# Patient Record
Sex: Female | Born: 1935 | Race: White | Hispanic: No | Marital: Married | Smoking: Never smoker
Health system: Southern US, Community
[De-identification: ages and names within clinical notes are randomized; demographics above are authoritative.]

## PROBLEM LIST (undated history)

## (undated) DIAGNOSIS — R569 Unspecified convulsions: Secondary | ICD-10-CM

## (undated) DIAGNOSIS — F329 Major depressive disorder, single episode, unspecified: Secondary | ICD-10-CM

## (undated) DIAGNOSIS — F419 Anxiety disorder, unspecified: Secondary | ICD-10-CM

## (undated) DIAGNOSIS — Z9289 Personal history of other medical treatment: Secondary | ICD-10-CM

## (undated) DIAGNOSIS — H3530 Unspecified macular degeneration: Secondary | ICD-10-CM

## (undated) DIAGNOSIS — R059 Cough, unspecified: Secondary | ICD-10-CM

## (undated) DIAGNOSIS — M1990 Unspecified osteoarthritis, unspecified site: Secondary | ICD-10-CM

## (undated) DIAGNOSIS — I10 Essential (primary) hypertension: Secondary | ICD-10-CM

## (undated) DIAGNOSIS — K219 Gastro-esophageal reflux disease without esophagitis: Secondary | ICD-10-CM

## (undated) DIAGNOSIS — K5790 Diverticulosis of intestine, part unspecified, without perforation or abscess without bleeding: Secondary | ICD-10-CM

## (undated) DIAGNOSIS — R05 Cough: Secondary | ICD-10-CM

## (undated) DIAGNOSIS — I739 Peripheral vascular disease, unspecified: Secondary | ICD-10-CM

## (undated) DIAGNOSIS — F32A Depression, unspecified: Secondary | ICD-10-CM

## (undated) DIAGNOSIS — G629 Polyneuropathy, unspecified: Secondary | ICD-10-CM

## (undated) DIAGNOSIS — M2550 Pain in unspecified joint: Secondary | ICD-10-CM

## (undated) DIAGNOSIS — G5790 Unspecified mononeuropathy of unspecified lower limb: Secondary | ICD-10-CM

## (undated) DIAGNOSIS — I2510 Atherosclerotic heart disease of native coronary artery without angina pectoris: Secondary | ICD-10-CM

## (undated) DIAGNOSIS — Z8709 Personal history of other diseases of the respiratory system: Secondary | ICD-10-CM

## (undated) DIAGNOSIS — I779 Disorder of arteries and arterioles, unspecified: Secondary | ICD-10-CM

## (undated) DIAGNOSIS — E785 Hyperlipidemia, unspecified: Secondary | ICD-10-CM

## (undated) HISTORY — DX: Atherosclerotic heart disease of native coronary artery without angina pectoris: I25.10

## (undated) HISTORY — DX: Unspecified mononeuropathy of unspecified lower limb: G57.90

## (undated) HISTORY — DX: Essential (primary) hypertension: I10

## (undated) HISTORY — PX: CARDIAC CATHETERIZATION: SHX172

## (undated) HISTORY — DX: Disorder of arteries and arterioles, unspecified: I77.9

## (undated) HISTORY — PX: COLONOSCOPY: SHX174

## (undated) HISTORY — PX: PARTIAL HYSTERECTOMY: SHX80

## (undated) HISTORY — DX: Hyperlipidemia, unspecified: E78.5

## (undated) HISTORY — DX: Peripheral vascular disease, unspecified: I73.9

## (undated) HISTORY — PX: CATARACT EXTRACTION: SUR2

## (undated) HISTORY — PX: CORONARY ANGIOPLASTY: SHX604

---

## 1999-03-13 ENCOUNTER — Encounter: Payer: Self-pay | Admitting: *Deleted

## 1999-03-13 ENCOUNTER — Inpatient Hospital Stay (HOSPITAL_COMMUNITY): Admission: AD | Admit: 1999-03-13 | Discharge: 1999-03-17 | Payer: Self-pay | Admitting: *Deleted

## 2000-02-01 ENCOUNTER — Inpatient Hospital Stay (HOSPITAL_COMMUNITY): Admission: EM | Admit: 2000-02-01 | Discharge: 2000-02-03 | Payer: Self-pay | Admitting: *Deleted

## 2000-02-03 ENCOUNTER — Encounter: Payer: Self-pay | Admitting: *Deleted

## 2002-09-27 HISTORY — PX: KNEE ARTHROPLASTY: SHX992

## 2003-09-06 ENCOUNTER — Inpatient Hospital Stay (HOSPITAL_BASED_OUTPATIENT_CLINIC_OR_DEPARTMENT_OTHER): Admission: RE | Admit: 2003-09-06 | Discharge: 2003-09-06 | Payer: Self-pay | Admitting: *Deleted

## 2004-04-13 ENCOUNTER — Inpatient Hospital Stay (HOSPITAL_COMMUNITY): Admission: RE | Admit: 2004-04-13 | Discharge: 2004-04-17 | Payer: Self-pay | Admitting: Neurological Surgery

## 2004-09-30 ENCOUNTER — Ambulatory Visit: Payer: Self-pay | Admitting: Cardiology

## 2004-10-02 ENCOUNTER — Encounter: Admission: RE | Admit: 2004-10-02 | Discharge: 2004-10-02 | Payer: Self-pay | Admitting: Neurological Surgery

## 2004-10-23 ENCOUNTER — Ambulatory Visit: Payer: Self-pay | Admitting: Cardiology

## 2004-11-06 ENCOUNTER — Ambulatory Visit: Payer: Self-pay | Admitting: Cardiology

## 2005-04-15 ENCOUNTER — Ambulatory Visit: Payer: Self-pay | Admitting: Cardiology

## 2005-11-19 ENCOUNTER — Ambulatory Visit: Payer: Self-pay | Admitting: Cardiology

## 2006-12-15 ENCOUNTER — Ambulatory Visit: Payer: Self-pay | Admitting: Cardiology

## 2006-12-15 ENCOUNTER — Encounter: Payer: Self-pay | Admitting: Physician Assistant

## 2006-12-27 ENCOUNTER — Encounter: Payer: Self-pay | Admitting: Cardiology

## 2007-03-28 ENCOUNTER — Ambulatory Visit: Payer: Self-pay | Admitting: Cardiology

## 2007-05-09 ENCOUNTER — Ambulatory Visit: Payer: Self-pay | Admitting: Cardiology

## 2008-02-29 ENCOUNTER — Ambulatory Visit: Payer: Self-pay | Admitting: Cardiology

## 2008-03-05 ENCOUNTER — Ambulatory Visit: Payer: Self-pay | Admitting: Cardiology

## 2008-07-24 ENCOUNTER — Ambulatory Visit: Payer: Self-pay | Admitting: Cardiology

## 2008-09-27 HISTORY — PX: KIDNEY SURGERY: SHX687

## 2008-11-06 ENCOUNTER — Encounter: Admission: RE | Admit: 2008-11-06 | Discharge: 2008-11-06 | Payer: Self-pay | Admitting: Neurological Surgery

## 2008-11-20 ENCOUNTER — Ambulatory Visit: Payer: Self-pay | Admitting: Cardiology

## 2009-02-26 ENCOUNTER — Encounter: Payer: Self-pay | Admitting: Cardiology

## 2009-03-07 ENCOUNTER — Encounter: Payer: Self-pay | Admitting: Cardiology

## 2009-05-23 ENCOUNTER — Encounter (INDEPENDENT_AMBULATORY_CARE_PROVIDER_SITE_OTHER): Payer: Self-pay | Admitting: *Deleted

## 2009-05-23 DIAGNOSIS — I10 Essential (primary) hypertension: Secondary | ICD-10-CM | POA: Insufficient documentation

## 2009-05-23 DIAGNOSIS — I2510 Atherosclerotic heart disease of native coronary artery without angina pectoris: Secondary | ICD-10-CM | POA: Insufficient documentation

## 2009-05-23 DIAGNOSIS — E785 Hyperlipidemia, unspecified: Secondary | ICD-10-CM | POA: Insufficient documentation

## 2009-05-23 DIAGNOSIS — I739 Peripheral vascular disease, unspecified: Secondary | ICD-10-CM | POA: Insufficient documentation

## 2009-08-18 ENCOUNTER — Telehealth (INDEPENDENT_AMBULATORY_CARE_PROVIDER_SITE_OTHER): Payer: Self-pay | Admitting: *Deleted

## 2009-11-19 ENCOUNTER — Encounter: Payer: Self-pay | Admitting: Cardiology

## 2009-11-24 ENCOUNTER — Ambulatory Visit: Payer: Self-pay | Admitting: Cardiology

## 2010-07-16 ENCOUNTER — Ambulatory Visit: Payer: Self-pay | Admitting: Cardiology

## 2010-07-20 ENCOUNTER — Telehealth (INDEPENDENT_AMBULATORY_CARE_PROVIDER_SITE_OTHER): Payer: Self-pay | Admitting: *Deleted

## 2010-08-15 ENCOUNTER — Encounter: Payer: Self-pay | Admitting: Physician Assistant

## 2010-08-18 ENCOUNTER — Telehealth (INDEPENDENT_AMBULATORY_CARE_PROVIDER_SITE_OTHER): Payer: Self-pay | Admitting: *Deleted

## 2010-09-04 ENCOUNTER — Encounter (INDEPENDENT_AMBULATORY_CARE_PROVIDER_SITE_OTHER): Payer: Self-pay | Admitting: *Deleted

## 2010-09-04 ENCOUNTER — Ambulatory Visit: Payer: Self-pay | Admitting: Cardiology

## 2010-09-08 ENCOUNTER — Encounter: Payer: Self-pay | Admitting: Physician Assistant

## 2010-09-10 ENCOUNTER — Ambulatory Visit: Payer: Self-pay | Admitting: Cardiology

## 2010-09-30 ENCOUNTER — Encounter (INDEPENDENT_AMBULATORY_CARE_PROVIDER_SITE_OTHER): Payer: Self-pay | Admitting: *Deleted

## 2010-10-27 NOTE — Assessment & Plan Note (Signed)
Summary: 6 mo fu pr sept reminder-srs   Visit Type:  Follow-up Primary Provider:  Dr. Lizbeth Bark   History of Present Illness: the patient is an 77 old female with a history of coronary artery disease, status post stent of right coronary artery in 2000.she also had normal LV function and a negative cardiolite  study in 2008. From a cardiovascular standpoint she has been doing quite well. She was placed on Imdur during her last office visit and has noticed no recurrent chest pain. She has no shortness of breath on exertion she is very active in her household andhas  to take care of her husband with severe dementia. She has no palpitations or syncope. She reports no other cardiovascular related problems.  Preventive Screening-Counseling & Management  Alcohol-Tobacco     Smoking Status: never  Current Medications (verified): 1)  Lisinopril 10 Mg Tabs (Lisinopril) .... Take 1 Tablet By Mouth Once A Day 2)  Lovastatin 20 Mg Tabs (Lovastatin) .... Take One Tablet By Mouth Daily At Bedtime 3)  Isosorbide Mononitrate Cr 60 Mg Xr24h-Tab (Isosorbide Mononitrate) .... Take 1 Tablet By Mouth Once A Day  (Place On File) 4)  Metoprolol Succinate 25 Mg Xr24h-Tab (Metoprolol Succinate) .... Take 1/2 Tablet By Mouth Two Times A Day 5)  Aspirin Ec 325 Mg Tbec (Aspirin) .... Take One Tablet By Mouth Daily 6)  Prilosec 20 Mg Cpdr (Omeprazole) .... Take 1 Tablet By Mouth Once A Day 7)  Tegretol Xr 200 Mg Xr12h-Tab (Carbamazepine) .... Take 1 Tablet By Mouth Once A Day 8)  Ibuprofen 200 Mg Tabs (Ibuprofen) .... As Needed 9)  Nitroglycerin 0.4 Mg Subl (Nitroglycerin) .... One Tablet Under Tongue Every 5 Minutes As Needed For Chest Pain---May Repeat Times Three 10)  Vitamin B-12 250 Mcg Tabs (Cyanocobalamin) .... Take 1 Tablet By Mouth Once A Day 11)  Multivitamins  Tabs (Multiple Vitamin) .... Take 1 Tablet By Mouth Once A Day 12)  Chlorthalidone 25 Mg Tabs (Chlorthalidone) .... Take 1 Tablet By Mouth Once A  Day  Allergies (verified): No Known Drug Allergies  Comments:  Nurse/Medical Assistant: The patient's medication list and allergies were reviewed with the patient and were updated in the Medication and Allergy Lists.  Past History:  Family History: Last updated: 05/23/2009 Family History of Diabetes:  Family History of Hypertension:   Social History: Last updated: 05/23/2009 Tobacco Use - No.  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    Vital Signs:  Patient profile:   77 year old female Height:      63 inches Weight:      181 pounds BMI:     32.18 Pulse rate:   69 / minute BP sitting:  176 / 81  (left arm) Cuff size:   regular  Vitals Entered By: Georgina Peer (July 16, 2010 8:23 AM)  Serial Vital Signs/Assessments:  Time      Position  BP       Pulse  Resp  Temp     By 8:26 AM             160/72   69                    Georgina Peer  Comments: 8:26 AM right arm/reg  cuff By: Georgina Peer    Physical Exam  Additional Exam:  General: Well-developed, well-nourished in no distress head: Normocephalic and atraumatic eyes PERRLA/EOMI intact, conjunctiva and lids normal nose: No deformity or lesions mouth normal dentition, normal posterior pharynx neck: Supple, no JVD.  No masses, thyromegaly or abnormal cervical nodes lungs: Normal breath sounds bilaterally without wheezing.  Normal percussion heart: regular rate and rhythm with normal S1 and S2, no S3 or S4.  PMI is normal.  No pathological murmurs abdomen: Normal bowel sounds, abdomen is soft and nontender without masses, organomegaly or hernias noted.  No hepatosplenomegaly musculoskeletal: Back normal, normal gait muscle strength and tone normal pulsus: Pulse is normal in all 4 extremities Extremities: No peripheral pitting edema neurologic: Alert and oriented x 3 skin: Intact without lesions or rashes cervical nodes: No significant adenopathy psychologic: Normal affect    Impression & Recommendations:  Problem # 1:  CAD, NATIVE VESSEL (ICD-414.01) the patient has no recurrent chest pain. She has done well and Imdur. There is no indication for further stress testing. Her updated medication list for this problem includes:    Lisinopril 10 Mg Tabs (Lisinopril) .Marland Kitchen... Take 1 tablet by mouth once a day    Isosorbide Mononitrate Cr 60 Mg Xr24h-tab (Isosorbide mononitrate) .Marland Kitchen... Take 1 tablet by mouth once a day  (place on file)    Metoprolol Succinate 25 Mg Xr24h-tab (Metoprolol succinate) .Marland Kitchen... Take 1/2 tablet by mouth two times a day    Aspirin Ec 325 Mg Tbec (Aspirin) .Marland Kitchen... Take one tablet by mouth daily    Nitroglycerin 0.4 Mg Subl (Nitroglycerin) ..... One tablet under tongue every 5 minutes as needed for chest pain---may repeat times three  Problem # 2:  HYPERLIPIDEMIA-MIXED (ICD-272.4) patient has blood work done with her primary care physician. Including LFTs and lipid management is  handled by her primary care physician Her updated medication list for this problem includes:    Lovastatin 20 Mg Tabs (Lovastatin) .Marland Kitchen... Take one tablet by mouth daily at bedtime  Problem # 3:  HYPERTENSION, UNSPECIFIED (ICD-401.9) blood pressure remains elevated. I discussed with the patient that it's important to add antihypertensive therapy. I will start her on chlorthalidone 25 mg p.o. q. daily Her updated medication list for this problem includes:    Lisinopril 10 Mg Tabs (Lisinopril) .Marland Kitchen... Take 1 tablet by mouth once a day    Metoprolol Succinate 25 Mg Xr24h-tab (Metoprolol succinate) .Marland Kitchen... Take 1/2 tablet by mouth two times a day    Aspirin Ec 325 Mg Tbec (Aspirin) .Marland Kitchen... Take one tablet by mouth daily    Chlorthalidone 25 Mg Tabs (Chlorthalidone) .Marland Kitchen... Take 1 tablet by mouth once a day  Patient Instructions: 1)  Chlorthalidone 25mg  daily 2)  Follow up in  6 months Prescriptions: CHLORTHALIDONE 25 MG TABS (CHLORTHALIDONE) Take 1 tablet by mouth once a day  #30 x 6   Entered by:   Lovina Reach, LPN  Authorized by:   Terald Sleeper, MD, [facility]   Signed by:   Lovina Reach, LPN on [id number]   Method used:   Electronically to        [facility] (retail)       [address] [place]  [address]       Ph: [phone]       Fax: [phone]   [id number]   Handout requested.

## 2010-10-27 NOTE — Letter (Signed)
Summary: Adult nurse at [facility]. [address]   [place], [place] [address]   Phone: [phone]  Fax: [phone]      [place]  Your doctor has ordered a Cardiolite Stress Test to help determine the condition of your heart during stress. If you take blood pressure medicine, ask your doctor if you should take it the day of your test. You should not have anything to eat or drink at least 4 hours before your test is scheduled, and no caffeine (coffee, tea, decaf. or chocolate) for 24 hours before your test.   You will need to register at the Outpatient/Main Entrance at the hospital 30 minutes before your appointment time. It is a good idea to bring a copy of your order with you. They will direct you to the Diagnostic Imaging (Radiology) Department.  You will be asked to undress from the waist up and be given a hospital gown to wear, so dress comfortably from the waist down, for example:    Sweat pants, shorts or skirt   Rubber-soled lace up shoes (i.e. tennis shoes)  Plan on about three hours from registration to release from the hospital.    Date of Test:              Time of Test              Hold metoprolol (Toprol) for 24 hours before test. You may take your remaining medications with water the morning of your test.

## 2010-10-27 NOTE — Progress Notes (Signed)
Summary: ER visit - poss. need echo  Phone Note Call from Patient   Summary of Call: Had ER visit for left arm pain.  Wanted her to come back for stress test on Monday, but when she called [facility] no one knew anything about this.  Stated  that she would just wait to see [place] before deciding if she needed. Will scan in ER notes.  Initial call taken by: Lovina Reach, LPN,  November 22, [id number] 2:51 PM

## 2010-10-27 NOTE — Progress Notes (Signed)
Summary: vomiting  Phone Note Call from Patient Call back at Home Phone [phone]   Action Taken: Patient advised to go to ER Summary of Call: Pt states MD gave new prescription at office visit on 10/20. She states she thinks it's making her sick on her stomach. She states she takes aroudn 9am. She states in the afternoon she gets sick to her stomach and starts vomiting. She states didn't vomit Sat but she didn't do anything. She states she layed around and didn't do anything. She states yesterday she started vomiting in the afternoon. Also taking a medicine, Pramipexole, for restless leg syndrome now. She states she started this medication the same day as the Chlortalidone. She states she is taking both of these medications in the mornings. She will also d/w primary MD.  Initial call taken by: Gurney Maxin, RN, BSN,  July 20, 2010 4:01 PM  Follow-up for Phone Call        Unlikely chlortalidone  cause. Suspect Mirapex is cause, commenely associated with N/V Follow-up by: Terald Sleeper, MD, [facility],  July 20, 2010 5:12 PM  Additional Follow-up for Phone Call Additional follow up Details #1::        Pt notified and verbalized understanding.  Additional Follow-up by: Gurney Maxin, RN, BSN,  July 21, 2010 9:28 AM

## 2010-10-27 NOTE — Assessment & Plan Note (Signed)
Summary: ED f/u -  wanted echo done  --agh   Visit Type:  Follow-up Primary Provider:  Dr. Lizbeth Bark   History of Present Illness: patient seen in clinic, following recent ED visit.  She presented with LUE discomfort, a new symptom for her, which had been ongoing for one week. It was not strictly correlated with exertion. There was no associated anterior chest discomfort. 2 sets of enzymes normal; EKG indicated first-degree AV block with no acute changes. Chest x-ray was benign.  Medication adjustments were made, but patient reports no recurrent symptoms since her visit. She denies any significant dyspnea. She was, however, concerned about this arm pain, which did not extend beyond the elbow. She denied any recent trauma. 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  Preventive Screening-Counseling & Management  Alcohol-Tobacco     Smoking Status: never  Current Medications (verified): 1)  Lisinopril 10 Mg Tabs (Lisinopril) .... Take 1 Tablet By Mouth Once A Day 2)  Lovastatin 20 Mg Tabs (Lovastatin) .... Take One Tablet By Mouth Daily At Bedtime 3)  Isosorbide Mononitrate Cr 60 Mg Xr24h-Tab (Isosorbide Mononitrate) .... Take 1 Tablet By Mouth Once A Day  (Place On File) 4)  Metoprolol Succinate 25 Mg Xr24h-Tab (Metoprolol Succinate) .... Take 1/2 Tablet By Mouth Two Times A Day 5)  Aspirin Ec 325 Mg Tbec (Aspirin) .... Take One Tablet By Mouth Daily 6)  Prilosec 20 Mg Cpdr (Omeprazole) .... Take 1 Tablet By Mouth Once A Day 7)  Tegretol Xr 200 Mg Xr12h-Tab (Carbamazepine) .... Take 1 Tablet By Mouth Once A Day 8)  Ibuprofen 200 Mg Tabs (Ibuprofen) .... As Needed 9)  Nitroglycerin 0.4 Mg Subl (Nitroglycerin) .... One Tablet Under Tongue Every 5 Minutes As Needed For Chest Pain---May Repeat Times Three 10)  Vitamin B-12 250 Mcg Tabs (Cyanocobalamin) .... Take 1 Tablet By Mouth Once A Day 11)  Multivitamins  Tabs (Multiple Vitamin) .... Take 1 Tablet By Mouth  Once A Day 12)  Chlorthalidone 25 Mg Tabs (Chlorthalidone) .... Take 1 Tablet By Mouth Once A Day  Allergies (verified): No Known Drug Allergies  Comments:  Nurse/Medical Assistant: The patient is currently on medications but does not know the name or dosage at this time. Instructed to contact our office with details. Will update medication list at that time.  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 All other systems negative.   Vital Signs:  Patient profile:   77 year old female Height:      63 inches Weight:      181 pounds Pulse rate:   76 / minute BP sitting:   146 / 72  (left arm) Cuff size:   regular  Vitals Entered By: Georgina Peer (September 04, 2010 1:08 PM)  Physical Exam  Additional Exam:  GEN: 77 year old female, no distress HEENT: NCAT,PERRLA,EOMI NECK: palpable pulses, no bruits; no JVD; no TM LUNGS: CTA bilaterally HEART: RRR (S1S2); no significant murmurs; no rubs; no gallops ABD: soft, NT; intact BS EXT:  intact distal pulses; no edema SKIN: warm, dry MUSC: no obvious deformity NEURO: A/O (x3)   Job   Impression & Recommendations:  Problem # 1:  CAD, NATIVE VESSEL (ICD-414.01)  Will schedule an exercise stress Cardiolite next week, to rule out occult ischemia. She recently presented with new LUE discomfort, possible anginal equivalent. This was dissimilar from her  presenting symptoms in 2000, when she underwent stenting of the RCA. She has history of normal LV EF, and had a negative Cardiolite in 2008. We'll arrange for early clinic follow up with myself and Dr. Dannielle Burn. Of note, we will hold beta blocker in a.m. of test, to insure adequate heart rate.  Problem # 2:  HYPERLIPIDEMIA-MIXED (ICD-272.4)  aggressive lipid management recommended with target LDL 70 or less, if feasible  Her updated medication list for this problem includes:    Lovastatin 20 Mg Tabs (Lovastatin) .Marland Kitchen... Take one tablet by mouth daily at bedtime  Problem # 3:  HYPERTENSION, UNSPECIFIED (ICD-401.9) Assessment: Comment Only  Problem # 4:  PVD (ICD-443.9)  history of occluded right vertebral artery  Other Orders: Nuclear Med ([place])  Patient Instructions: 1)  Follow up with Dr. Lutricia Feil on Thursday, Sep 10, 2010 at 10:45 am. 2)  Your physician has requested that you have an exercise stress cardiolite.  For further information please visit [email or web address].  Please follow instruction sheet, as given. 3)  Your physician recommends that you continue on your current medications as directed. Please refer to the Current Medication list given to you today.  Appended Document: [facility]     Allergies: No Known Drug Allergies   Other Orders: T-Lipid Profile [id number]) T-Hepatic Function [phone])  Patient Instructions: 1)  Follow up with Dr. Lutricia Feil on Thursday, September 10, 2010 at 2:30pm. 2)  Your physician has requested that you have an exercise stress cardiolite.  For further information please visit [email or web address].  Please follow instruction sheet, as given. 3)  Your physician recommends that you go to the [facility] for a FASTING lipid profile and liver function labs:  Do not eat or drink after midnight.

## 2010-10-27 NOTE — Assessment & Plan Note (Signed)
Summary: 1 yr fu per feb reminder-srs   Visit Type:  Follow-up Primary Provider:  Dr. Lizbeth Bark  CC:  follow-up visit.  History of Present Illness: the patient is unremarkable 77 year old female with coronary artery disease, status post stent to the right coronary artery in 2000. She has normal LV function and had a negative Cardiolite study in 2008.  The patient has been bothered by cold symptoms for the last 3 weeks and has been on multiple antibiotics. She continues to complain of hoarseness and frequent coughing spells. Associated with this she has experienced some soreness in the chest, but without any frank exertional chest pain. The patient did say that on occasion she took nitroglycerin which seemed to improve her symptoms but she's not sure if there would have resolved spontaneously. She also reports of some pain that shoots up in her neck intermittently.  The patient also is status post partial nephrectomy for what appears to be a complicated renal cyst although it's not clear if she had a malignancy. She still is complaining of some pain in the right flank.  Preventive Screening-Counseling & Management  Alcohol-Tobacco     Smoking Status: never  Clinical Review Panels:  CXR CXR results Cardiac leads project over the chest.  Heart size is         within normal limits for portable technique and stable.         Atherosclerotic calcification of the thoracic aortic arch.         Thoracic aortic contour is within normal limits and stable.         Pulmonary vascularity is normal.  Mild prominence of interstitial         lung markings is stable.  No focal airspace disease, effusion,         edema, or pneumothorax is identified.  Mild pleural thickening at         the right lung apex is stable.                   IMPRESSION:         Stable appearance of the chest.  No evidence of acute         cardiopulmonary disease. (07/13/2008)  Echocardiogram Echocardiogram 1. The estimated  ejection fraction is 55-60%.          2. Mild concentric left ventricular hypertrophy is observed.         3. There is mitral annular calcification.         4. There is mild mitral regurgitation.          5. The pericardium appears normal.         6. The inferior vena cava appears normal. (03/05/2008)  Carotid Studies Carotid Doppler Results 1. Very minimal atherosclerotic plaque at the left carotid         bulb and proximal internal carotid artery.  No hemodynamically         significant stenosis. (03/29/2007)  Vascular Studies Venous Doppler There is complete compressibility of the left common         femoral, superficial femoral, and popliteal veins.  There is         respiratory phasicity and augmentation of flow upon calf         compression on the Doppler exam.                   IMPRESSION:         No evidence of  DVT in the left lower extremity. (03/28/2008)  Cardiac Imaging Cardiac Cath Findings  IMPRESSION:  1. Normal left ventricular systolic function.  2. Patent stent in the right coronary artery.  There is disease in the     ostium of the right coronary artery which is borderline severity.  There     is mild, but nonobstructive disease in the left coronary arteries.   PLAN:  We will proceed with an outpatient adenosine Cardiolite to rule out  ischemia in the right coronary artery distribution.  If there is ischemia on  the scan, the right coronary artery could be treated percutaneously.  If  there is no ischemia on the scan, would recommend medical therapy.                                             Junious Silk, M.D. [facility] (09/06/2003)    Current Medications (verified): 1)  Lisinopril 10 Mg Tabs (Lisinopril) .... Take 1 Tablet By Mouth Once A Day 2)  Lovastatin 20 Mg Tabs (Lovastatin) .... Take One Tablet By Mouth Daily At Bedtime 3)  Isosorbide Mononitrate Cr 60 Mg Xr24h-Tab (Isosorbide Mononitrate) .... Take 1 Tablet By Mouth Once A Day  (Place On File) 4)   Metoprolol Succinate 50 Mg Xr24h-Tab (Metoprolol Succinate) .... Take 1/2 Tablet By Mouth Two Times A Day 5)  Aspirin Ec 325 Mg Tbec (Aspirin) .... Take One Tablet By Mouth Daily 6)  Prilosec 20 Mg Cpdr (Omeprazole) .... Take 1 Tablet By Mouth Once A Day 7)  Tegretol Xr 200 Mg Xr12h-Tab (Carbamazepine) .... Take 1 Tablet By Mouth Once A Day 8)  Ibuprofen 200 Mg Tabs (Ibuprofen) .... As Needed 9)  Nitroglycerin 0.4 Mg Subl (Nitroglycerin) .... One Tablet Under Tongue Every 5 Minutes As Needed For Chest Pain---May Repeat Times Three  Allergies (verified): No Known Drug Allergies  Comments:  Nurse/Medical Assistant: The patient's medications were reviewed with the patient and were updated in the Medication List. Pt brought a list of medications to office visit. Pt is not sure about dose of Lisinopril but will call if this is incorrect. Gurney Maxin, RN, BSN (November 24, 2009 2:17 PM)  Past History:  Family History: Last updated: 05/23/2009 Family History of Diabetes:  Family History of Hypertension:   Social History: Last updated: 05/23/2009 Tobacco Use - No.  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  The patient denies fatigue, malaise, fever, weight gain/loss, vision loss, decreased hearing, hoarseness, palpitations, shortness of breath, prolonged cough, wheezing, sleep apnea, coughing up blood, abdominal pain, blood in stool, nausea, vomiting,  diarrhea, heartburn, incontinence, blood in urine, muscle weakness, joint pain, leg swelling, rash, skin lesions, headache, fainting, dizziness, depression, anxiety, enlarged lymph nodes, easy bruising or bleeding, and environmental allergies.    Vital Signs:  Patient profile:   77 year old female Height:      63 inches Weight:  173.75 pounds Pulse rate:   76 / minute BP sitting:   168 / 67  (left arm) Cuff size:   regular  Vitals Entered By: Gurney Maxin, RN, BSN (November 24, 2009 2:08 PM) CC: follow-up visit   Physical Exam  Additional Exam:  General: Well-developed, well-nourished in no distress head: Normocephalic and atraumatic eyes PERRLA/EOMI intact, conjunctiva and lids normal nose: No deformity or lesions mouth normal dentition, normal posterior pharynx neck: Supple, no JVD.  No masses, thyromegaly or abnormal cervical nodes lungs: Normal breath sounds bilaterally without wheezing.  Normal percussion heart: regular rate and rhythm with normal S1 and S2, no S3 or S4.  PMI is normal.  No pathological murmurs abdomen: Normal bowel sounds, abdomen is soft and nontender without masses, organomegaly or hernias noted.  No hepatosplenomegaly musculoskeletal: Back normal, normal gait muscle strength and tone normal pulsus: Pulse is normal in all 4 extremities Extremities: No peripheral pitting edema neurologic: Alert and oriented x 3 skin: Intact without lesions or rashes cervical nodes: No significant adenopathy psychologic: Normal affect    Impression & Recommendations:  Problem # 1:  CAD, NATIVE VESSEL (ICD-414.01) the patient status post prior coronary intervention. The patient does report some chest pain. She also has neck pain. There are both typical and atypical features to her pain. Her EKG shows no acute changes. I recommended for the patient to increase her Imdur to 60 mg a day and use p.r.n. nitroglycerin. However if she has unstable symptoms she will need to  notify us or come to the emergency room and she has been made aware of this. Her updated medication list for this problem includes:    Lisinopril 10 Mg Tabs (Lisinopril) .Marland Kitchen... Take 1 tablet by mouth once a day    Isosorbide Mononitrate Cr 60 Mg Xr24h-tab (Isosorbide mononitrate) .Marland Kitchen... Take 1 tablet by mouth once a day  (place on file)    Metoprolol Succinate 50 Mg Xr24h-tab (Metoprolol succinate) .Marland Kitchen... Take 1/2 tablet by mouth two times a day    Aspirin Ec 325 Mg Tbec (Aspirin) .Marland Kitchen... Take one tablet by mouth daily    Nitroglycerin 0.4 Mg Subl (Nitroglycerin) ..... One tablet under tongue every 5 minutes as needed for chest pain---may repeat times three  Orders: EKG w/ Interpretation (93000) EKG w/ Interpretation (93000)  Problem # 2:  HYPERTENSION, UNSPECIFIED (ICD-401.9) blood pressure slightly elevated. With increasedImdur but may also need to increase lisinopril blood pressure remains elevated Her updated medication list for this problem includes:    Lisinopril 10 Mg Tabs (Lisinopril) .Marland Kitchen... Take 1 tablet by mouth once a day    Metoprolol Succinate 50 Mg Xr24h-tab (Metoprolol succinate) .Marland Kitchen... Take 1/2 tablet by mouth two times a day    Aspirin Ec 325 Mg Tbec (Aspirin) .Marland Kitchen... Take one tablet by mouth daily  Problem # 3:  HYPERLIPIDEMIA-MIXED (ICD-272.4) the patient is on a statin Her updated medication list for this problem includes:    Lovastatin 20 Mg Tabs (Lovastatin) .Marland Kitchen... Take one tablet by mouth daily at bedtime  Patient Instructions: 1)  Increase Imdur to 60mg  daily 2)  Follow up in  6 months.   Prescriptions: ISOSORBIDE MONONITRATE CR 60 MG XR24H-TAB (ISOSORBIDE MONONITRATE) Take 1 tablet by mouth once a day  (place on file)  #30 x 6   Entered by:   Lovina Reach, LPN   Authorized by:   Terald Sleeper, MD, [facility]   Signed by:   Lovina Reach, LPN on [id number]   Method used:  Electronically to        [facility] (retail)       [address]       [place]  [address]       Ph: [phone]       Fax: [phone]   RxID:   [id number]

## 2010-10-29 NOTE — Assessment & Plan Note (Signed)
Summary: 1 WK F/U PER 12/9 OV W/GENE-JM   Visit Type:  Follow-up Primary Provider:  Dr. Lizbeth Bark   History of Present Illness: the patient is a very pleasant 77 year old female with a history of coronary stent placement in 2000 and a follow up cardiac catheterization 2005 nonobstructive coronary artery disease. She was recently seen in the office with some atypical chest pain and left upper arm pain. She was referred for Cardiolite study which was within normal limits. The patient has been doing well she denies any chest pain orthopnea PND. She is on a lot of stress because her husband who has significant dementia. She denies any palpitations or syncope and is otherwise stable from a cardiovascular perspective  Preventive Screening-Counseling & Management  Alcohol-Tobacco     Smoking Status: never  Current Medications (verified): 1)  Lisinopril 10 Mg Tabs (Lisinopril) .... Take 1 Tablet By Mouth Once A Day 2)  Lovastatin 20 Mg Tabs (Lovastatin) .... Take One Tablet By Mouth Daily At Bedtime 3)  Isosorbide Mononitrate Cr 60 Mg Xr24h-Tab (Isosorbide Mononitrate) .... Take 1 Tablet By Mouth Once A Day  (Place On File) 4)  Metoprolol Succinate 25 Mg Xr24h-Tab (Metoprolol Succinate) .... Take 1/2 Tablet By Mouth Two Times A Day 5)  Aspirin Ec 325 Mg Tbec (Aspirin) .... Take One Tablet By Mouth Daily 6)  Prilosec 20 Mg Cpdr (Omeprazole) .... Take 1 Tablet By Mouth Once A Day 7)  Tegretol Xr 200 Mg Xr12h-Tab (Carbamazepine) .... Take 1 Tablet By Mouth Once A Day 8)  Ibuprofen 200 Mg Tabs (Ibuprofen) .... As Needed 9)  Nitroglycerin 0.4 Mg Subl (Nitroglycerin) .... One Tablet Under Tongue Every 5 Minutes As Needed For Chest Pain---May Repeat Times Three 10)  Vitamin B-12 250 Mcg Tabs (Cyanocobalamin) .... Take 1 Tablet By Mouth Once A Day 11)  Multivitamins  Tabs (Multiple Vitamin) .... Take 1 Tablet By Mouth Once A Day 12)  Chlorthalidone 25 Mg Tabs (Chlorthalidone) .... Take 1 Tablet By Mouth  Once A Day  Allergies (verified): No Known Drug Allergies  Comments:  Nurse/Medical Assistant: The patient is currently on medications but does not know the name or dosage at this time. Instructed to contact our office with details. Will update medication list at that time.  Past History:  Family History: Last updated: 05/23/2009 Family History of Diabetes:  Family History of Hypertension:   Social History: Last updated: 05/23/2009 Tobacco Use - No.  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 Pulmonary nodules Exercise Cardiolite study 4.6 mets, normal LV perfusion with an ejection fraction of 78% September 08, 2010 Triglycerides 251 cholesterol 191 HDL cholesterol 45 LDL cholesterol 96 September 08, 2010    Review of Systems  The patient denies fatigue, malaise, fever, weight gain/loss, vision loss, decreased hearing, hoarseness, chest pain, palpitations, shortness of breath, prolonged cough, wheezing, sleep apnea, coughing up blood, abdominal pain, blood in stool, nausea, vomiting, diarrhea, heartburn, incontinence, blood in urine, muscle weakness, joint pain, leg swelling, rash, skin lesions, headache, fainting, dizziness, depression, anxiety, enlarged lymph nodes, easy bruising or bleeding, and environmental allergies.    Vital Signs:  Patient profile:  77 year old female Height:      63 inches Weight:      180 pounds Pulse rate:   85 / minute BP sitting:   148 / 75  (left arm) Cuff size:   regular  Vitals Entered By:  Georgina Peer (September 10, 2010 2:19 PM)  Physical Exam  Additional Exam:  GEN: 77 year old female, no distress HEENT: NCAT,PERRLA,EOMI NECK: palpable pulses, no bruits; no JVD; no TM LUNGS: CTA bilaterally HEART: RRR (S1S2); no significant murmurs; no rubs; no gallops ABD: soft, NT; intact BS EXT: intact distal pulses; no edema SKIN: warm, dry MUSC: no obvious deformity NEURO: A/O (x3)   Job   Impression & Recommendations:  Problem # 1:  CAD, NATIVE VESSEL (ICD-414.01) stable no recurrent chest pain. Cardiolite study within normal limits Her updated medication list for this problem includes:    Lisinopril 10 Mg Tabs (Lisinopril) .Marland Kitchen... Take 1 tablet by mouth once a day    Isosorbide Mononitrate Cr 60 Mg Xr24h-tab (Isosorbide mononitrate) .Marland Kitchen... Take 1 tablet by mouth once a day  (place on file)    Metoprolol Succinate 25 Mg Xr24h-tab (Metoprolol succinate) .Marland Kitchen... Take 1/2 tablet by mouth two times a day    Aspirin Ec 325 Mg Tbec (Aspirin) .Marland Kitchen... Take one tablet by mouth daily    Nitroglycerin 0.4 Mg Subl (Nitroglycerin) ..... One tablet under tongue every 5 minutes as needed for chest pain---may repeat times three  Problem # 2:  HYPERLIPIDEMIA-MIXED (ICD-272.4) lipid panel at goal with an LDL of 96 continue lovastatin at current dose. Her updated medication list for this problem includes:    Lovastatin 20 Mg Tabs (Lovastatin) .Marland Kitchen... Take one tablet by mouth daily at bedtime  Problem # 3:  HYPERTENSION, UNSPECIFIED (ICD-401.9) blood pressure well controlled systolic blood pressure slightly high, but at this point I do not think further intervention is needed. This is just a single reading. Her updated medication list for this problem includes:    Lisinopril 10 Mg Tabs (Lisinopril) .Marland Kitchen... Take 1 tablet by mouth once a day    Metoprolol Succinate 25 Mg Xr24h-tab (Metoprolol succinate) .Marland Kitchen... Take 1/2 tablet by mouth two times a day    Aspirin Ec 325 Mg Tbec (Aspirin) .Marland Kitchen... Take one  tablet by mouth daily    Chlorthalidone 25 Mg Tabs (Chlorthalidone) .Marland Kitchen... Take 1 tablet by mouth once a day  Patient Instructions: 1)  Your physician recommends that you continue on your current medications as directed. Please refer to the Current Medication list given to you today. 2)  Follow up in  6 months

## 2010-10-29 NOTE — Letter (Signed)
Summary: Risk analyst at [facility]. [address]   [place], [place] [address]   Phone: [phone]  Fax: [phone]        September 30, 2010 MRN: [id number]   Carrie Gillespie [address] [place]  [address]   Dear Ms. Brant,  Your test ordered by Rande Lawman has been reviewed by your physician (or physician assistant) and was found to be normal or stable. Your physician (or physician assistant) felt no changes were needed at this time.  ____ Echocardiogram  ____ Cardiac Stress Test  __X__ Lab Work  ____ Peripheral vascular study of arms, legs or neck  ____ CT scan or X-ray  ____ Lung or Breathing test  ____ Other:   Thank you.   Lovina Reach, LPN    Bryon Lions, M.D., F.A.C.C. Maceo Pro, M.D., F.A.C.C. Cammy Copa, M.D., F.A.C.C. Vonda Antigua, M.D., F.A.C.C. Vita Barley, M.D., F.A.C.C. Mare Ferrari, M.D., F.A.C.C. Emi Belfast, PA-C

## 2010-12-26 ENCOUNTER — Other Ambulatory Visit: Payer: Self-pay | Admitting: Cardiology

## 2011-02-09 NOTE — Assessment & Plan Note (Signed)
[facility]                          [facility] OFFICE NOTE   Carrie Gillespie, Carrie Gillespie                       MRN:          [id number]  DATE:11/20/2008                            DOB:          October 16, 1919    REFERRING PHYSICIAN:  Wynelle Cleveland   HISTORY OF PRESENT ILLNESS:  The patient is a very pleasant 77 year old  female with coronary artery disease status post stent to the right  coronary artery in 2000.  The patient has been doing extremely well.  Her exercise tolerance has improved.  She has been diagnosed with  seizures and is currently on Tegretol.  She also had last year a fall  and has had problems with pain in the hips for which she is under  treatment.  She feels much better now; however, after a steroid  injection.  From a cardiovascular standpoint, she is stable.  Her blood  pressure is high in the office and that she assures me that her blood  pressures are completely normal at home.  She is declining to have any  addition to her medical regimen.   MEDICATIONS:  1. Aspirin 325 mg p.o. daily.  2. Prilosec 20 mg p.o. daily.  3. Metoprolol 50 mg half a tablet p.o. b.i.d.  4. Isosorbide 30 mg p.o. daily.  5. Lovastatin 20 mg p.o. daily.  6. Lisinopril 5 mg p.o. daily.  7. Tegretol-XR 100 mg p.o. daily.   PHYSICAL EXAMINATION:  VITAL SIGNS:  Blood pressure 161/84, heart rate  55, weight is 179 pounds.  NECK:  Normal carotid upstroke, no carotid bruits.  LUNGS:  Clear breath sounds bilaterally.  HEART:  Regular rate and rhythm.  Normal S1, S2.  No murmur, rubs, or  gallops.  ABDOMEN:  Soft, nontender.  No rebound, no guarding.  Good bowel sounds.  EXTREMITIES:  No cyanosis, clubbing, or edema.   PROBLEMS:  1. Coronary artery disease, stable.      a.     Status post stent and percutaneous coronary intervention to       the right coronary artery in 2000.      b.     Repeat cath studies, 2005 with nonobstructive disease.      c.      Normal left ventricular function.      d.     Cardiolite study in 2008, negative for ischemia.  2. Dyslipidemia.  3. Hypertension.  According to the patient, controlled.  4. Peripheral vascular disease with minimal internal carotid artery      disease with an occluded right vertebral artery.  5. Pulmonary nodules, followed by CT scan on September 26, 2007, and      followed by Dr. Rhae Lerner.   PLAN:  1. From a cardiac standpoint, the patient is stable.  Sats have much      improved in regards to her stamina.  She tries to exercise with a      small foot bicycle.  She states that she has no chest pain or      shortness of breath.  2. The patient had a Cardiolite  done within the last 3 years, so she      does not need any further ischemia testing in the absence of      symptoms currently.     Carrie Mcmurray, MD,FACC     GED/MedQ  DD: 11/20/2008  DT: 11/20/2008  Job #: [id number]   cc:   Wynelle Cleveland

## 2011-02-09 NOTE — Assessment & Plan Note (Signed)
[facility]                          [facility] NOTE   Carrie Gillespie, Carrie Gillespie                         MRN:          [id number]  DATE:02/29/2008                            DOB:          04/28/1920    HISTORY OF PRESENT ILLNESS:  The patient is an 77 year old female with a  history of coronary artery disease.  The patient is status post stent to  the right coronary artery in 2000.  She has been doing well from a  cardiac standpoint.  She has had no chest pain.  She does have dyspnea,  but feels she is severely deconditioned.  She was admitted recently with  pneumonia, hyponatremia, and secondary seizures.  She also had a fall a  month ago at the [employer], when she tripped.  She denies, however,  any recurrent syncope, palpitations, orthopnea, PND, or chest pain.   MEDICATIONS:  Aspirin 325 mg p.o. daily, Prilosec 20 mg p.o. daily,  metoprolol 50 mg half a tablet p.o. b.i.d., isosorbide 30 mg p.o. daily,  lovastatin 20 mg p.o. daily, and lisinopril 5 mg p.o. daily.   PHYSICAL EXAMINATION:  VITAL SIGNS:  Blood pressure 161/88, heart rate  72, and weight is 174.  GENERAL:  A well-nourished white female in no apparent distress.  HEENT:  Pupils isochoric.  Conjunctivae clear.  NECK:  Supple.  Normal carotid upstroke.  No carotid bruits.  LUNGS:  Clear breath sounds bilaterally.  HEART:  Regular rate and rhythm.  Normal S1 and S2.  No murmurs, rubs,  or gallops.  ABDOMEN:  Soft and nontender with no rebound or guarding and good bowel  sounds.  EXTREMITIES:  No cyanosis, clubbing, or edema.   PROBLEM LIST:  1. Coronary artery disease, stable.      a.     Status post stent and percutaneous coronary intervention to       the right coronary artery in 2000.      b.     Repeat catheterization in 2005 was performed with       nonobstructive disease.      c.     Normal left ventricular function 65%.      d.     No recurrence of chest pain.  2.  Dyslipidemia.  3. Hypertension, poorly controlled.  4. Peripheral vascular disease with minimal internal carotid artery      disease, but with an occluded right vertebral artery.  5. Pulmonary nodules followed by CT scan on September 26, 2007.   PLAN:  1. From a cardiac standpoint, the patient is doing well.  She does      have dyspnea.  We will order an echocardiogram study to reassess      her LV function.  2. The patient's blood pressure control is likely contributing to      chronic diastolic heart failure.      We will increase her lisinopril from 5 to 10 mg p.o. daily.  3. The patient can follow up with [place] in 6 months.  I have reviewed      with her  electrocardiogram today, which is essentially within      normal limits.     Ernestine Mcmurray, MD,FACC  Electronically Signed    GED/MedQ  DD: 02/29/2008  DT: 03/01/2008  Job #: [id number]

## 2011-02-12 NOTE — H&P (Signed)
NAMEKAMILYA, Carrie Gillespie                            ACCOUNT NO.:  [id number]   MEDICAL RECORD NO.:  [id number]                   PATIENT TYPE:  OIB   LOCATION:                                       FACILITY:  [facility]   PHYSICIAN:  Minus Breeding, M.D.                DATE OF BIRTH:  [birth year]   DATE OF ADMISSION:  09/06/2003  DATE OF DISCHARGE:                                HISTORY & PHYSICAL   CHIEF COMPLAINT:  Chest pain.   HISTORY OF PRESENT ILLNESS:  A very pleasant 77 year old white female with  coronary artery disease.  The patient had an 80% right coronary artery  lesion that was stented in 2000.  She had a repeat heart catheterization in  2001 which showed a 60% ostial right coronary artery stenosis followed by a  20 to 30% in-stent stenosis.  The LAD had a 40% proximal and 25% mid  stenosis.  The first diagonal had a 30% proximal stenosis.  Ramus  intermediate had a 40% stenosis with an EF of 60%.  Follow-up Cardiolite was  done without any evidence of ischemia.   She has done well until this last Friday night when she had sudden onset at  rest of substernal chest discomfort which was described as a heaviness, a  tightness, and a burning which radiated up into her neck. She describes it  as someone sitting on her chest.  She had associated shortness of breath  with mild diaphoresis.  Symptoms lasted about 3-1/2 hours total.  She did  not use any nitroglycerin.  She presents now and tells me that she is due to  have a total knee replacement in December in about two weeks.   I discussed this with Dr. Percival Spanish and we felt that catheterization versus  repeat nuclear study with her known coronary artery disease and risk factors  was the best option.   CURRENT MEDICATIONS:  1. Altace 5 mg daily.  2. Nexium 40 mg daily.  3. Premarin 0.625 mg daily.  4. Enteric-coated aspirin daily.  5. Lipitor 10 mg q.h.s.  6. Metoprolol 25 mg b.i.d.   PAST MEDICAL HISTORY:  Positive for  coronary artery disease as described  above, hyperlipidemia, especially hypertriglyceridemia, arteriosclerotic  peripheral vascular disease, GERD, hypertension, history of restless leg  syndrome.   ALLERGIES:  No known drug allergies.   FAMILY HISTORY:  His mother had an MI in her 75's and died at age 26.  She  had a history of diabetes.  Father died at 65 in a motor vehicle accident.  She has nine siblings who have been healthy.   SOCIAL HISTORY:  She has three children.  She lives with her husband in  [place].  She has been married for several years.  She does not smoke  tobacco or drink alcohol.   PHYSICAL EXAMINATION:  VITAL SIGNS:  Blood pressure 142/68, heart rate  is 72  and regular, weight is 170.2.  GENERAL:  An elderly, pleasant, white female who looks younger than her  stated age in no acute distress.  HEENT:  Grossly normal.  NECK:  Supple.  Carotids are equal and strong bilaterally.  She does have a  soft right carotid bruit that has been noted before.  HEART:  Rhythm is regular, normal S1 and S2 without any murmurs, rubs, or  clicks.  LUNGS:  Clear to A&P with good bilateral chest expansion.  EXTREMITIES:  Without edema.  Pedal pulses are intact.   EKG and labs are pending.   IMPRESSION:  1. Coronary artery disease as noted with previous intervention with stenting     to an 80% right coronary artery in 2000, now with one episode of chest     heaviness.  She will undergo outpatient catheterization to determine if     she has any restenosis or progression of disease and also to clear her     for upcoming left total knee replacement.  2. Hyperlipidemia, mainly in the form of hypertriglyceridemia.  3. Arteriosclerotic peripheral vascular disease with right carotid bruit.  4. Gastroesophageal reflux disease.  5. Hypertension.  6. History of restless leg syndrome.   PLAN:  She will be set up for elective outpatient catheterization with Dr.  Vicenta Aly who had done her  previous study in 2000.  The patient agrees,  understands, and accepts the risks and benefits of the explained procedure.      Otelia Limes. Kathe Mariner.                    Minus Breeding, M.D.    MRD/MEDQ  D:  09/04/2003  T:  09/04/2003  Job:  [id number]   cc:   Hardie Shackleton, M.D.

## 2011-02-12 NOTE — Discharge Summary (Signed)
[place]  Patient:    Carrie Gillespie, Carrie Gillespie                         MRN: [id number] Adm. Date:  02/01/00 Disc. Date: 02/03/00 Attending:  Nikki Dom, M.D., [facility] Dictator:   Mannie Stabile, P.A. CC:         Lodema Hong, M.D.             [place]                  Referring Physician Discharge [place]  PROCEDURE:  Cardiac catheterization May 8.  Adenosine Cardiolite May 9.  REASON FOR ADMISSION:  Patient is a 77 year old female status post stent RCA June 2000 who initially presented to [facility] for evaluation of chest pain.  Patient was treated with IV Lopressor, morphine, and sublingual nitroglycerin.  No acute changes were noted by electrocardiography.  By electrocardiography and initial cardiac enzymes were negative for MI.  Plan was to transfer to [facility] for further diagnostic evaluation.  Patient was pain-free upon arrival.  LABORATORIES:  Hemoglobin 10.9, hematocrit 31.8 with normal WBC and platelets upon arrival.  Electrolytes and renal function within normal limits.  CPK 45/0.6, troponin I less than 0.03.  Cholesterol 168, triglycerides 338, HDL 46, LDL 54, cholesterol/HDL ratio 3.7.  TSH pending.  HOSPITAL COURSE:  Following transfer from [facility], a second set of cardiac enzymes were also negative for MI.  Cardiac catheterization performed on May 8 by Dr. Elta Guadeloupe Pulsipher (see catheterization report for full details) revealed non-obstructive CAD with 20-30% in-stent restenosis at prior stent RCA site.  Specifically, there was 40% proximal, 25% mid LAD, 20% osteal GX 1, 40% RI, 60% osteal RCA with some spasm improved with NTG, 30% proximal RCA.  LV gram:  Hyperdynamic LVF (EF greater than 60%).  Distal aortogram:  25% right RAS.  Of note, patient was treated with labetalol secondary to systolic hypertension of [id number].  Dr. Vicenta Aly recommended proceeding with postcatheterization adenosine Cardiolite and  to proceed with several PCI if ischemic was noted.  Subsequent perfusion images revealed no evidence of ischemia, EF of 72%.  Medication adjustments this admission:  Altace increased to 5 mg q.12h., Lopressor increased to 50 mg q.d.  DISCHARGE MEDICATIONS: 1. Altace 5 mg q.12h. 2. Toprol XL 50 mg q.d. 3. Coated aspirin 325 mg q.d. 4. Baycol 0.2 mg q.d. 5. Premarin 0.625 mg q.d. 6. Nitrostat as directed. 7. Prevacid 30 mg q.d.  INSTRUCTIONS:  Patient is to refrain from any strenuous activity, ______ x 2 days and to maintain low fat/low cholesterol diet.  She is to call our office if there is any swelling/bleeding of the groin.  Patient will follow up with Dr. Vicenta Aly on Wednesday, May 23 at 10:15 a.m. [facility]).  She will return to Dr. Rory Percy on a p.r.n. basis.  DISCHARGE DIAGNOSES: 1. Non-ischemic chest pain.    a. Non-obstructive coronary artery disease.  Cardiac catheterization May 8.    b. Normal adenosine Cardiolite, ejection fraction of 72% May 9.    c. Status post stent mid right coronary artery June 2000, 20-30% in-stent       restenosis. 2. Hypertension. 3. Dyslipidemia. DD:  02/03/00 TD:  02/03/00 Job: [id number]

## 2011-02-12 NOTE — Assessment & Plan Note (Signed)
[facility]                          [facility] NOTE   Carrie Gillespie, Carrie Gillespie                         MRN:          [id number]  DATE:12/15/2006                            DOB:          December 19, 1919    HISTORY OF PRESENT ILLNESS:  The patient is an 77 year old female with a  history of coronary artery disease. The patient has been doing quite  well. She reports no substernal chest pain, shortness of breath,  orthopnea or PND. She does report left flank pain during deep  inspiration. She also reports symptoms of nocturnal cramping secondary  to chronic venous insufficiency. She has no orthopnea, PND, palpitations  or syncope.   MEDICATIONS:  1. Aspirin 325 daily.  2. Prilosec 20 mg p.o. daily.  3. Metoprolol 25 mg p.o. b.i.d.  4. Isosorbide 30 mg a day.  5. Lovastatin 20 mg p.o. daily.  6. Lisinopril 5 mg p.o. daily.   PHYSICAL EXAMINATION:  Blood pressure 139/72, heart rate 98 beats per  minute, weight is 171 pounds.  GENERAL:  Well-developed white female in no apparent distress.  HEENT:  Pupils are equal, conjunctivae clear.  NECK:  Supple. Normal carotid upstroke. No carotid bruits.  LUNGS:  Clear breath sounds bilaterally.  HEART:  Regular rate and rhythm. Normal sinus. No murmurs, rubs, or  gallops.  ABDOMEN:  Soft.  EXTREMITIES:  No clubbing, cyanosis, or edema.   Twelve-lead EKG:  Normal sinus rhythm, no acute changes.   PROBLEM LIST:  1. Coronary artery disease.      a.     Status post stent to the right coronary artery in 2000.      b.     Repeat catheterization in 2004 with nonobstructive disease.      c.     Left ventricular function 65%.      d.     No recurrence of sternal chest pain.  2. Dyslipidemia.  3. Hypertension, stable.  4. Peripheral vascular disease with 0 to 39% stenosis bilaterally with      an occluded right vertebral artery.  5. Two subtle pulmonary nodules by CT scan September 26, 2007. Followup  requested.   PLAN:  1. The patient will be scheduled for a followup CT scan given findings      of pulmonary nodules. This will be done in the next couple of days.  2. The patient has been instructed to avoid prolonged standing for      venous insufficiency.  3. The patient is stable from a cardiovascular perspective. No further      stress testing is indicated at the present time.     Ernestine Mcmurray, MD,FACC  Electronically Signed    GED/MedQ  DD: 12/15/2006  DT: 12/15/2006  Job #: [id number]

## 2011-02-12 NOTE — Op Note (Signed)
NAMEJESUS, CIONI NO.:  [id number]   MEDICAL RECORD NO.:  [id number]                   PATIENT TYPE:  INP   LOCATION:  2899                                 FACILITY:  [facility]   PHYSICIAN:  Earleen Newport, M.D.               DATE OF BIRTH:  1920-04-10   DATE OF PROCEDURE:  04/13/2004  DATE OF DISCHARGE:                                 OPERATIVE REPORT   PREOPERATIVE DIAGNOSIS:  L4-L5 spondylolisthesis with lumbar radiculopathy  and low back pain.   POSTOPERATIVE DIAGNOSIS:  L4-L5 spondylolisthesis with lumbar radiculopathy  and low back pain.   OPERATION PERFORMED:  Bilateral laminotomies, diskectomy, posterior lumbar  interbody arthrodesis with PEAK bone spacer and pedicle screw fixation with  posterolateral arthrodesis using local autograft and allograft.   SURGEON:  Earleen Newport, M.D.   ASSISTANT:  Hosie Spangle, M.D.   ANESTHESIA:  General endotracheal.   INDICATIONS FOR PROCEDURE:  The patient is an 77 year old individual who is  having significant back and bilateral leg pain.  She has failed extensive  efforts at conservative management and after careful consideration, we  discussed surgical intervention to include decompression arthrodesis at L4  and L5.  She was taken to the operating room for this procedure.   DESCRIPTION OF PROCEDURE:  The patient was brought to the operating room  supine on the stretcher.  After smooth induction of general endotracheal  anesthesia, she was turned prone, the back was shaved and prepped with  DuraPrep and draped in sterile fashion.  Midline incision was created and  carried down to the lumbodorsal fascia which was opened on the inside of  midline to expose the spinous  processes of L4 and L5.  These were  identified positively with a radiograph.  Then the interlaminar space was  cleared.  The facet joints were noted to be severely hypertrophied.  The  yellow ligament was also redundant and  very thickened. This tissue was taken  down and laminotomy was created removing the inferior margin of the lamina  of L4 out to the facet.  The facet was noted to be severely degenerated and  markedly loose allowing for listhesis both anteriorly and posteriorly.  Facet joint was then taken down and this allowed for good exposure of the  common dural tube, the L5 nerve root inferiorly, the L4 nerve root  superiorly.  The soft tissues were cleared from this area and the disk space  was then identified.  It was noted to be bulged posteriorly adding to  compression of the L4 foramen superiorly.  This dissection was carried out  bilaterally.  The disk space was then entered and a complete diskectomy was  carried out using a combination of Kerrison rongeurs to remove the bulk of  the disk material within the disk space.  The end plates were then cleared  using the curved [employer] osteotome  in addition to a raspatory to  removed the deep cartilaginous end plates and decorticate the bone  adequately on either end plate.  The interspace was maintained open with a  self-retaining laminar spreader which allowed for good visualization and  decompression of the disk space itself.  In the end then, the interspace was  sized for a 10 mm PEAK bone spacer.  This was inserted without too much  difficulty and the bone was packed with a combination of allograft and  autograft which was harvested from the laminectomy defects.  The space was  filled completely and in addition, PEAK spacers were placed.  Pedicle entry  sites were then chosen at L5 and L4 and these were checked radiographically  for positioning.  Then 5.5 x 40 mm screws were placed in L5 and 5.5 x 50 mm  screws were placed in L4 after tapping and then sequentially checking each  hole for any evidence of cutout.  30 mm rods were placed between the screw  caps.  Prior to doing this the lateral gutters which were previously  decorticated and  packed off separately were packed with the patient's own  bone and a mixture of cortical and cancellous bone.  Once the decompression  was completed, the interlaminar space was covered with 2 strips of Helios  bone substitute which was soaked in the patient's bone marrow aspirate that  was harvested from each of the pedicle screw entry site holes.  With this  then, the wound was carefully inspected.  The system was tightened down in  its final position and a final radiograph was obtained.  The nerve roots  were then sounded  and L4 and L5 nerve roots were each found to be free and  clear.  No dural incursions had occurred during this procedure.  The  lumbodorsal fascia was reapproximated and closed with #1 Vicryl in  interrupted fashion, 2-0 Vicryl was used in subcutaneous and subcuticular  tissues and Dermabond was placed on the skin.  The patient tolerated the  procedure well.  Blood loss was estimated at about 150 mL.                                               Earleen Newport, M.D.    Drucilla Schmidt  D:  04/13/2004  T:  04/13/2004  Job:  [id number]

## 2011-02-12 NOTE — Cardiovascular Report (Signed)
NAME:  [name], [name] NO.:  [id number]   MEDICAL RECORD NO.:  [id number]                   PATIENT TYPE:  OIB   LOCATION:  6501                                 FACILITY:  [facility]   PHYSICIAN:  Junious Silk, M.D. [facility]         DATE OF BIRTH:  08-06-1920   DATE OF PROCEDURE:  09/06/2003  DATE OF DISCHARGE:                              CARDIAC CATHETERIZATION   PROCEDURE PERFORMED:  Left heart catheterization with coronary angiography  and left ventriculography.   INDICATION:  [name] is an 77 year old woman with history of previous  stent in the right coronary artery and moderate residual coronary artery  disease.  She had a prolonged episode of substernal chest pain six days ago.  She was evaluated in the office and referred for cardiac catheterization.   PROCEDURAL NOTE:  A 5 French sheath was placed in the right femoral artery.  Coronary angiography was performed with 5 [place] JL-4 and JR-4 catheters.  Intracoronary nitroglycerin was administered in the right coronary artery to  relieve potential vasospasm.  Left ventriculography was performed with an  angled pigtail catheter.  Contrast was Omnipaque.  There were no  complications.   RESULTS:   HEMODYNAMICS:  1. Left ventricular pressure 148/24.  2. Initial aortic pressure 212/90.  This improved to 150/64 after     intravenous Labetalol.   LEFT VENTRICULOGRAM:  The left ventricle is hyperdynamic.  Left ventricular  ejection fraction is estimated at greater than 65%.  There is 2+ mitral  regurgitation which may be secondary to catheter position.   CORONARY ARTERIOGRAPHY (RIGHT DOMINANT):  Left main is normal.   Left anterior descending artery has a 40% stenosis in the proximal vessel  and a 25% stenosis in the mid vessel.  The LAD gives rise to a normal size  first diagonal which has a 40% stenosis at its origin.   Left circumflex gives rise to a very large branching ramus intermedius and  a  small obtuse marginal.  The ramus intermedius has a 30% stenosis proximally.   Right coronary artery is a dominant vessel.  There is a 70-75% stenosis in  the ostium of the right coronary artery with mild pressure damping with  catheter ___________.  This did not improve with intracoronary  nitroglycerin.  There is a 30% stenosis in the proximal vessel.  There is a  stent in the mid right coronary artery with less than 20% stenosis within  the stent.  The distal right coronary artery gives rise to a normal size  posterior descending artery and a small posterior lateral branch.   IMPRESSION:  1. Normal left ventricular systolic function.  2. Patent stent in the right coronary artery.  There is disease in the     ostium of the right coronary artery which is borderline severity.  There     is mild, but nonobstructive disease in the left coronary arteries.  PLAN:  We will proceed with an outpatient adenosine Cardiolite to rule out  ischemia in the right coronary artery distribution.  If there is ischemia on  the scan, the right coronary artery could be treated percutaneously.  If  there is no ischemia on the scan, would recommend medical therapy.                                               Junious Silk, M.D. [facility]    MWP/MEDQ  D:  09/06/2003  T:  09/06/2003  Job:  [id number]   cc:   [facility]   [facility]   Dr. Jennings Books

## 2011-02-12 NOTE — Cardiovascular Report (Signed)
[place]  Patient:    Carrie Gillespie, Carrie Gillespie                         MRN: [id number] Proc. Date: 02/02/00 Adm. Date:  [id number] Disc. Date: [id number] Attending:  Allene Dillon CC:         Rory Percy, M.D.             Allene Dillon, M.D. [place], [place]             Cardiac Catheterization Laboratory                        Cardiac Catheterization  PROCEDURES PERFORMED:  Left heart catheterization with coronary angiography and left ventriculography.  INDICATIONS:  [name] is a 77 year old woman who underwent stent placement in her mid right coronary artery approximately one year ago.  She presented after an episode of prolonged chest pain and was referred for cardiac catheterization.  DESCRIPTION OF PROCEDURE:  A 6 French sheath was placed in the right femoral artery.  Standard Judkins 6 French catheters were utilized.  Contrast was Omnipaque.  The patient was significantly hypertensive at the beginning of the procedure.  She was given 20 mg of intravenous labetalol with improvement  in her blood pressure.  She also developed nausea after the first coronary injection, which was relieved with intravenous Zofran.  There were no complications.  RESULTS:  HEMODYNAMICS:  Left ventricular pressure was 144/23.  Aortic pressure initially was 194/86 decreasing to approximately 150/70 after labetalol. There was no aortic valve gradient.  LEFT VENTRICULOGRAM:  The left ventricle was hyperdynamic with normal wall motion.  Ejection fraction is greater than 65%.  There was mitral regurgitation, which appeared to be secondary to ventricular ectopy.  ABDOMINAL AORTOGRAM:  Abdominal aortogram revealed a 25% stenosis in the right renal artery, otherwise the aortogram was unremarkable.  CORONARY ARTERIOGRAPHY:  (Right dominant).  Left main:  Left main has less than 20% stenosis in the proximal to mid vessel.  Left anterior descending:   The LAD has a 40% stenosis in the proximal vessel and a diffuse 25% stenosis in the mid vessel.  There was a small diagonal arising from the proximal LAD which has a 25% stenosis.  Left circumflex:  The left circumflex coronary gives rise to a very large branching ramus intermedius and then terminates as a small obtuse marginal branch.  There is a 40% stenosis in the proximal portion of the ramus intermedius.  Right coronary artery:  The right coronary artery has a 60% stenosis at its ostium.  There appeared to be a component of a spasm associated with this lesion that did improve with nitroglycerin.  However, after nitroglycerin there still appeared to be residual stenosis of approximately 60%.  The proximal right coronary artery had a 30% stenosis and the mid right coronary artery had a 20-30% stenosis within the stent.  There is a large posterior descending artery and three small posterolateral branches.  IMPRESSION: 1. Normal left ventricular systolic function. 2. Coronary artery disease characterized by nonobstructive disease in the    left anterior descending artery and circumflex system.  There is a patent    stent in the mid right coronary artery.  There is stenosis in the ostium    of the right coronary artery which appears to be of borderline significance  with an associated component of spasm.  PLAN:  An adenosine Cardiolite will be obtained to rule out ischemia.  If this does show significant ischemia, then the patient could be treated percutaneously. DD:  02/02/00 TD:  02/04/00 Job: [id number]

## 2011-02-12 NOTE — Discharge Summary (Signed)
Carrie Gillespie, Carrie Gillespie NO.:  [id number]   MEDICAL RECORD NO.:  [id number]                   PATIENT TYPE:  INP   LOCATION:  3019                                 FACILITY:  [place]   PHYSICIAN:  Earleen Newport, M.D.               DATE OF BIRTH:  [birth year]   DATE OF ADMISSION:  04/13/2004  DATE OF DISCHARGE:  04/17/2004                                 DISCHARGE SUMMARY   ADMISSION DIAGNOSES:  Lumbar spondylolisthesis and stenosis L4-5 with lumbar  radiculopathy.   DISCHARGE/FINAL DIAGNOSES:  Lumbar spondylolisthesis and stenosis L4-5 with  lumbar radiculopathy.   CONDITION ON DISCHARGE:  Improving.   HOSPITAL COURSE:  [name] is an 77 year old individual who has had  significant back and bilateral legs pain.  She was found to have a  degenerative spondylolisthesis at the level of L4-5. She had considerable  problems with symptoms of neurogenic claudication and radiculopathy  secondary to a tight stenosis at the L4-5 level. She was advised regarding  surgical decompression. She does have some previous cardiac history with  some concerns about her advanced age for an operation of such magnitude.  However, on July 18, she was taken to the operating room where she underwent  decompression, arthrodesis from L4-L5.   Postoperatively, she was maintained in the intensive care unit for the first  48 hours, and she seemed to tolerate things well. She did have a Vasovagal  episode during her first postoperative day. This did not repeat itself and  she gradually was able to be ambulated and mobilized without too much  difficulty.  Her incision has remained clean and dry. She is now ambulating  150 feet or more independently. She is using the walker and able to don and  doff her corset with minimal assistance.   The patient was discharged to home with Percocet #40 without refills, Valium  5 mg #30 without refills. She will be seen in the office in 2 weeks'  time  for further follow-up.                                                Earleen Newport, M.D.    Drucilla Schmidt  D:  04/17/2004  T:  04/18/2004  Job:  [id number]

## 2011-03-09 ENCOUNTER — Encounter: Payer: Self-pay | Admitting: Cardiology

## 2011-03-14 ENCOUNTER — Other Ambulatory Visit: Payer: Self-pay | Admitting: Cardiology

## 2011-03-22 ENCOUNTER — Ambulatory Visit (INDEPENDENT_AMBULATORY_CARE_PROVIDER_SITE_OTHER): Payer: Medicare Other | Admitting: Cardiology

## 2011-03-22 ENCOUNTER — Encounter: Payer: Self-pay | Admitting: Cardiology

## 2011-03-22 VITALS — BP 162/73 | HR 76 | Ht 63.0 in | Wt 182.0 lb

## 2011-03-22 DIAGNOSIS — E785 Hyperlipidemia, unspecified: Secondary | ICD-10-CM

## 2011-03-22 DIAGNOSIS — I739 Peripheral vascular disease, unspecified: Secondary | ICD-10-CM | POA: Insufficient documentation

## 2011-03-22 DIAGNOSIS — I2510 Atherosclerotic heart disease of native coronary artery without angina pectoris: Secondary | ICD-10-CM

## 2011-03-22 DIAGNOSIS — I779 Disorder of arteries and arterioles, unspecified: Secondary | ICD-10-CM | POA: Insufficient documentation

## 2011-03-22 DIAGNOSIS — I10 Essential (primary) hypertension: Secondary | ICD-10-CM

## 2011-03-22 NOTE — Assessment & Plan Note (Signed)
Blood pressure elevated in the office today but the patient has a blood pressure cuff at home and readings are typically normal. She also told that her primary care physician's office the numbers were within normal range I made no changes in her medical regimen

## 2011-03-22 NOTE — Assessment & Plan Note (Signed)
Lipid panels followed by the patient's primary care physician. Last LDL cholesterol I have on record is 96 on 09/08/2010

## 2011-03-22 NOTE — Progress Notes (Signed)
HPI  The patient is an 77 year old female with history of coronary artery disease the details as outlined below. The patient a year ago had some atypical chest pain. Cardiolite study was ordered which was within normal limits. In the interim the patient has been doing well. She denies any chest pain shortness of breath orthopnea PND. She reports no palpitations or syncope. She is doing very well from a cardiovascular perspective  Not on File  Current Outpatient Prescriptions on File Prior to Visit  Medication Sig Dispense Refill  . aspirin EC 325 MG tablet Take 325 mg by mouth daily.        . carbamazepine (TEGRETOL XR) 200 MG 12 hr tablet Take 200 mg by mouth daily.        . chlorthalidone (HYGROTON) 25 MG tablet TAKE ONE TABLET BY MOUTH ONCE EVERY DAY  30 tablet  6  . isosorbide mononitrate (IMDUR) 60 MG 24 hr tablet TAKE ONE TABLET BY MOUTH EVERY DAY  30 tablet  6  . lisinopril (PRINIVIL,ZESTRIL) 10 MG tablet Take 10 mg by mouth daily.        Marland Kitchen lovastatin (MEVACOR) 20 MG tablet Take 20 mg by mouth at bedtime.        . metoprolol succinate (TOPROL-XL) 25 MG 24 hr tablet Take 25 mg by mouth 2 (two) times daily.       . Multiple Vitamins-Minerals (MULTIVITAL) tablet Take 1 tablet by mouth daily.        . nitroGLYCERIN (NITROSTAT) 0.4 MG SL tablet Place 0.4 mg under the tongue every 5 (five) minutes as needed.        Marland Kitchen omeprazole (PRILOSEC) 20 MG capsule Take 20 mg by mouth daily.        . vitamin B-12 (CYANOCOBALAMIN) 250 MCG tablet Take 250 mcg by mouth daily.        Marland Kitchen DISCONTD: ibuprofen (ADVIL,MOTRIN) 200 MG tablet Take 200 mg by mouth as needed.          Past Medical History  Diagnosis Date  . Peripheral vascular disease, unspecified     Minimal internal carotid artery disease  . Unspecified essential hypertension   . Other and unspecified hyperlipidemia   . Coronary atherosclerosis of native coronary artery     Status post stent to right coronary 2000, catheterization 2005  nonobstructive CAD, normal LV function, Cardiolite study 2008 negative for ischemia.  . Pulmonary nodule   . Carotid artery disease     with an occluded right vertebral artery    No past surgical history on file.  Family History  Problem Relation Age of Onset  . Hypertension Other   . Diabetes Other     History   Social History  . Marital Status: Married    Spouse Name: N/A    Number of Children: N/A  . Years of Education: N/A   Occupational History  . Not on file.   Social History Main Topics  . Smoking status: Never Smoker   . Smokeless tobacco: Never Used  . Alcohol Use: No  . Drug Use: No  . Sexually Active: Not on file   Other Topics Concern  . Not on file   Social History Narrative  . No narrative on file    [id number] positives as outlined above. The remainder of the 18  point review of systems is negative  PHYSICAL EXAM BP 162/73  Pulse 76  Ht 5\' 3"  (1.6 m)  Wt 182 lb (82.555 kg)  BMI 32.24 kg/m2  General: Well-developed, well-nourished in no distress Head: Normocephalic and atraumatic Eyes:PERRLA/EOMI intact, conjunctiva and lids normal Ears: No deformity or lesions Mouth:normal dentition, normal posterior pharynx Neck: Supple, no JVD.  No masses, thyromegaly or abnormal cervical nodes Lungs: Normal breath sounds bilaterally without wheezing.  Normal percussion Cardiac: regular rate and rhythm with normal S1 and S2, no S3 or S4.  PMI is normal.  No pathological murmurs Abdomen: Normal bowel sounds, abdomen is soft and nontender without masses, organomegaly or hernias noted.  No hepatosplenomegaly MSK: Back normal, normal gait muscle strength and tone normal Vascular: Pulse is normal in all 4 extremities Extremities: No peripheral pitting edema Neurologic: Alert and oriented x 3 Skin: Intact without lesions or rashes Lymphatics: No significant adenopathy Psychologic: Normal affect   ECG: Not available  ASSESSMENT AND PLAN

## 2011-03-22 NOTE — Assessment & Plan Note (Signed)
Patient reports no chest pain. Continue current medical therapy.

## 2011-03-22 NOTE — Patient Instructions (Signed)
Your physician wants you to follow-up in: 6 months. You will receive a reminder letter in the mail one-two months in advance. If you don't receive a letter, please call our office to schedule the follow-up appointment. Your physician recommends that you continue on your current medications as directed. Please refer to the Current Medication list given to you today. 

## 2011-05-23 DIAGNOSIS — I200 Unstable angina: Secondary | ICD-10-CM

## 2011-05-23 DIAGNOSIS — I119 Hypertensive heart disease without heart failure: Secondary | ICD-10-CM

## 2011-05-23 DIAGNOSIS — I2510 Atherosclerotic heart disease of native coronary artery without angina pectoris: Secondary | ICD-10-CM

## 2011-05-24 DIAGNOSIS — I200 Unstable angina: Secondary | ICD-10-CM

## 2011-05-25 DIAGNOSIS — I2510 Atherosclerotic heart disease of native coronary artery without angina pectoris: Secondary | ICD-10-CM

## 2011-05-30 DIAGNOSIS — R079 Chest pain, unspecified: Secondary | ICD-10-CM

## 2011-06-18 ENCOUNTER — Encounter: Payer: Self-pay | Admitting: Cardiology

## 2011-06-18 ENCOUNTER — Ambulatory Visit (INDEPENDENT_AMBULATORY_CARE_PROVIDER_SITE_OTHER): Payer: Medicare Other | Admitting: Cardiology

## 2011-06-18 VITALS — BP 137/72 | HR 64 | Ht 63.0 in | Wt 177.0 lb

## 2011-06-18 DIAGNOSIS — G4700 Insomnia, unspecified: Secondary | ICD-10-CM

## 2011-06-18 DIAGNOSIS — I2510 Atherosclerotic heart disease of native coronary artery without angina pectoris: Secondary | ICD-10-CM

## 2011-06-18 DIAGNOSIS — I779 Disorder of arteries and arterioles, unspecified: Secondary | ICD-10-CM

## 2011-06-18 DIAGNOSIS — I10 Essential (primary) hypertension: Secondary | ICD-10-CM

## 2011-06-18 DIAGNOSIS — E871 Hypo-osmolality and hyponatremia: Secondary | ICD-10-CM

## 2011-06-18 MED ORDER — TRAZODONE HCL 50 MG PO TABS: 25.0000 mg | ORAL_TABLET | Freq: Every day | ORAL | Status: AC

## 2011-06-18 MED ORDER — ISOSORBIDE MONONITRATE ER 60 MG PO TB24: ORAL_TABLET | ORAL | Status: DC

## 2011-06-18 NOTE — Patient Instructions (Signed)
Your physician wants you to follow-up in: 6 months. You will receive a reminder letter in the mail one-two months in advance. If you don't receive a letter, please call our office to schedule the follow-up appointment. Increase Imdur (isosorbide) to 90 mg daily. Take 1 1/2 of your 60 mg tablets daily. Take Trazodone 25 mg (1/2 of a 50 mg tablet) 1 hour before bedtime for sleep.

## 2011-06-21 DIAGNOSIS — E871 Hypo-osmolality and hyponatremia: Secondary | ICD-10-CM | POA: Insufficient documentation

## 2011-06-21 DIAGNOSIS — G4700 Insomnia, unspecified: Secondary | ICD-10-CM | POA: Insufficient documentation

## 2011-06-21 NOTE — Assessment & Plan Note (Signed)
Patient was given a prescription of trazodone 25 mg by mouth at bedtime

## 2011-06-21 NOTE — Assessment & Plan Note (Signed)
Coronary artery disease: Stable chest pain. Increase Imdur to 90 mg daily. Given the recent negative stress test I do not think is an indication for cardiac catheterization.

## 2011-06-21 NOTE — Assessment & Plan Note (Signed)
Blood pressure well controlled

## 2011-06-21 NOTE — Progress Notes (Signed)
HPI The patient is an 77 year old female with history of coronary artery disease status post stent placement to the right coronary artery 2004 with residual moderate ostial RCA disease. She has been hospitalized in the last month on 2 separate occasions for substernal chest pain. She has ruled out for myocardial infarction. She underwent stress testing which showed no ischemia and an ejection fraction of 78%. Adjustments were made in her medical regimen. She actually has done quite well. She denies any shortness of breath orthopnea PND. She had a single episode of substernal chest pain which resolved with 2 sublingual nitroglycerin. The patient state that she has difficulty sleeping. Sometimes she gets down and upset because her husband has severe dementia and she has a hard time dealing with him.  No Known Allergies  Current Outpatient Prescriptions on File Prior to Visit  Medication Sig Dispense Refill  . acetaminophen (TYLENOL) 325 MG tablet Take 650 mg by mouth every 6 (six) hours as needed.        . carbamazepine (TEGRETOL XR) 200 MG 12 hr tablet Take 200 mg by mouth daily.        . chlorthalidone (HYGROTON) 25 MG tablet TAKE ONE TABLET BY MOUTH ONCE EVERY DAY  30 tablet  6  . lisinopril (PRINIVIL,ZESTRIL) 10 MG tablet Take 10 mg by mouth daily.        Marland Kitchen lovastatin (MEVACOR) 20 MG tablet Take 20 mg by mouth at bedtime.        . Multiple Vitamins-Minerals (MULTIVITAL) tablet Take 1 tablet by mouth daily.        . nitroGLYCERIN (NITROSTAT) 0.4 MG SL tablet Place 0.4 mg under the tongue every 5 (five) minutes as needed.        Marland Kitchen omeprazole (PRILOSEC) 20 MG capsule Take 20 mg by mouth daily.        . vitamin B-12 (CYANOCOBALAMIN) 250 MCG tablet Take 250 mcg by mouth daily.          Past Medical History  Diagnosis Date  . Peripheral vascular disease, unspecified     Minimal internal carotid artery disease  . Unspecified essential hypertension   . Other and unspecified hyperlipidemia   .  Coronary atherosclerosis of native coronary artery     Status post stent to right coronary 2000, catheterization 2005 nonobstructive CAD, normal LV function, Cardiolite study 2008 negative for ischemia.  . Pulmonary nodule   . Carotid artery disease     with an occluded right vertebral artery    Past Surgical History  Procedure Date  . Kidney surgery 2010    1/4 of right kidney removed [facility]  . Cardiac catheterization     nonobstructive coronary artery disease    Family History  Problem Relation Age of Onset  . Hypertension Other   . Diabetes Other     History   Social History  . Marital Status: Married    Spouse Name: N/A    Number of Children: N/A  . Years of Education: N/A   Occupational History  . Not on file.   Social History Main Topics  . Smoking status: Never Smoker   . Smokeless tobacco: Never Used  . Alcohol Use: No  . Drug Use: No  . Sexually Active: Not on file   Other Topics Concern  . Not on file   Social History Narrative  . No narrative on file   [id number] positives as outlined above. The remainder of the 18  point review of systems is negative  PHYSICAL EXAM BP 137/72  Pulse 64  Ht 5\' 3"  (1.6 m)  Wt 177 lb (80.287 kg)  BMI 31.35 kg/m2  General: Well-developed, well-nourished in no distress Head: Normocephalic and atraumatic Eyes:PERRLA/EOMI intact, conjunctiva and lids normal Ears: No deformity or lesions Mouth:normal dentition, normal posterior pharynx Neck: Supple, no JVD.  No masses, thyromegaly or abnormal cervical nodes Lungs: Normal breath sounds bilaterally without wheezing.  Normal percussion Cardiac: regular rate and rhythm with normal S1 and S2, no S3 or S4.  PMI is normal.  No pathological murmurs Abdomen: Normal bowel sounds, abdomen is soft and nontender without masses, organomegaly or hernias noted.  No hepatosplenomegaly MSK: Back normal, normal gait muscle strength and tone normal Vascular: Pulse is normal in all 4  extremities Extremities: No peripheral pitting edema Neurologic: Alert and oriented x 3 Skin: Intact without lesions or rashes Lymphatics: No significant adenopathy Psychologic: Normal affect   ECG: Not available  ASSESSMENT AND PLAN

## 2011-06-21 NOTE — Assessment & Plan Note (Addendum)
Followed by Dr. Pleas Koch. The patient also status post nephrectomy

## 2011-09-10 ENCOUNTER — Ambulatory Visit (INDEPENDENT_AMBULATORY_CARE_PROVIDER_SITE_OTHER): Payer: Medicare Other | Admitting: Ophthalmology

## 2011-09-10 DIAGNOSIS — D3130 Benign neoplasm of unspecified choroid: Secondary | ICD-10-CM

## 2011-09-10 DIAGNOSIS — H3530 Unspecified macular degeneration: Secondary | ICD-10-CM

## 2011-09-10 DIAGNOSIS — H43819 Vitreous degeneration, unspecified eye: Secondary | ICD-10-CM

## 2011-10-01 DIAGNOSIS — E871 Hypo-osmolality and hyponatremia: Secondary | ICD-10-CM | POA: Diagnosis not present

## 2011-10-01 DIAGNOSIS — I10 Essential (primary) hypertension: Secondary | ICD-10-CM | POA: Diagnosis not present

## 2011-10-01 DIAGNOSIS — E782 Mixed hyperlipidemia: Secondary | ICD-10-CM | POA: Diagnosis not present

## 2011-10-05 DIAGNOSIS — E782 Mixed hyperlipidemia: Secondary | ICD-10-CM | POA: Diagnosis not present

## 2011-10-05 DIAGNOSIS — E871 Hypo-osmolality and hyponatremia: Secondary | ICD-10-CM | POA: Diagnosis not present

## 2011-10-05 DIAGNOSIS — I10 Essential (primary) hypertension: Secondary | ICD-10-CM | POA: Diagnosis not present

## 2011-10-05 DIAGNOSIS — M1990 Unspecified osteoarthritis, unspecified site: Secondary | ICD-10-CM | POA: Diagnosis not present

## 2011-10-05 DIAGNOSIS — K219 Gastro-esophageal reflux disease without esophagitis: Secondary | ICD-10-CM | POA: Diagnosis not present

## 2011-10-05 DIAGNOSIS — I200 Unstable angina: Secondary | ICD-10-CM | POA: Diagnosis not present

## 2011-10-05 DIAGNOSIS — G2581 Restless legs syndrome: Secondary | ICD-10-CM | POA: Diagnosis not present

## 2011-10-05 DIAGNOSIS — G40909 Epilepsy, unspecified, not intractable, without status epilepticus: Secondary | ICD-10-CM | POA: Diagnosis not present

## 2011-11-06 ENCOUNTER — Other Ambulatory Visit: Payer: Self-pay | Admitting: Cardiology

## 2011-11-17 DIAGNOSIS — M25579 Pain in unspecified ankle and joints of unspecified foot: Secondary | ICD-10-CM | POA: Diagnosis not present

## 2011-11-18 DIAGNOSIS — M79609 Pain in unspecified limb: Secondary | ICD-10-CM | POA: Diagnosis not present

## 2011-11-18 DIAGNOSIS — R0989 Other specified symptoms and signs involving the circulatory and respiratory systems: Secondary | ICD-10-CM | POA: Diagnosis not present

## 2011-11-18 DIAGNOSIS — I10 Essential (primary) hypertension: Secondary | ICD-10-CM | POA: Diagnosis not present

## 2011-11-24 DIAGNOSIS — G608 Other hereditary and idiopathic neuropathies: Secondary | ICD-10-CM | POA: Diagnosis not present

## 2011-11-24 DIAGNOSIS — M545 Low back pain, unspecified: Secondary | ICD-10-CM | POA: Diagnosis not present

## 2011-11-24 DIAGNOSIS — M25569 Pain in unspecified knee: Secondary | ICD-10-CM | POA: Diagnosis not present

## 2011-11-24 DIAGNOSIS — M25559 Pain in unspecified hip: Secondary | ICD-10-CM | POA: Diagnosis not present

## 2011-11-24 DIAGNOSIS — M25579 Pain in unspecified ankle and joints of unspecified foot: Secondary | ICD-10-CM | POA: Diagnosis not present

## 2011-11-24 DIAGNOSIS — I10 Essential (primary) hypertension: Secondary | ICD-10-CM | POA: Diagnosis not present

## 2011-11-29 DIAGNOSIS — Z981 Arthrodesis status: Secondary | ICD-10-CM | POA: Diagnosis not present

## 2011-11-29 DIAGNOSIS — M48061 Spinal stenosis, lumbar region without neurogenic claudication: Secondary | ICD-10-CM | POA: Diagnosis not present

## 2011-11-29 DIAGNOSIS — M5126 Other intervertebral disc displacement, lumbar region: Secondary | ICD-10-CM | POA: Diagnosis not present

## 2011-12-01 DIAGNOSIS — M25559 Pain in unspecified hip: Secondary | ICD-10-CM | POA: Diagnosis not present

## 2011-12-07 DIAGNOSIS — C649 Malignant neoplasm of unspecified kidney, except renal pelvis: Secondary | ICD-10-CM | POA: Diagnosis not present

## 2011-12-07 DIAGNOSIS — Z85528 Personal history of other malignant neoplasm of kidney: Secondary | ICD-10-CM | POA: Diagnosis not present

## 2011-12-07 DIAGNOSIS — N281 Cyst of kidney, acquired: Secondary | ICD-10-CM | POA: Diagnosis not present

## 2011-12-07 DIAGNOSIS — Q619 Cystic kidney disease, unspecified: Secondary | ICD-10-CM | POA: Diagnosis not present

## 2011-12-07 DIAGNOSIS — R350 Frequency of micturition: Secondary | ICD-10-CM | POA: Diagnosis not present

## 2011-12-07 DIAGNOSIS — N289 Disorder of kidney and ureter, unspecified: Secondary | ICD-10-CM | POA: Diagnosis not present

## 2011-12-07 DIAGNOSIS — N3289 Other specified disorders of bladder: Secondary | ICD-10-CM | POA: Diagnosis not present

## 2011-12-16 ENCOUNTER — Encounter: Payer: Self-pay | Admitting: Cardiology

## 2011-12-16 ENCOUNTER — Ambulatory Visit (INDEPENDENT_AMBULATORY_CARE_PROVIDER_SITE_OTHER): Payer: Medicare Other | Admitting: Cardiology

## 2011-12-16 VITALS — BP 141/69 | HR 66 | Ht 63.0 in | Wt 186.0 lb

## 2011-12-16 DIAGNOSIS — I2510 Atherosclerotic heart disease of native coronary artery without angina pectoris: Secondary | ICD-10-CM

## 2011-12-16 DIAGNOSIS — G5790 Unspecified mononeuropathy of unspecified lower limb: Secondary | ICD-10-CM | POA: Insufficient documentation

## 2011-12-16 DIAGNOSIS — M792 Neuralgia and neuritis, unspecified: Secondary | ICD-10-CM | POA: Insufficient documentation

## 2011-12-16 DIAGNOSIS — H6090 Unspecified otitis externa, unspecified ear: Secondary | ICD-10-CM | POA: Insufficient documentation

## 2011-12-16 MED ORDER — NEOMYCIN-POLYMYXIN-HC 1 % OT SOLN: 4.0000 [drp] | Freq: Three times a day (TID) | OTIC | Status: DC

## 2011-12-16 MED ORDER — ISOSORBIDE MONONITRATE ER 120 MG PO TB24: 120.0000 mg | ORAL_TABLET | Freq: Every day | ORAL | Status: DC

## 2011-12-16 MED ORDER — GABAPENTIN 300 MG PO CAPS: 300.0000 mg | ORAL_CAPSULE | Freq: Every day | ORAL | Status: DC

## 2011-12-16 NOTE — Progress Notes (Signed)
Carrie Real, MD, [facility] Board Certified in Adult Cardiovascular Medicine,Internal Medicine and Critical Care Medicine    CC: Followup patient history of coronary artery disease and chest pain  HPI:  The patient is a 78 year old female with history of coronary artery disease and occasional episodes of angina. Since her last visit she had 2 episodes of chest pain and required on 2 separate occasions nitroglycerin which quickly resolved her symptoms. Her main problems centered around pain and cramps in the left lower extremity which prevents her from sleeping. She states that last night he only slept one hour. Last office visit I gave her some trazodone which helped to some extent but still did not provide her adequate sleep. She has been given pain medications by her primary care physician which allows her to get a couple of hours of sleep. She appears to have severe neuropathic pain in the left lower extremity radiating from the back she did have an MRI for evaluation. Clinically she appears to have I nerve entrapment syndrome with loss of ankle and knee reflexes in the left side. She also has markedly decreased strength in the left leg. From a cardiac standpoint otherwise the patient reports no palpitations orthopnea PND She did report today at the last few days she had severe pain in the right ear without loss of hearing. She also reports no drainage or bleeding in the right ear. I told the patient that I would examine her external ear canal with an otoscope.      PMH: reviewed and listed in Problem List in Electronic Records (and see below) Past Medical History  Diagnosis Date  . Peripheral vascular disease, unspecified     Minimal internal carotid artery disease  . Unspecified essential hypertension   . Other and unspecified hyperlipidemia   . Coronary atherosclerosis of native coronary artery     Status post stent to right coronary 2004, catheterization 2005 nonobstructive CAD,  normal LV function, Cardiolite study 2008 negative for ischemia. Residual moderate ostial RCA disease. Stress testing 2012 negative for ischemia ejection fraction 78%  . Pulmonary nodule   . Carotid artery disease     with an occluded right vertebral artery, otherwise nonobstructive carotid artery disease  . Nerve entrapment syndrome of lower extremity      status post prior back surgery and weakness in left leg   Past Surgical History  Procedure Date  . Kidney surgery 2010    1/4 of right kidney removed [facility]  . Cardiac catheterization     nonobstructive coronary artery disease    Allergies/SH/FHX : available in Electronic Records for review  No Known Allergies History   Social History  . Marital Status: Married    Spouse Name: N/A    Number of Children: N/A  . Years of Education: N/A   Occupational History  . Not on file.   Social History Main Topics  . Smoking status: Never Smoker   . Smokeless tobacco: Never Used  . Alcohol Use: No  . Drug Use: No  . Sexually Active: Not on file   Other Topics Concern  . Not on file   Social History Narrative  . No narrative on file   Family History  Problem Relation Age of Onset  . Hypertension Other   . Diabetes Other     Medications: Current Outpatient Prescriptions  Medication Sig Dispense Refill  . acetaminophen (TYLENOL) 500 MG tablet Take 500 mg by mouth every 6 (six) hours as needed.      Marland Kitchen  amLODipine (NORVASC) 10 MG tablet Take 10 mg by mouth daily.        Marland Kitchen aspirin EC 81 MG tablet Take 81 mg by mouth daily.        . carbamazepine (TEGRETOL XR) 200 MG 12 hr tablet Take 200 mg by mouth daily.        . chlorthalidone (HYGROTON) 25 MG tablet TAKE ONE TABLET BY MOUTH ONCE EVERY DAY  30 tablet  6  . doxylamine, Sleep, (EQ SLEEP AID) 25 MG tablet Take 25 mg by mouth at bedtime as needed.      Marland Kitchen HYDROcodone-acetaminophen (LORTAB) 7.5-500 MG per tablet Take 1 tablet by mouth every 6 (six) hours as needed.      Marland Kitchen  lisinopril (PRINIVIL,ZESTRIL) 10 MG tablet TAKE ONE TABLET BY MOUTH EVERY DAY  90 tablet  3  . loratadine (EQ ALLERGY RELIEF) 10 MG tablet Take 10 mg by mouth daily.      Marland Kitchen lovastatin (MEVACOR) 20 MG tablet TAKE ONE TABLET BY MOUTH AT BEDTIME  90 tablet  0  . metoprolol (LOPRESSOR) 50 MG tablet Take 50 mg by mouth daily.      . Multiple Vitamins-Minerals (MULTIVITAL) tablet Take 1 tablet by mouth daily.        . nitroGLYCERIN (NITROSTAT) 0.4 MG SL tablet Place 0.4 mg under the tongue every 5 (five) minutes as needed.        Marland Kitchen omeprazole (PRILOSEC) 20 MG capsule Take 20 mg by mouth daily.        . vitamin B-12 (CYANOCOBALAMIN) 250 MCG tablet Take 250 mcg by mouth daily.          ROS: No nausea or vomiting. No fever or chills.No melena or hematochezia.No bleeding.No claudication. Left leg pain with markedly decreased strength in the left leg.  Physical Exam: BP 141/69  Pulse 66  Ht 5\' 3"  (1.6 m)  Wt 186 lb (84.369 kg)  BMI 32.95 kg/m2 General: Well-nourished white female in no distress Neck: Normal carotid upstroke no carotid bruits. No thyromegaly nonnodular thyroid. Lungs: Clear breath sounds bilaterally no wheezing Cardiac: Regular rate and rhythm with normal S1-S2 no murmur rubs or gallops Vascular: No edema. Normal distal pulses Neurologic: Absent left ankle and left knee reflex. Markedly is decreased strength in quadriceps of the left leg as well as bending and extension of the left lower extremity. Skin: Warm and dry Physcologic: Normal affect  12lead ECG: Normal sinus rhythm no acute changes  Limited bedside ECHO:N/A No images are attached to the encounter.    Patient Active Problem List  Diagnoses  . HYPERLIPIDEMIA-MIXED  . HYPERTENSION, UNSPECIFIED  . Peripheral vascular disease, unspecified  . Coronary atherosclerosis of native coronary artery  . Carotid artery disease  . Hyponatremia  . Insomnia  . Nerve entrapment syndrome of lower extremity  . Otitis externa  .  Neuropathic pain of lower extremity    PLAN   From a cardiac standpoint the patient is stable. She has chronic stable angina. I increased her Imdur to 120 mg by mouth daily.  The patient has a otitis externa. I asked carefully cleaned and irrigated her right ear canal and then apply Cortisporin OTC 4 drops in the right ear 3-4 times a day. She can do this for 10 days. If she has any further problems in this time. She should consult and ENT physician.  The patient has a followup appointment scheduled with the neurosurgeon given her pain in the left lower extremity which prevents her  from sleeping. She clearly has neuropathic pain likely sciatica with loss of strength and loss of reflexes in the lower extremity. I gave her prescription of gabapentin in the hope to provide some relief in her neuropathic pain and hopefully this will allow her to sleep also at 300 mg by mouth each bedtime. She will need however further followup with her neurosurgeon.

## 2011-12-16 NOTE — Patient Instructions (Signed)
   Gabapentin 300mg  every evening  Cortisporin otic solution to right ear three x daily for 10 days  Increase Imdur to 120mg  daily ( may take 2 tabs of your 60mg  till finish current bottle) Your physician wants you to follow up in: 6 months.  You will receive a reminder letter in the mail one-two months in advance.  If you don't receive a letter, please call our office to schedule the follow up appointment

## 2011-12-20 ENCOUNTER — Other Ambulatory Visit: Payer: Self-pay | Admitting: *Deleted

## 2011-12-20 MED ORDER — CHLORTHALIDONE 25 MG PO TABS: 25.0000 mg | ORAL_TABLET | Freq: Every day | ORAL | Status: DC

## 2012-01-12 DIAGNOSIS — M25559 Pain in unspecified hip: Secondary | ICD-10-CM | POA: Diagnosis not present

## 2012-01-17 DIAGNOSIS — M25559 Pain in unspecified hip: Secondary | ICD-10-CM | POA: Diagnosis not present

## 2012-01-18 DIAGNOSIS — H1044 Vernal conjunctivitis: Secondary | ICD-10-CM | POA: Diagnosis not present

## 2012-02-07 ENCOUNTER — Other Ambulatory Visit: Payer: Self-pay | Admitting: Cardiology

## 2012-02-09 DIAGNOSIS — M25559 Pain in unspecified hip: Secondary | ICD-10-CM | POA: Diagnosis not present

## 2012-02-23 DIAGNOSIS — M25579 Pain in unspecified ankle and joints of unspecified foot: Secondary | ICD-10-CM | POA: Diagnosis not present

## 2012-02-23 DIAGNOSIS — M79609 Pain in unspecified limb: Secondary | ICD-10-CM | POA: Diagnosis not present

## 2012-02-28 DIAGNOSIS — E871 Hypo-osmolality and hyponatremia: Secondary | ICD-10-CM | POA: Diagnosis not present

## 2012-02-28 DIAGNOSIS — E782 Mixed hyperlipidemia: Secondary | ICD-10-CM | POA: Diagnosis not present

## 2012-02-28 DIAGNOSIS — K2100 Gastro-esophageal reflux disease with esophagitis, without bleeding: Secondary | ICD-10-CM | POA: Diagnosis not present

## 2012-02-28 DIAGNOSIS — I10 Essential (primary) hypertension: Secondary | ICD-10-CM | POA: Diagnosis not present

## 2012-02-28 DIAGNOSIS — E559 Vitamin D deficiency, unspecified: Secondary | ICD-10-CM | POA: Diagnosis not present

## 2012-03-07 DIAGNOSIS — G40909 Epilepsy, unspecified, not intractable, without status epilepticus: Secondary | ICD-10-CM | POA: Diagnosis not present

## 2012-03-07 DIAGNOSIS — I200 Unstable angina: Secondary | ICD-10-CM | POA: Diagnosis not present

## 2012-03-07 DIAGNOSIS — G2581 Restless legs syndrome: Secondary | ICD-10-CM | POA: Diagnosis not present

## 2012-03-07 DIAGNOSIS — I10 Essential (primary) hypertension: Secondary | ICD-10-CM | POA: Diagnosis not present

## 2012-03-07 DIAGNOSIS — E782 Mixed hyperlipidemia: Secondary | ICD-10-CM | POA: Diagnosis not present

## 2012-03-07 DIAGNOSIS — E871 Hypo-osmolality and hyponatremia: Secondary | ICD-10-CM | POA: Diagnosis not present

## 2012-03-07 DIAGNOSIS — M1990 Unspecified osteoarthritis, unspecified site: Secondary | ICD-10-CM | POA: Diagnosis not present

## 2012-03-07 DIAGNOSIS — K219 Gastro-esophageal reflux disease without esophagitis: Secondary | ICD-10-CM | POA: Diagnosis not present

## 2012-03-08 DIAGNOSIS — M79609 Pain in unspecified limb: Secondary | ICD-10-CM | POA: Diagnosis not present

## 2012-03-08 DIAGNOSIS — G608 Other hereditary and idiopathic neuropathies: Secondary | ICD-10-CM | POA: Diagnosis not present

## 2012-03-08 DIAGNOSIS — G5790 Unspecified mononeuropathy of unspecified lower limb: Secondary | ICD-10-CM | POA: Diagnosis not present

## 2012-03-13 ENCOUNTER — Ambulatory Visit (INDEPENDENT_AMBULATORY_CARE_PROVIDER_SITE_OTHER): Payer: Medicare Other | Admitting: Ophthalmology

## 2012-03-13 DIAGNOSIS — H2700 Aphakia, unspecified eye: Secondary | ICD-10-CM

## 2012-03-13 DIAGNOSIS — I10 Essential (primary) hypertension: Secondary | ICD-10-CM

## 2012-03-13 DIAGNOSIS — H43819 Vitreous degeneration, unspecified eye: Secondary | ICD-10-CM

## 2012-03-13 DIAGNOSIS — H3530 Unspecified macular degeneration: Secondary | ICD-10-CM

## 2012-03-13 DIAGNOSIS — D3130 Benign neoplasm of unspecified choroid: Secondary | ICD-10-CM

## 2012-03-13 DIAGNOSIS — H35039 Hypertensive retinopathy, unspecified eye: Secondary | ICD-10-CM

## 2012-03-15 DIAGNOSIS — M4716 Other spondylosis with myelopathy, lumbar region: Secondary | ICD-10-CM | POA: Diagnosis not present

## 2012-03-15 DIAGNOSIS — M48061 Spinal stenosis, lumbar region without neurogenic claudication: Secondary | ICD-10-CM | POA: Diagnosis not present

## 2012-03-22 DIAGNOSIS — M79609 Pain in unspecified limb: Secondary | ICD-10-CM | POA: Diagnosis not present

## 2012-03-22 DIAGNOSIS — G5790 Unspecified mononeuropathy of unspecified lower limb: Secondary | ICD-10-CM | POA: Diagnosis not present

## 2012-03-22 DIAGNOSIS — G608 Other hereditary and idiopathic neuropathies: Secondary | ICD-10-CM | POA: Diagnosis not present

## 2012-03-23 DIAGNOSIS — Z1231 Encounter for screening mammogram for malignant neoplasm of breast: Secondary | ICD-10-CM | POA: Diagnosis not present

## 2012-04-05 DIAGNOSIS — G608 Other hereditary and idiopathic neuropathies: Secondary | ICD-10-CM | POA: Diagnosis not present

## 2012-04-05 DIAGNOSIS — M79609 Pain in unspecified limb: Secondary | ICD-10-CM | POA: Diagnosis not present

## 2012-04-05 DIAGNOSIS — G5790 Unspecified mononeuropathy of unspecified lower limb: Secondary | ICD-10-CM | POA: Diagnosis not present

## 2012-04-10 DIAGNOSIS — M47817 Spondylosis without myelopathy or radiculopathy, lumbosacral region: Secondary | ICD-10-CM | POA: Diagnosis not present

## 2012-04-10 DIAGNOSIS — M48061 Spinal stenosis, lumbar region without neurogenic claudication: Secondary | ICD-10-CM | POA: Diagnosis not present

## 2012-04-27 DIAGNOSIS — H353190 Nonexudative age-related macular degeneration, unspecified eye, stage unspecified: Secondary | ICD-10-CM | POA: Diagnosis not present

## 2012-05-06 ENCOUNTER — Other Ambulatory Visit: Payer: Self-pay | Admitting: Cardiology

## 2012-05-10 DIAGNOSIS — G608 Other hereditary and idiopathic neuropathies: Secondary | ICD-10-CM | POA: Diagnosis not present

## 2012-05-10 DIAGNOSIS — M79609 Pain in unspecified limb: Secondary | ICD-10-CM | POA: Diagnosis not present

## 2012-05-10 DIAGNOSIS — G5790 Unspecified mononeuropathy of unspecified lower limb: Secondary | ICD-10-CM | POA: Diagnosis not present

## 2012-06-01 DIAGNOSIS — K219 Gastro-esophageal reflux disease without esophagitis: Secondary | ICD-10-CM | POA: Diagnosis not present

## 2012-06-01 DIAGNOSIS — I200 Unstable angina: Secondary | ICD-10-CM | POA: Diagnosis not present

## 2012-06-01 DIAGNOSIS — E782 Mixed hyperlipidemia: Secondary | ICD-10-CM | POA: Diagnosis not present

## 2012-06-01 DIAGNOSIS — G40909 Epilepsy, unspecified, not intractable, without status epilepticus: Secondary | ICD-10-CM | POA: Diagnosis not present

## 2012-06-01 DIAGNOSIS — M1990 Unspecified osteoarthritis, unspecified site: Secondary | ICD-10-CM | POA: Diagnosis not present

## 2012-06-01 DIAGNOSIS — E871 Hypo-osmolality and hyponatremia: Secondary | ICD-10-CM | POA: Diagnosis not present

## 2012-06-01 DIAGNOSIS — I10 Essential (primary) hypertension: Secondary | ICD-10-CM | POA: Diagnosis not present

## 2012-06-07 DIAGNOSIS — M79609 Pain in unspecified limb: Secondary | ICD-10-CM | POA: Diagnosis not present

## 2012-06-07 DIAGNOSIS — G608 Other hereditary and idiopathic neuropathies: Secondary | ICD-10-CM | POA: Diagnosis not present

## 2012-06-07 DIAGNOSIS — G5790 Unspecified mononeuropathy of unspecified lower limb: Secondary | ICD-10-CM | POA: Diagnosis not present

## 2012-06-20 ENCOUNTER — Other Ambulatory Visit: Payer: Self-pay | Admitting: Cardiology

## 2012-06-20 ENCOUNTER — Ambulatory Visit: Payer: Medicare Other | Admitting: Cardiology

## 2012-07-04 ENCOUNTER — Encounter: Payer: Self-pay | Admitting: Cardiology

## 2012-07-04 ENCOUNTER — Ambulatory Visit (INDEPENDENT_AMBULATORY_CARE_PROVIDER_SITE_OTHER): Payer: Medicare Other | Admitting: Cardiology

## 2012-07-04 VITALS — BP 134/64 | HR 75 | Ht 63.0 in | Wt 184.0 lb

## 2012-07-04 DIAGNOSIS — E785 Hyperlipidemia, unspecified: Secondary | ICD-10-CM | POA: Diagnosis not present

## 2012-07-04 DIAGNOSIS — I2510 Atherosclerotic heart disease of native coronary artery without angina pectoris: Secondary | ICD-10-CM

## 2012-07-04 DIAGNOSIS — I10 Essential (primary) hypertension: Secondary | ICD-10-CM | POA: Diagnosis not present

## 2012-07-04 NOTE — Progress Notes (Signed)
HPI  The patient is seen for followup her cardiac status. She has been followed by our group for many many years. Most recently she has been followed by Dr. Lutricia Feil. I explained to her that he was no longer with our group but that she could see him in [place] if she would like and we would help with this. She said that she has seen many different doctors in our group over time and she would like to stay with our group.  The patient is not having any significant cardiac symptoms. She has history of a coronary intervention in the past. She had a stent to the right coronary artery in 2004. Catheterization in 2005 revealed that the stent was patent. Cardiolite C. In 2008 and 2012 revealed no ischemia. She has a normal ejection fraction. She is fully active having no significant symptoms.  No Known Allergies  Current Outpatient Prescriptions  Medication Sig Dispense Refill  . acetaminophen (TYLENOL) 500 MG tablet Take 500 mg by mouth every 6 (six) hours as needed.      Marland Kitchen amLODipine (NORVASC) 10 MG tablet Take 10 mg by mouth daily.        Marland Kitchen aspirin EC 81 MG tablet Take 81 mg by mouth daily.        . carbamazepine (TEGRETOL XR) 200 MG 12 hr tablet Take 200 mg by mouth daily.        . chlorthalidone (HYGROTON) 25 MG tablet TAKE ONE TABLET BY MOUTH EVERY DAY  30 tablet  0  . doxylamine, Sleep, (EQ SLEEP AID) 25 MG tablet Take 25 mg by mouth at bedtime as needed.      Marland Kitchen HYDROcodone-acetaminophen (LORTAB) 7.5-500 MG per tablet Take 1 tablet by mouth every 6 (six) hours as needed.      . isosorbide mononitrate (IMDUR) 60 MG 24 hr tablet Take 60 mg by mouth daily.      Marland Kitchen lisinopril (PRINIVIL,ZESTRIL) 10 MG tablet TAKE ONE TABLET BY MOUTH EVERY DAY  90 tablet  3  . lovastatin (MEVACOR) 20 MG tablet TAKE ONE TABLET BY MOUTH AT BEDTIME  90 tablet  0  . metoprolol (LOPRESSOR) 50 MG tablet Take 50 mg by mouth daily.      . Multiple Vitamins-Minerals (MULTIVITAL) tablet Take 1 tablet by mouth daily.        .  nitroGLYCERIN (NITROSTAT) 0.4 MG SL tablet Place 0.4 mg under the tongue every 5 (five) minutes as needed.        Marland Kitchen omeprazole (PRILOSEC) 20 MG capsule Take 20 mg by mouth daily.        . vitamin B-12 (CYANOCOBALAMIN) 250 MCG tablet Take 250 mcg by mouth daily.        Marland Kitchen DISCONTD: metoprolol (TOPROL-XL) 50 MG 24 hr tablet Take 50 mg by mouth daily.          History   Social History  . Marital Status: Married    Spouse Name: N/A    Number of Children: N/A  . Years of Education: N/A   Occupational History  . Not on file.   Social History Main Topics  . Smoking status: Never Smoker   . Smokeless tobacco: Never Used  . Alcohol Use: No  . Drug Use: No  . Sexually Active: Not on file   Other Topics Concern  . Not on file   Social History Narrative  . No narrative on file    Family History  Problem Relation Age of Onset  .  Hypertension Other   . Diabetes Other     Past Medical History  Diagnosis Date  . Peripheral vascular disease, unspecified     Minimal internal carotid artery disease  . Unspecified essential hypertension   . Other and unspecified hyperlipidemia   . Coronary atherosclerosis of native coronary artery     Status post stent to right coronary 2004, catheterization 2005 nonobstructive CAD, normal LV function, Cardiolite study 2008 negative for ischemia. Residual moderate ostial RCA disease. Stress testing 2012 negative for ischemia ejection fraction 78%  . Pulmonary nodule   . Carotid artery disease     with an occluded right vertebral artery, otherwise nonobstructive carotid artery disease  . Nerve entrapment syndrome of lower extremity      status post prior back surgery and weakness in left leg    Past Surgical History  Procedure Date  . Kidney surgery 2010    1/4 of right kidney removed [facility]  . Cardiac catheterization     nonobstructive coronary artery disease    Patient Active Problem List  Diagnosis  . HYPERLIPIDEMIA-MIXED  . HYPERTENSION,  UNSPECIFIED  . Peripheral vascular disease, unspecified  . Coronary atherosclerosis of native coronary artery  . Carotid artery disease  . Hyponatremia  . Insomnia  . Nerve entrapment syndrome of lower extremity  . Otitis externa  . Neuropathic pain of lower extremity    ROS   Patient denies fever, chills, headache, sweats, rash, change in vision, change in hearing, chest pain, cough, nausea vomiting, urinary symptoms. All other systems are reviewed and are negative.  PHYSICAL EXAM  Patient is a delightful active intelligent 78 year old female. She is oriented to person time and place. Affect is normal. There is no jugulovenous distention. Lungs are clear. Respiratory effort is nonlabored. Cardiac exam reveals S1 and S2. There no clicks or significant murmurs. The abdomen is soft. There is no peripheral edema.  Filed Vitals:   07/04/12 1436  BP: 134/64  Pulse: 75  Height: 5\' 3"  (1.6 m)  Weight: 184 lb (83.462 kg)     ASSESSMENT & PLAN

## 2012-07-04 NOTE — Assessment & Plan Note (Signed)
Blood pressure is controlled. No change in therapy. 

## 2012-07-04 NOTE — Patient Instructions (Signed)
Continue all current medications. Your physician wants you to follow up in: 6 months.  You will receive a reminder letter in the mail one-two months in advance.  If you don't receive a letter, please call our office to schedule the follow up appointment   

## 2012-07-04 NOTE — Assessment & Plan Note (Signed)
Lipids are treated. No change in therapy.

## 2012-07-04 NOTE — Assessment & Plan Note (Signed)
Coronary disease is stable. Her last nuclear scan showed no ischemia in 2012. No further workup at this time.

## 2012-07-05 DIAGNOSIS — G608 Other hereditary and idiopathic neuropathies: Secondary | ICD-10-CM | POA: Diagnosis not present

## 2012-07-05 DIAGNOSIS — G5790 Unspecified mononeuropathy of unspecified lower limb: Secondary | ICD-10-CM | POA: Diagnosis not present

## 2012-07-05 DIAGNOSIS — M79609 Pain in unspecified limb: Secondary | ICD-10-CM | POA: Diagnosis not present

## 2012-07-31 ENCOUNTER — Other Ambulatory Visit: Payer: Self-pay | Admitting: Cardiology

## 2012-08-02 DIAGNOSIS — M79609 Pain in unspecified limb: Secondary | ICD-10-CM | POA: Diagnosis not present

## 2012-08-02 DIAGNOSIS — G5790 Unspecified mononeuropathy of unspecified lower limb: Secondary | ICD-10-CM | POA: Diagnosis not present

## 2012-08-02 DIAGNOSIS — G608 Other hereditary and idiopathic neuropathies: Secondary | ICD-10-CM | POA: Diagnosis not present

## 2012-08-21 ENCOUNTER — Other Ambulatory Visit: Payer: Self-pay | Admitting: Physician Assistant

## 2012-08-30 DIAGNOSIS — M79609 Pain in unspecified limb: Secondary | ICD-10-CM | POA: Diagnosis not present

## 2012-08-30 DIAGNOSIS — G608 Other hereditary and idiopathic neuropathies: Secondary | ICD-10-CM | POA: Diagnosis not present

## 2012-08-30 DIAGNOSIS — G5790 Unspecified mononeuropathy of unspecified lower limb: Secondary | ICD-10-CM | POA: Diagnosis not present

## 2012-08-31 DIAGNOSIS — E559 Vitamin D deficiency, unspecified: Secondary | ICD-10-CM | POA: Diagnosis not present

## 2012-08-31 DIAGNOSIS — E782 Mixed hyperlipidemia: Secondary | ICD-10-CM | POA: Diagnosis not present

## 2012-08-31 DIAGNOSIS — M1990 Unspecified osteoarthritis, unspecified site: Secondary | ICD-10-CM | POA: Diagnosis not present

## 2012-08-31 DIAGNOSIS — K219 Gastro-esophageal reflux disease without esophagitis: Secondary | ICD-10-CM | POA: Diagnosis not present

## 2012-08-31 DIAGNOSIS — I10 Essential (primary) hypertension: Secondary | ICD-10-CM | POA: Diagnosis not present

## 2012-09-04 DIAGNOSIS — I200 Unstable angina: Secondary | ICD-10-CM | POA: Diagnosis not present

## 2012-09-04 DIAGNOSIS — K219 Gastro-esophageal reflux disease without esophagitis: Secondary | ICD-10-CM | POA: Diagnosis not present

## 2012-09-04 DIAGNOSIS — R197 Diarrhea, unspecified: Secondary | ICD-10-CM | POA: Diagnosis not present

## 2012-09-04 DIAGNOSIS — M1990 Unspecified osteoarthritis, unspecified site: Secondary | ICD-10-CM | POA: Diagnosis not present

## 2012-09-04 DIAGNOSIS — I10 Essential (primary) hypertension: Secondary | ICD-10-CM | POA: Diagnosis not present

## 2012-09-04 DIAGNOSIS — E871 Hypo-osmolality and hyponatremia: Secondary | ICD-10-CM | POA: Diagnosis not present

## 2012-09-04 DIAGNOSIS — G40909 Epilepsy, unspecified, not intractable, without status epilepticus: Secondary | ICD-10-CM | POA: Diagnosis not present

## 2012-09-04 DIAGNOSIS — E782 Mixed hyperlipidemia: Secondary | ICD-10-CM | POA: Diagnosis not present

## 2012-09-11 ENCOUNTER — Other Ambulatory Visit: Payer: Self-pay | Admitting: Physician Assistant

## 2012-09-12 ENCOUNTER — Ambulatory Visit (INDEPENDENT_AMBULATORY_CARE_PROVIDER_SITE_OTHER): Payer: Medicare Other | Admitting: Ophthalmology

## 2012-09-12 DIAGNOSIS — H3530 Unspecified macular degeneration: Secondary | ICD-10-CM

## 2012-09-12 DIAGNOSIS — H35039 Hypertensive retinopathy, unspecified eye: Secondary | ICD-10-CM

## 2012-09-12 DIAGNOSIS — D3130 Benign neoplasm of unspecified choroid: Secondary | ICD-10-CM | POA: Diagnosis not present

## 2012-09-12 DIAGNOSIS — H43819 Vitreous degeneration, unspecified eye: Secondary | ICD-10-CM

## 2012-09-12 DIAGNOSIS — I10 Essential (primary) hypertension: Secondary | ICD-10-CM

## 2012-10-25 DIAGNOSIS — M76899 Other specified enthesopathies of unspecified lower limb, excluding foot: Secondary | ICD-10-CM | POA: Diagnosis not present

## 2012-11-01 DIAGNOSIS — G608 Other hereditary and idiopathic neuropathies: Secondary | ICD-10-CM | POA: Diagnosis not present

## 2012-11-01 DIAGNOSIS — M79609 Pain in unspecified limb: Secondary | ICD-10-CM | POA: Diagnosis not present

## 2012-11-01 DIAGNOSIS — G5790 Unspecified mononeuropathy of unspecified lower limb: Secondary | ICD-10-CM | POA: Diagnosis not present

## 2012-12-05 DIAGNOSIS — C649 Malignant neoplasm of unspecified kidney, except renal pelvis: Secondary | ICD-10-CM | POA: Diagnosis not present

## 2012-12-05 DIAGNOSIS — Z9889 Other specified postprocedural states: Secondary | ICD-10-CM | POA: Diagnosis not present

## 2012-12-05 DIAGNOSIS — Q619 Cystic kidney disease, unspecified: Secondary | ICD-10-CM | POA: Diagnosis not present

## 2012-12-06 DIAGNOSIS — K219 Gastro-esophageal reflux disease without esophagitis: Secondary | ICD-10-CM | POA: Diagnosis not present

## 2012-12-06 DIAGNOSIS — I10 Essential (primary) hypertension: Secondary | ICD-10-CM | POA: Diagnosis not present

## 2012-12-06 DIAGNOSIS — E871 Hypo-osmolality and hyponatremia: Secondary | ICD-10-CM | POA: Diagnosis not present

## 2012-12-06 DIAGNOSIS — E782 Mixed hyperlipidemia: Secondary | ICD-10-CM | POA: Diagnosis not present

## 2012-12-06 DIAGNOSIS — G40909 Epilepsy, unspecified, not intractable, without status epilepticus: Secondary | ICD-10-CM | POA: Diagnosis not present

## 2012-12-06 DIAGNOSIS — M1990 Unspecified osteoarthritis, unspecified site: Secondary | ICD-10-CM | POA: Diagnosis not present

## 2012-12-06 DIAGNOSIS — I200 Unstable angina: Secondary | ICD-10-CM | POA: Diagnosis not present

## 2012-12-18 ENCOUNTER — Other Ambulatory Visit: Payer: Self-pay | Admitting: Cardiology

## 2012-12-18 MED ORDER — CHLORTHALIDONE 25 MG PO TABS: 25.0000 mg | ORAL_TABLET | Freq: Every day | ORAL | Status: DC

## 2012-12-19 ENCOUNTER — Telehealth: Payer: Self-pay | Admitting: *Deleted

## 2012-12-19 DIAGNOSIS — J018 Other acute sinusitis: Secondary | ICD-10-CM | POA: Diagnosis not present

## 2012-12-19 DIAGNOSIS — R079 Chest pain, unspecified: Secondary | ICD-10-CM | POA: Diagnosis not present

## 2012-12-19 DIAGNOSIS — Z79899 Other long term (current) drug therapy: Secondary | ICD-10-CM | POA: Diagnosis not present

## 2012-12-19 DIAGNOSIS — I10 Essential (primary) hypertension: Secondary | ICD-10-CM | POA: Diagnosis not present

## 2012-12-19 DIAGNOSIS — Z7982 Long term (current) use of aspirin: Secondary | ICD-10-CM | POA: Diagnosis not present

## 2012-12-19 DIAGNOSIS — R0789 Other chest pain: Secondary | ICD-10-CM | POA: Diagnosis not present

## 2012-12-19 DIAGNOSIS — I519 Heart disease, unspecified: Secondary | ICD-10-CM | POA: Diagnosis not present

## 2012-12-19 DIAGNOSIS — H6690 Otitis media, unspecified, unspecified ear: Secondary | ICD-10-CM | POA: Diagnosis not present

## 2012-12-19 DIAGNOSIS — R071 Chest pain on breathing: Secondary | ICD-10-CM | POA: Diagnosis not present

## 2012-12-19 DIAGNOSIS — H65199 Other acute nonsuppurative otitis media, unspecified ear: Secondary | ICD-10-CM | POA: Diagnosis not present

## 2012-12-19 DIAGNOSIS — H66009 Acute suppurative otitis media without spontaneous rupture of ear drum, unspecified ear: Secondary | ICD-10-CM | POA: Diagnosis not present

## 2012-12-19 NOTE — Telephone Encounter (Signed)
C/O chest pain, pressure, tightness.  Also pain under rib cage area.  States that this has happened before, but usually goes away and not as aggravating.  Also, has not taken any NTG because she is out.  States husband is home with her.  Advised her to come to ED for evaluation.  States she prefers to come to [facility] as opposed to [place].

## 2012-12-24 ENCOUNTER — Encounter: Payer: Self-pay | Admitting: Cardiology

## 2012-12-24 DIAGNOSIS — R9430 Abnormal result of cardiovascular function study, unspecified: Secondary | ICD-10-CM | POA: Insufficient documentation

## 2012-12-27 ENCOUNTER — Ambulatory Visit (INDEPENDENT_AMBULATORY_CARE_PROVIDER_SITE_OTHER): Payer: Medicare Other | Admitting: Cardiology

## 2012-12-27 ENCOUNTER — Encounter: Payer: Self-pay | Admitting: Cardiology

## 2012-12-27 VITALS — BP 110/61 | HR 71 | Ht 61.5 in | Wt 182.0 lb

## 2012-12-27 DIAGNOSIS — E785 Hyperlipidemia, unspecified: Secondary | ICD-10-CM

## 2012-12-27 DIAGNOSIS — G608 Other hereditary and idiopathic neuropathies: Secondary | ICD-10-CM | POA: Diagnosis not present

## 2012-12-27 DIAGNOSIS — I779 Disorder of arteries and arterioles, unspecified: Secondary | ICD-10-CM | POA: Diagnosis not present

## 2012-12-27 DIAGNOSIS — G5790 Unspecified mononeuropathy of unspecified lower limb: Secondary | ICD-10-CM | POA: Diagnosis not present

## 2012-12-27 DIAGNOSIS — I2510 Atherosclerotic heart disease of native coronary artery without angina pectoris: Secondary | ICD-10-CM

## 2012-12-27 DIAGNOSIS — I10 Essential (primary) hypertension: Secondary | ICD-10-CM | POA: Diagnosis not present

## 2012-12-27 DIAGNOSIS — M79609 Pain in unspecified limb: Secondary | ICD-10-CM | POA: Diagnosis not present

## 2012-12-27 NOTE — Assessment & Plan Note (Signed)
Lipids are being treated by her primary M.D.

## 2012-12-27 NOTE — Assessment & Plan Note (Signed)
Blood pressures control. No change in therapy. 

## 2012-12-27 NOTE — Progress Notes (Signed)
HPI  This remarkable 79 year old lady returns for cardiology followup. She is doing very well. She has no cardiac symptoms. There is no chest pain or shortness of breath. She has some intermittent neck discomfort that sounds neurologic. There is been no syncope or presyncope. Historically she had a coronary intervention in 2004. Cath in 2005 revealed that the stent was patent. She had a nuclear scan last in 2012 that revealed no significant ischemia with a normal ejection fraction. She is fully active.  No Known Allergies  Current Outpatient Prescriptions  Medication Sig Dispense Refill  . amoxicillin (AMOXIL) 500 MG capsule Take 500 mg by mouth 3 (three) times daily.      Marland Kitchen aspirin EC 81 MG tablet Take 81 mg by mouth daily.        . carbamazepine (TEGRETOL XR) 200 MG 12 hr tablet Take 200 mg by mouth daily.        . chlorthalidone (HYGROTON) 25 MG tablet Take 1 tablet (25 mg total) by mouth daily.  30 tablet  2  . doxylamine, Sleep, (EQ SLEEP AID) 25 MG tablet Take 25 mg by mouth at bedtime as needed.      . isosorbide mononitrate (IMDUR) 60 MG 24 hr tablet Take 120 mg by mouth daily.       Marland Kitchen lisinopril (PRINIVIL,ZESTRIL) 10 MG tablet       . metoprolol (LOPRESSOR) 50 MG tablet Take 50 mg by mouth daily.      . Multiple Vitamins-Minerals (MULTIVITAL) tablet Take 1 tablet by mouth daily.        . nitroGLYCERIN (NITROSTAT) 0.4 MG SL tablet Place 0.4 mg under the tongue every 5 (five) minutes as needed.        Marland Kitchen omeprazole (PRILOSEC) 20 MG capsule Take 20 mg by mouth daily.        . pravastatin (PRAVACHOL) 40 MG tablet Take 40 mg by mouth daily.      . vitamin B-12 (CYANOCOBALAMIN) 250 MCG tablet Take 250 mcg by mouth daily.        . vitamin E 400 UNIT capsule Take 400 Units by mouth daily.      . [DISCONTINUED] metoprolol (TOPROL-XL) 50 MG 24 hr tablet Take 50 mg by mouth daily.         No current facility-administered medications for this visit.    History   Social History  . Marital  Status: Married    Spouse Name: N/A    Number of Children: N/A  . Years of Education: N/A   Occupational History  . Not on file.   Social History Main Topics  . Smoking status: Never Smoker   . Smokeless tobacco: Never Used  . Alcohol Use: No  . Drug Use: No  . Sexually Active: Not on file   Other Topics Concern  . Not on file   Social History Narrative  . No narrative on file    Family History  Problem Relation Age of Onset  . Hypertension Other   . Diabetes Other     Past Medical History  Diagnosis Date  . Peripheral vascular disease, unspecified     Minimal internal carotid artery disease  . Unspecified essential hypertension   . Other and unspecified hyperlipidemia   . Coronary atherosclerosis of native coronary artery     Status post stent to right coronary 2004, catheterization 2005 nonobstructive CAD, normal LV function, Cardiolite study 2008 negative for ischemia. Residual moderate ostial RCA disease. Stress testing 2012 negative for  ischemia ejection fraction 78%  . Pulmonary nodule   . Carotid artery disease     with an occluded right vertebral artery, otherwise nonobstructive carotid artery disease  . Nerve entrapment syndrome of lower extremity      status post prior back surgery and weakness in left leg  . Ejection fraction     LV function normal by nuclear,  2012    Past Surgical History  Procedure Laterality Date  . Kidney surgery  2010    1/4 of right kidney removed [facility]  . Cardiac catheterization      nonobstructive coronary artery disease    Patient Active Problem List  Diagnosis  . HYPERLIPIDEMIA-MIXED  . HYPERTENSION, UNSPECIFIED  . Peripheral vascular disease, unspecified  . Coronary atherosclerosis of native coronary artery  . Carotid artery disease  . Hyponatremia  . Insomnia  . Nerve entrapment syndrome of lower extremity  . Otitis externa  . Neuropathic pain of lower extremity  . Ejection fraction    ROS    Patient  denies fever, chills, headache, sweats, rash, change in vision, change in hearing, chest pain, cough, nausea vomiting, urinary symptoms. All other systems are reviewed and are negative other than the history of present illness.  PHYSICAL EXAM  Patient looks much younger than her stated age. She is oriented to person time and place. Affect is normal. There is no jugulovenous distention. Lungs are clear. Respiratory effort is nonlabored. Cardiac exam reveals S1 and S2. There no clicks or significant murmurs. The abdomen is soft. There is no peripheral edema.  Filed Vitals:   12/27/12 1316  BP: 110/61  Pulse: 71  Height: 5' 1.5" (1.562 m)  Weight: 182 lb (82.555 kg)     ASSESSMENT & PLAN

## 2012-12-27 NOTE — Patient Instructions (Addendum)

## 2012-12-27 NOTE — Assessment & Plan Note (Signed)
There are no bruits heard. No further workup at this time.

## 2012-12-27 NOTE — Assessment & Plan Note (Signed)
Her coronary status is stable. No further workup. I will plan to see her back in one year in followup

## 2013-02-12 DIAGNOSIS — I10 Essential (primary) hypertension: Secondary | ICD-10-CM | POA: Diagnosis not present

## 2013-02-12 DIAGNOSIS — Z9861 Coronary angioplasty status: Secondary | ICD-10-CM | POA: Diagnosis not present

## 2013-02-12 DIAGNOSIS — R42 Dizziness and giddiness: Secondary | ICD-10-CM | POA: Diagnosis not present

## 2013-02-12 DIAGNOSIS — Z8249 Family history of ischemic heart disease and other diseases of the circulatory system: Secondary | ICD-10-CM | POA: Diagnosis not present

## 2013-02-12 DIAGNOSIS — I519 Heart disease, unspecified: Secondary | ICD-10-CM | POA: Diagnosis not present

## 2013-02-12 DIAGNOSIS — G459 Transient cerebral ischemic attack, unspecified: Secondary | ICD-10-CM | POA: Diagnosis not present

## 2013-02-12 DIAGNOSIS — Z7982 Long term (current) use of aspirin: Secondary | ICD-10-CM | POA: Diagnosis not present

## 2013-02-12 DIAGNOSIS — E7800 Pure hypercholesterolemia, unspecified: Secondary | ICD-10-CM | POA: Diagnosis not present

## 2013-02-12 DIAGNOSIS — Z79899 Other long term (current) drug therapy: Secondary | ICD-10-CM | POA: Diagnosis not present

## 2013-02-21 DIAGNOSIS — G5790 Unspecified mononeuropathy of unspecified lower limb: Secondary | ICD-10-CM | POA: Diagnosis not present

## 2013-02-21 DIAGNOSIS — M79609 Pain in unspecified limb: Secondary | ICD-10-CM | POA: Diagnosis not present

## 2013-02-21 DIAGNOSIS — G608 Other hereditary and idiopathic neuropathies: Secondary | ICD-10-CM | POA: Diagnosis not present

## 2013-03-09 DIAGNOSIS — M1990 Unspecified osteoarthritis, unspecified site: Secondary | ICD-10-CM | POA: Diagnosis not present

## 2013-03-09 DIAGNOSIS — E871 Hypo-osmolality and hyponatremia: Secondary | ICD-10-CM | POA: Diagnosis not present

## 2013-03-09 DIAGNOSIS — E782 Mixed hyperlipidemia: Secondary | ICD-10-CM | POA: Diagnosis not present

## 2013-03-09 DIAGNOSIS — E559 Vitamin D deficiency, unspecified: Secondary | ICD-10-CM | POA: Diagnosis not present

## 2013-03-09 DIAGNOSIS — I200 Unstable angina: Secondary | ICD-10-CM | POA: Diagnosis not present

## 2013-03-09 DIAGNOSIS — G40909 Epilepsy, unspecified, not intractable, without status epilepticus: Secondary | ICD-10-CM | POA: Diagnosis not present

## 2013-03-09 DIAGNOSIS — I10 Essential (primary) hypertension: Secondary | ICD-10-CM | POA: Diagnosis not present

## 2013-03-09 DIAGNOSIS — K219 Gastro-esophageal reflux disease without esophagitis: Secondary | ICD-10-CM | POA: Diagnosis not present

## 2013-03-13 ENCOUNTER — Ambulatory Visit (INDEPENDENT_AMBULATORY_CARE_PROVIDER_SITE_OTHER): Payer: Medicare Other | Admitting: Ophthalmology

## 2013-03-13 DIAGNOSIS — H3530 Unspecified macular degeneration: Secondary | ICD-10-CM

## 2013-03-13 DIAGNOSIS — I10 Essential (primary) hypertension: Secondary | ICD-10-CM

## 2013-03-13 DIAGNOSIS — H43819 Vitreous degeneration, unspecified eye: Secondary | ICD-10-CM

## 2013-03-13 DIAGNOSIS — H35039 Hypertensive retinopathy, unspecified eye: Secondary | ICD-10-CM | POA: Diagnosis not present

## 2013-03-13 DIAGNOSIS — D3130 Benign neoplasm of unspecified choroid: Secondary | ICD-10-CM

## 2013-03-16 DIAGNOSIS — K219 Gastro-esophageal reflux disease without esophagitis: Secondary | ICD-10-CM | POA: Diagnosis not present

## 2013-03-16 DIAGNOSIS — M1990 Unspecified osteoarthritis, unspecified site: Secondary | ICD-10-CM | POA: Diagnosis not present

## 2013-03-16 DIAGNOSIS — I200 Unstable angina: Secondary | ICD-10-CM | POA: Diagnosis not present

## 2013-03-16 DIAGNOSIS — G40909 Epilepsy, unspecified, not intractable, without status epilepticus: Secondary | ICD-10-CM | POA: Diagnosis not present

## 2013-03-16 DIAGNOSIS — E782 Mixed hyperlipidemia: Secondary | ICD-10-CM | POA: Diagnosis not present

## 2013-03-16 DIAGNOSIS — N189 Chronic kidney disease, unspecified: Secondary | ICD-10-CM | POA: Diagnosis not present

## 2013-03-16 DIAGNOSIS — I10 Essential (primary) hypertension: Secondary | ICD-10-CM | POA: Diagnosis not present

## 2013-03-16 DIAGNOSIS — E871 Hypo-osmolality and hyponatremia: Secondary | ICD-10-CM | POA: Diagnosis not present

## 2013-03-26 DIAGNOSIS — Z1231 Encounter for screening mammogram for malignant neoplasm of breast: Secondary | ICD-10-CM | POA: Diagnosis not present

## 2013-03-28 DIAGNOSIS — G608 Other hereditary and idiopathic neuropathies: Secondary | ICD-10-CM | POA: Diagnosis not present

## 2013-03-28 DIAGNOSIS — G5790 Unspecified mononeuropathy of unspecified lower limb: Secondary | ICD-10-CM | POA: Diagnosis not present

## 2013-03-28 DIAGNOSIS — M79609 Pain in unspecified limb: Secondary | ICD-10-CM | POA: Diagnosis not present

## 2013-04-25 DIAGNOSIS — M545 Low back pain, unspecified: Secondary | ICD-10-CM | POA: Diagnosis not present

## 2013-04-25 DIAGNOSIS — M25559 Pain in unspecified hip: Secondary | ICD-10-CM | POA: Diagnosis not present

## 2013-04-25 DIAGNOSIS — M48061 Spinal stenosis, lumbar region without neurogenic claudication: Secondary | ICD-10-CM | POA: Diagnosis not present

## 2013-04-25 DIAGNOSIS — M47817 Spondylosis without myelopathy or radiculopathy, lumbosacral region: Secondary | ICD-10-CM | POA: Diagnosis not present

## 2013-05-01 DIAGNOSIS — IMO0002 Reserved for concepts with insufficient information to code with codable children: Secondary | ICD-10-CM | POA: Diagnosis not present

## 2013-05-01 DIAGNOSIS — M961 Postlaminectomy syndrome, not elsewhere classified: Secondary | ICD-10-CM | POA: Diagnosis not present

## 2013-05-01 DIAGNOSIS — M48061 Spinal stenosis, lumbar region without neurogenic claudication: Secondary | ICD-10-CM | POA: Diagnosis not present

## 2013-05-02 DIAGNOSIS — G5790 Unspecified mononeuropathy of unspecified lower limb: Secondary | ICD-10-CM | POA: Diagnosis not present

## 2013-05-02 DIAGNOSIS — M79609 Pain in unspecified limb: Secondary | ICD-10-CM | POA: Diagnosis not present

## 2013-05-02 DIAGNOSIS — Z86010 Personal history of colonic polyps: Secondary | ICD-10-CM | POA: Diagnosis not present

## 2013-05-02 DIAGNOSIS — Z809 Family history of malignant neoplasm, unspecified: Secondary | ICD-10-CM | POA: Diagnosis not present

## 2013-05-02 DIAGNOSIS — G608 Other hereditary and idiopathic neuropathies: Secondary | ICD-10-CM | POA: Diagnosis not present

## 2013-05-03 DIAGNOSIS — IMO0002 Reserved for concepts with insufficient information to code with codable children: Secondary | ICD-10-CM | POA: Diagnosis not present

## 2013-05-03 DIAGNOSIS — M48061 Spinal stenosis, lumbar region without neurogenic claudication: Secondary | ICD-10-CM | POA: Diagnosis not present

## 2013-05-03 DIAGNOSIS — M961 Postlaminectomy syndrome, not elsewhere classified: Secondary | ICD-10-CM | POA: Diagnosis not present

## 2013-05-23 DIAGNOSIS — M79609 Pain in unspecified limb: Secondary | ICD-10-CM | POA: Diagnosis not present

## 2013-05-23 DIAGNOSIS — G608 Other hereditary and idiopathic neuropathies: Secondary | ICD-10-CM | POA: Diagnosis not present

## 2013-05-23 DIAGNOSIS — M48061 Spinal stenosis, lumbar region without neurogenic claudication: Secondary | ICD-10-CM | POA: Diagnosis not present

## 2013-05-23 DIAGNOSIS — IMO0002 Reserved for concepts with insufficient information to code with codable children: Secondary | ICD-10-CM | POA: Diagnosis not present

## 2013-05-23 DIAGNOSIS — M961 Postlaminectomy syndrome, not elsewhere classified: Secondary | ICD-10-CM | POA: Diagnosis not present

## 2013-05-23 DIAGNOSIS — G5790 Unspecified mononeuropathy of unspecified lower limb: Secondary | ICD-10-CM | POA: Diagnosis not present

## 2013-06-01 DIAGNOSIS — G40909 Epilepsy, unspecified, not intractable, without status epilepticus: Secondary | ICD-10-CM | POA: Diagnosis not present

## 2013-06-01 DIAGNOSIS — N189 Chronic kidney disease, unspecified: Secondary | ICD-10-CM | POA: Diagnosis not present

## 2013-06-01 DIAGNOSIS — K219 Gastro-esophageal reflux disease without esophagitis: Secondary | ICD-10-CM | POA: Diagnosis not present

## 2013-06-01 DIAGNOSIS — E782 Mixed hyperlipidemia: Secondary | ICD-10-CM | POA: Diagnosis not present

## 2013-06-01 DIAGNOSIS — E871 Hypo-osmolality and hyponatremia: Secondary | ICD-10-CM | POA: Diagnosis not present

## 2013-06-01 DIAGNOSIS — I10 Essential (primary) hypertension: Secondary | ICD-10-CM | POA: Diagnosis not present

## 2013-06-01 DIAGNOSIS — M1990 Unspecified osteoarthritis, unspecified site: Secondary | ICD-10-CM | POA: Diagnosis not present

## 2013-06-13 DIAGNOSIS — IMO0002 Reserved for concepts with insufficient information to code with codable children: Secondary | ICD-10-CM | POA: Diagnosis not present

## 2013-06-18 DIAGNOSIS — M5126 Other intervertebral disc displacement, lumbar region: Secondary | ICD-10-CM | POA: Diagnosis not present

## 2013-06-18 DIAGNOSIS — Z981 Arthrodesis status: Secondary | ICD-10-CM | POA: Diagnosis not present

## 2013-06-18 DIAGNOSIS — M48061 Spinal stenosis, lumbar region without neurogenic claudication: Secondary | ICD-10-CM | POA: Diagnosis not present

## 2013-06-20 DIAGNOSIS — G608 Other hereditary and idiopathic neuropathies: Secondary | ICD-10-CM | POA: Diagnosis not present

## 2013-06-20 DIAGNOSIS — M79609 Pain in unspecified limb: Secondary | ICD-10-CM | POA: Diagnosis not present

## 2013-06-20 DIAGNOSIS — G5790 Unspecified mononeuropathy of unspecified lower limb: Secondary | ICD-10-CM | POA: Diagnosis not present

## 2013-06-21 DIAGNOSIS — M48061 Spinal stenosis, lumbar region without neurogenic claudication: Secondary | ICD-10-CM | POA: Diagnosis not present

## 2013-06-22 ENCOUNTER — Other Ambulatory Visit: Payer: Self-pay | Admitting: Neurological Surgery

## 2013-06-25 ENCOUNTER — Other Ambulatory Visit: Payer: Self-pay | Admitting: *Deleted

## 2013-06-25 MED ORDER — CHLORTHALIDONE 25 MG PO TABS: 25.0000 mg | ORAL_TABLET | Freq: Every day | ORAL | Status: DC

## 2013-06-27 DIAGNOSIS — K219 Gastro-esophageal reflux disease without esophagitis: Secondary | ICD-10-CM | POA: Diagnosis not present

## 2013-06-27 DIAGNOSIS — I10 Essential (primary) hypertension: Secondary | ICD-10-CM | POA: Diagnosis not present

## 2013-06-27 DIAGNOSIS — N189 Chronic kidney disease, unspecified: Secondary | ICD-10-CM | POA: Diagnosis not present

## 2013-06-27 DIAGNOSIS — I200 Unstable angina: Secondary | ICD-10-CM | POA: Diagnosis not present

## 2013-06-27 DIAGNOSIS — G40909 Epilepsy, unspecified, not intractable, without status epilepticus: Secondary | ICD-10-CM | POA: Diagnosis not present

## 2013-06-27 DIAGNOSIS — E782 Mixed hyperlipidemia: Secondary | ICD-10-CM | POA: Diagnosis not present

## 2013-06-27 DIAGNOSIS — M1990 Unspecified osteoarthritis, unspecified site: Secondary | ICD-10-CM | POA: Diagnosis not present

## 2013-06-27 DIAGNOSIS — E871 Hypo-osmolality and hyponatremia: Secondary | ICD-10-CM | POA: Diagnosis not present

## 2013-06-27 HISTORY — PX: BACK SURGERY: SHX140

## 2013-07-12 ENCOUNTER — Encounter (HOSPITAL_COMMUNITY): Payer: Self-pay | Admitting: Pharmacy Technician

## 2013-07-14 NOTE — Pre-Procedure Instructions (Signed)
Carrie Gillespie  07/14/2013   Your procedure is scheduled on:  October 28  Report to [facility] Entrance "A" [address] at [employer] AM.  Call this number if you have problems the morning of surgery: [phone]   Remember:   Do not eat food or drink liquids after midnight.   Take these medicines the morning of surgery with A SIP OF WATER: Tegretol XR, Isosorbide, Metoprolol, Omeprazole,    STOP Aspirin, Vitamin D, Iron, Multiple Vitamins, Eye Vitamins, Naproxen, Vitamin B12 October 21   Do not take Aspirin, Aleve, Naproxen, Advil, Ibuprofen, Vitamin, Herbs, or Supplements starting October 21   Do not wear jewelry, make-up or nail polish.  Do not wear lotions, powders, or perfumes. You may wear deodorant.  Do not shave 48 hours prior to surgery. Men may shave face and neck.  Do not bring valuables to the hospital.  [facility] is not responsible                  for any belongings or valuables.               Contacts, dentures or bridgework may not be worn into surgery.  Leave suitcase in the car. After surgery it may be brought to your room.  For patients admitted to the hospital, discharge time is determined by your                treatment team.              Special Instructions: Shower using CHG 2 nights before surgery and the night before surgery.  If you shower the day of surgery use CHG.  Use special wash - you have one bottle of CHG for all showers.  You should use approximately 1/3 of the bottle for each shower.   Please read over the following fact sheets that you were given: Pain Booklet, Coughing and Deep Breathing, Blood Transfusion Information and Surgical Site Infection Prevention

## 2013-07-16 ENCOUNTER — Encounter (HOSPITAL_COMMUNITY)
Admission: RE | Admit: 2013-07-16 | Discharge: 2013-07-16 | Disposition: A | Discharge status: Home / Self Care | Payer: Medicare Other | Source: Ambulatory Visit | Attending: Neurological Surgery | Admitting: Neurological Surgery

## 2013-07-16 ENCOUNTER — Encounter (HOSPITAL_COMMUNITY): Payer: Self-pay

## 2013-07-16 ENCOUNTER — Encounter (HOSPITAL_COMMUNITY)
Admission: RE | Admit: 2013-07-16 | Discharge: 2013-07-16 | Disposition: A | Discharge status: Home / Self Care | Payer: Medicare Other | Source: Ambulatory Visit | Attending: Anesthesiology | Admitting: Anesthesiology

## 2013-07-16 DIAGNOSIS — Z01812 Encounter for preprocedural laboratory examination: Secondary | ICD-10-CM | POA: Insufficient documentation

## 2013-07-16 DIAGNOSIS — Z01810 Encounter for preprocedural cardiovascular examination: Secondary | ICD-10-CM | POA: Diagnosis not present

## 2013-07-16 DIAGNOSIS — Z01818 Encounter for other preprocedural examination: Secondary | ICD-10-CM | POA: Diagnosis not present

## 2013-07-16 HISTORY — DX: Unspecified osteoarthritis, unspecified site: M19.90

## 2013-07-16 LAB — BASIC METABOLIC PANEL
BUN: 30 mg/dL — ABNORMAL HIGH (ref 6–23)
Calcium: 9.3 mg/dL (ref 8.4–10.5)
Chloride: 89 mEq/L — ABNORMAL LOW (ref 96–112)
GFR calc Af Amer: 45 mL/min — ABNORMAL LOW (ref >90)
GFR calc non Af Amer: 39 mL/min — ABNORMAL LOW (ref >90)
Potassium: 4.4 mEq/L (ref 3.5–5.1)
Sodium: 126 mEq/L — ABNORMAL LOW (ref 135–145)

## 2013-07-16 LAB — SURGICAL PCR SCREEN
MRSA, PCR: NEGATIVE
Staphylococcus aureus: NEGATIVE

## 2013-07-16 LAB — TYPE AND SCREEN
ABO/RH(D): A POS
Antibody Screen: NEGATIVE

## 2013-07-16 LAB — CBC
MCH: 30.6 pg (ref 26.0–34.0)
MCHC: 34.4 g/dL (ref 30.0–36.0)
Platelets: 219 10*3/uL (ref 150–400)
RBC: 3.43 MIL/uL — ABNORMAL LOW (ref 3.87–5.11)
WBC: 7 10*3/uL (ref 4.0–10.5)

## 2013-07-16 LAB — ABO/RH: ABO/RH(D): A POS

## 2013-07-17 NOTE — Progress Notes (Addendum)
Anesthesia Chart Review:  Patient is a 79 year old female scheduled for L3-4 PLIF on 07/24/13 by Dr. Ellene Route.  History includes non-smoker, PAD (mild carotid occlusive disease (0-39% by duplex in '06); no further work-up at this time per 12/27/12 cardiology note), HTN, HLD, CAD s/p mid RCA stent '00, arthritis, right partial nephrectomy [facility], '10), left TKA '04, partial hysterectomy, cataract extraction. Patient lives in [place], [place]. PCP is listed as Dr. Judd Lien.  Cardiologist is Dr. Dola Argyle, last visit 12/27/12.   EKG on 07/16/13 showed SR with first degree AVB, septal infarct (age undetermined).  Lower r wave in V3, otherwise stable when compared to her EKG from 11/24/09.  Nuclear stress test on 05/25/11 [facility]) showed probably normal LV perfusion.  Limitations and artifact were due to breast tissue.  Mild, apical anteroseptal defect that is fixed, suggestive of soft tissue attenuation. No large area os ischemia.  Calculated post stress LVEF was 78%.  Echo on 03/05/08 [facility]) showed estimated EF 55-60%, mild concentric LVH, mitral annular calcification, mild MR.  Her last cardiac cath was on 09/06/03 and showed normal LV systolic function, patent mid RCA stent with less than 20% stenosis,  70-75% ostial stenosis of the RCA, 30% proximal RCA. Normal LM.  40% proximal LAD, 25% mid LAD, 40% ostial D1. LCx with small OM and very large ramus with 30% proximal stenosis.  Preoperative labs noted.  Na 126. (She is on chlorthalidone.) Cr 1.18.  H/H 10.5/30.5.  T&S done.   I reviewed above with anesthesiologist Dr. Tobias Alexander.  She has been evaluated by cardiology approximately six months ago and was doing well at that time.  She had a low risk stress test in 2012.  However, she does have hyponatremia with a Na of 126.  Dr. Pleas Koch will need to be notified of plans for surgery and given labs to review for preoperative recommendations.  I have notified Jessica at Dr. Clarice Pole office.  She  will contact Dr. Lizbeth Bark office.  I'll follow-up once additional input available.  [facility] Short Stay Center/Anesthesiology Phone [phone] 07/17/2013 1:27 PM  Addendum: 07/20/2013 4:40 PM On 07/19/2013 I spoke with Janett Billow from Dr. Clarice Pole office. She received a call from Dr. Pleas Koch earlier that day who reportedly stated that patient has chronic hyponatremia with Na+ levels ranging from 121-133.  Since her hyponatremia is chronic, he was not recommending further evaluation/intervention at this time. To her understanding, he was not stopping her chlorthalidone. He apparently also discussed this with an anesthesiologist at [facility] as well.  He is planning to dictate a note to fax to Dr. Clarice Pole office regarding chronic hyponatremia and plans for surgery.  Janett Billow will fax the note to PAT once received.  I have updated anesthesiologist Dr. Linna Caprice.  Will plan to get an ISTAT on the day of surgery to ensure no further decrease in her sodium.

## 2013-07-23 MED ORDER — CEFAZOLIN SODIUM-DEXTROSE 2-3 GM-% IV SOLR
2.0000 g | INTRAVENOUS | Status: AC
  Administered 2013-07-24: 2 g via INTRAVENOUS
  Filled 2013-07-23: qty 50

## 2013-07-24 ENCOUNTER — Encounter (HOSPITAL_COMMUNITY): Payer: Medicare Other | Admitting: Vascular Surgery

## 2013-07-24 ENCOUNTER — Inpatient Hospital Stay (HOSPITAL_COMMUNITY): Payer: Medicare Other | Admitting: Anesthesiology

## 2013-07-24 ENCOUNTER — Inpatient Hospital Stay (HOSPITAL_COMMUNITY)
Admission: RE | Admit: 2013-07-24 | Discharge: 2013-08-01 | DRG: 460 | Disposition: A | Discharge status: Skilled Nursing Facility | Payer: Medicare Other | Source: Ambulatory Visit | Attending: Neurological Surgery | Admitting: Neurological Surgery

## 2013-07-24 ENCOUNTER — Encounter (HOSPITAL_COMMUNITY)
Admission: RE | Disposition: A | Discharge status: Skilled Nursing Facility | Payer: Medicare Other | Source: Ambulatory Visit | Attending: Neurological Surgery

## 2013-07-24 ENCOUNTER — Inpatient Hospital Stay (HOSPITAL_COMMUNITY): Payer: Medicare Other

## 2013-07-24 ENCOUNTER — Encounter (HOSPITAL_COMMUNITY): Payer: Self-pay | Admitting: *Deleted

## 2013-07-24 DIAGNOSIS — I6529 Occlusion and stenosis of unspecified carotid artery: Secondary | ICD-10-CM | POA: Diagnosis present

## 2013-07-24 DIAGNOSIS — M545 Low back pain, unspecified: Secondary | ICD-10-CM | POA: Diagnosis not present

## 2013-07-24 DIAGNOSIS — B9689 Other specified bacterial agents as the cause of diseases classified elsewhere: Secondary | ICD-10-CM | POA: Diagnosis not present

## 2013-07-24 DIAGNOSIS — R569 Unspecified convulsions: Secondary | ICD-10-CM | POA: Diagnosis not present

## 2013-07-24 DIAGNOSIS — M4310 Spondylolisthesis, site unspecified: Secondary | ICD-10-CM | POA: Diagnosis present

## 2013-07-24 DIAGNOSIS — M5137 Other intervertebral disc degeneration, lumbosacral region: Secondary | ICD-10-CM | POA: Diagnosis not present

## 2013-07-24 DIAGNOSIS — M6281 Muscle weakness (generalized): Secondary | ICD-10-CM | POA: Diagnosis not present

## 2013-07-24 DIAGNOSIS — R262 Difficulty in walking, not elsewhere classified: Secondary | ICD-10-CM | POA: Diagnosis not present

## 2013-07-24 DIAGNOSIS — M48061 Spinal stenosis, lumbar region without neurogenic claudication: Secondary | ICD-10-CM | POA: Diagnosis not present

## 2013-07-24 DIAGNOSIS — Z96659 Presence of unspecified artificial knee joint: Secondary | ICD-10-CM | POA: Diagnosis not present

## 2013-07-24 DIAGNOSIS — I10 Essential (primary) hypertension: Secondary | ICD-10-CM | POA: Diagnosis present

## 2013-07-24 DIAGNOSIS — R112 Nausea with vomiting, unspecified: Secondary | ICD-10-CM

## 2013-07-24 DIAGNOSIS — I2510 Atherosclerotic heart disease of native coronary artery without angina pectoris: Secondary | ICD-10-CM

## 2013-07-24 DIAGNOSIS — I9589 Other hypotension: Secondary | ICD-10-CM | POA: Diagnosis not present

## 2013-07-24 DIAGNOSIS — M159 Polyosteoarthritis, unspecified: Secondary | ICD-10-CM | POA: Diagnosis not present

## 2013-07-24 DIAGNOSIS — Z79899 Other long term (current) drug therapy: Secondary | ICD-10-CM | POA: Diagnosis not present

## 2013-07-24 DIAGNOSIS — R001 Bradycardia, unspecified: Secondary | ICD-10-CM

## 2013-07-24 DIAGNOSIS — J984 Other disorders of lung: Secondary | ICD-10-CM | POA: Diagnosis not present

## 2013-07-24 DIAGNOSIS — I517 Cardiomegaly: Secondary | ICD-10-CM | POA: Diagnosis not present

## 2013-07-24 DIAGNOSIS — H409 Unspecified glaucoma: Secondary | ICD-10-CM | POA: Diagnosis not present

## 2013-07-24 DIAGNOSIS — R55 Syncope and collapse: Secondary | ICD-10-CM | POA: Diagnosis not present

## 2013-07-24 DIAGNOSIS — Z7982 Long term (current) use of aspirin: Secondary | ICD-10-CM | POA: Diagnosis not present

## 2013-07-24 DIAGNOSIS — I959 Hypotension, unspecified: Secondary | ICD-10-CM | POA: Diagnosis not present

## 2013-07-24 DIAGNOSIS — E785 Hyperlipidemia, unspecified: Secondary | ICD-10-CM | POA: Diagnosis present

## 2013-07-24 DIAGNOSIS — Z4789 Encounter for other orthopedic aftercare: Secondary | ICD-10-CM | POA: Diagnosis not present

## 2013-07-24 DIAGNOSIS — G5790 Unspecified mononeuropathy of unspecified lower limb: Secondary | ICD-10-CM | POA: Diagnosis present

## 2013-07-24 DIAGNOSIS — D62 Acute posthemorrhagic anemia: Secondary | ICD-10-CM | POA: Diagnosis not present

## 2013-07-24 DIAGNOSIS — I498 Other specified cardiac arrhythmias: Secondary | ICD-10-CM | POA: Diagnosis not present

## 2013-07-24 DIAGNOSIS — E876 Hypokalemia: Secondary | ICD-10-CM | POA: Diagnosis not present

## 2013-07-24 DIAGNOSIS — N289 Disorder of kidney and ureter, unspecified: Secondary | ICD-10-CM | POA: Diagnosis present

## 2013-07-24 DIAGNOSIS — M47817 Spondylosis without myelopathy or radiculopathy, lumbosacral region: Secondary | ICD-10-CM | POA: Diagnosis not present

## 2013-07-24 DIAGNOSIS — I739 Peripheral vascular disease, unspecified: Secondary | ICD-10-CM | POA: Diagnosis present

## 2013-07-24 DIAGNOSIS — E871 Hypo-osmolality and hyponatremia: Secondary | ICD-10-CM

## 2013-07-24 DIAGNOSIS — Z9889 Other specified postprocedural states: Secondary | ICD-10-CM

## 2013-07-24 DIAGNOSIS — Z9861 Coronary angioplasty status: Secondary | ICD-10-CM | POA: Diagnosis not present

## 2013-07-24 DIAGNOSIS — N390 Urinary tract infection, site not specified: Secondary | ICD-10-CM

## 2013-07-24 DIAGNOSIS — K219 Gastro-esophageal reflux disease without esophagitis: Secondary | ICD-10-CM | POA: Diagnosis not present

## 2013-07-24 DIAGNOSIS — I779 Disorder of arteries and arterioles, unspecified: Secondary | ICD-10-CM | POA: Diagnosis present

## 2013-07-24 DIAGNOSIS — M519 Unspecified thoracic, thoracolumbar and lumbosacral intervertebral disc disorder: Secondary | ICD-10-CM | POA: Diagnosis not present

## 2013-07-24 HISTORY — DX: Unspecified convulsions: R56.9

## 2013-07-24 LAB — POCT I-STAT 4, (NA,K, GLUC, HGB,HCT)
Glucose, Bld: 114 mg/dL — ABNORMAL HIGH (ref 70–99)
Hemoglobin: 10.9 g/dL — ABNORMAL LOW (ref 12.0–15.0)

## 2013-07-24 SURGERY — POSTERIOR LUMBAR FUSION 1 LEVEL
Anesthesia: General | Site: Spine Lumbar | Wound class: Clean

## 2013-07-24 MED ORDER — ONDANSETRON HCL 4 MG/2ML IJ SOLN
INTRAMUSCULAR | Status: DC | PRN
  Administered 2013-07-24 (×2): 4 mg via INTRAVENOUS

## 2013-07-24 MED ORDER — GLYCOPYRROLATE 0.2 MG/ML IJ SOLN
INTRAMUSCULAR | Status: DC | PRN
  Administered 2013-07-24: .4 mg via INTRAVENOUS

## 2013-07-24 MED ORDER — FENTANYL CITRATE 0.05 MG/ML IJ SOLN
25.0000 ug | INTRAMUSCULAR | Status: DC | PRN
  Administered 2013-07-24 (×2): 50 ug via INTRAVENOUS

## 2013-07-24 MED ORDER — KETOROLAC TROMETHAMINE 15 MG/ML IJ SOLN
15.0000 mg | Freq: Four times a day (QID) | INTRAMUSCULAR | Status: DC
  Administered 2013-07-24 – 2013-07-25 (×3): 15 mg via INTRAVENOUS
  Filled 2013-07-24 (×7): qty 1

## 2013-07-24 MED ORDER — BISACODYL 10 MG RE SUPP
10.0000 mg | Freq: Every day | RECTAL | Status: DC | PRN
  Administered 2013-07-28 – 2013-07-29 (×2): 10 mg via RECTAL
  Filled 2013-07-24 (×2): qty 1

## 2013-07-24 MED ORDER — FENTANYL CITRATE 0.05 MG/ML IJ SOLN: 50.0000 ug | Freq: Once | INTRAMUSCULAR | Status: DC

## 2013-07-24 MED ORDER — SODIUM CHLORIDE 0.9 % IJ SOLN
3.0000 mL | Freq: Two times a day (BID) | INTRAMUSCULAR | Status: DC
  Administered 2013-07-24 – 2013-07-27 (×5): 3 mL via INTRAVENOUS

## 2013-07-24 MED ORDER — SODIUM CHLORIDE 0.9 % IV SOLN: 250.0000 mL | INTRAVENOUS | Status: DC

## 2013-07-24 MED ORDER — FLEET ENEMA 7-19 GM/118ML RE ENEM: 1.0000 | ENEMA | Freq: Once | RECTAL | Status: AC | PRN

## 2013-07-24 MED ORDER — SODIUM CHLORIDE 0.9 % IV SOLN
INTRAVENOUS | Status: DC
  Administered 2013-07-24: 12:00:00 via INTRAVENOUS

## 2013-07-24 MED ORDER — ONDANSETRON HCL 4 MG/2ML IJ SOLN
4.0000 mg | INTRAMUSCULAR | Status: DC | PRN
  Administered 2013-07-25 – 2013-07-29 (×10): 4 mg via INTRAVENOUS
  Filled 2013-07-24 (×11): qty 2

## 2013-07-24 MED ORDER — FENTANYL CITRATE 0.05 MG/ML IJ SOLN
INTRAMUSCULAR | Status: DC | PRN
  Administered 2013-07-24: 150 ug via INTRAVENOUS
  Administered 2013-07-24: 100 ug via INTRAVENOUS

## 2013-07-24 MED ORDER — CARBAMAZEPINE ER 200 MG PO TB12
200.0000 mg | ORAL_TABLET | Freq: Every day | ORAL | Status: DC
  Administered 2013-07-25 – 2013-08-01 (×8): 200 mg via ORAL
  Filled 2013-07-24 (×8): qty 1

## 2013-07-24 MED ORDER — DOCUSATE SODIUM 100 MG PO CAPS
100.0000 mg | ORAL_CAPSULE | Freq: Two times a day (BID) | ORAL | Status: DC
  Administered 2013-07-24 – 2013-08-01 (×13): 100 mg via ORAL
  Filled 2013-07-24 (×16): qty 1

## 2013-07-24 MED ORDER — MORPHINE SULFATE 2 MG/ML IJ SOLN
1.0000 mg | INTRAMUSCULAR | Status: DC | PRN
  Administered 2013-07-25 – 2013-07-26 (×4): 1 mg via INTRAVENOUS
  Administered 2013-07-28: 2 mg via INTRAVENOUS
  Filled 2013-07-24 (×5): qty 1

## 2013-07-24 MED ORDER — THROMBIN 5000 UNITS EX SOLR
OROMUCOSAL | Status: DC | PRN
  Administered 2013-07-24: 09:00:00 via TOPICAL

## 2013-07-24 MED ORDER — LIDOCAINE-EPINEPHRINE 1 %-1:100000 IJ SOLN
INTRAMUSCULAR | Status: DC | PRN
  Administered 2013-07-24: 5 mL

## 2013-07-24 MED ORDER — ALUM & MAG HYDROXIDE-SIMETH 200-200-20 MG/5ML PO SUSP
30.0000 mL | Freq: Four times a day (QID) | ORAL | Status: DC | PRN
  Administered 2013-07-24 – 2013-07-30 (×3): 30 mL via ORAL
  Filled 2013-07-24 (×3): qty 30

## 2013-07-24 MED ORDER — OXYCODONE-ACETAMINOPHEN 5-325 MG PO TABS
1.0000 | ORAL_TABLET | ORAL | Status: DC | PRN
  Administered 2013-07-24 (×2): 1 via ORAL
  Administered 2013-07-24: 2 via ORAL
  Administered 2013-07-25 – 2013-07-26 (×2): 1 via ORAL
  Administered 2013-07-27: 2 via ORAL
  Administered 2013-07-27: 1 via ORAL
  Administered 2013-07-27 – 2013-07-28 (×4): 2 via ORAL
  Filled 2013-07-24 (×4): qty 1
  Filled 2013-07-24 (×2): qty 2
  Filled 2013-07-24: qty 1
  Filled 2013-07-24 (×2): qty 2
  Filled 2013-07-24: qty 1
  Filled 2013-07-24: qty 2
  Filled 2013-07-24: qty 1
  Filled 2013-07-24: qty 2

## 2013-07-24 MED ORDER — FENTANYL CITRATE 0.05 MG/ML IJ SOLN
INTRAMUSCULAR | Status: AC
  Administered 2013-07-24: 50 ug via INTRAVENOUS
  Filled 2013-07-24: qty 2

## 2013-07-24 MED ORDER — METHOCARBAMOL 500 MG PO TABS
500.0000 mg | ORAL_TABLET | Freq: Four times a day (QID) | ORAL | Status: DC | PRN
  Administered 2013-07-28 – 2013-07-31 (×5): 500 mg via ORAL
  Filled 2013-07-24 (×5): qty 1

## 2013-07-24 MED ORDER — SODIUM CHLORIDE 0.9 % IJ SOLN: 3.0000 mL | INTRAMUSCULAR | Status: DC | PRN

## 2013-07-24 MED ORDER — METOPROLOL SUCCINATE ER 50 MG PO TB24
50.0000 mg | ORAL_TABLET | Freq: Every day | ORAL | Status: DC
  Filled 2013-07-24: qty 1

## 2013-07-24 MED ORDER — LISINOPRIL 10 MG PO TABS
10.0000 mg | ORAL_TABLET | Freq: Every day | ORAL | Status: DC
  Administered 2013-07-24: 10 mg via ORAL
  Filled 2013-07-24 (×2): qty 1

## 2013-07-24 MED ORDER — SODIUM CHLORIDE 0.9 % IR SOLN
Status: DC | PRN
  Administered 2013-07-24: 09:00:00

## 2013-07-24 MED ORDER — PHENOL 1.4 % MT LIQD: 1.0000 | OROMUCOSAL | Status: DC | PRN

## 2013-07-24 MED ORDER — NEOSTIGMINE METHYLSULFATE 1 MG/ML IJ SOLN
INTRAMUSCULAR | Status: DC | PRN
  Administered 2013-07-24: 3 mg via INTRAVENOUS

## 2013-07-24 MED ORDER — CEFAZOLIN SODIUM 1-5 GM-% IV SOLN
1.0000 g | Freq: Three times a day (TID) | INTRAVENOUS | Status: AC
  Administered 2013-07-24 (×2): 1 g via INTRAVENOUS
  Filled 2013-07-24 (×2): qty 50

## 2013-07-24 MED ORDER — MENTHOL 3 MG MT LOZG: 1.0000 | LOZENGE | OROMUCOSAL | Status: DC | PRN

## 2013-07-24 MED ORDER — VECURONIUM BROMIDE 10 MG IV SOLR
INTRAVENOUS | Status: DC | PRN
  Administered 2013-07-24: 1 mg via INTRAVENOUS
  Administered 2013-07-24 (×2): 2 mg via INTRAVENOUS

## 2013-07-24 MED ORDER — ISOSORBIDE MONONITRATE ER 60 MG PO TB24
120.0000 mg | ORAL_TABLET | Freq: Every day | ORAL | Status: DC
  Filled 2013-07-24: qty 2

## 2013-07-24 MED ORDER — 0.9 % SODIUM CHLORIDE (POUR BTL) OPTIME
TOPICAL | Status: DC | PRN
  Administered 2013-07-24: 1000 mL

## 2013-07-24 MED ORDER — LACTATED RINGERS IV SOLN
INTRAVENOUS | Status: DC | PRN
  Administered 2013-07-24 (×2): via INTRAVENOUS

## 2013-07-24 MED ORDER — SIMVASTATIN 20 MG PO TABS
20.0000 mg | ORAL_TABLET | Freq: Every day | ORAL | Status: DC
  Administered 2013-07-24: 20 mg via ORAL
  Filled 2013-07-24 (×2): qty 1

## 2013-07-24 MED ORDER — INFLUENZA VAC SPLIT QUAD 0.5 ML IM SUSP
0.5000 mL | INTRAMUSCULAR | Status: DC
  Administered 2013-07-26: 0.5 mL via INTRAMUSCULAR
  Filled 2013-07-24: qty 0.5

## 2013-07-24 MED ORDER — OXYCODONE HCL 5 MG/5ML PO SOLN: 5.0000 mg | Freq: Once | ORAL | Status: DC | PRN

## 2013-07-24 MED ORDER — DEXAMETHASONE SODIUM PHOSPHATE 4 MG/ML IJ SOLN
INTRAMUSCULAR | Status: DC | PRN
  Administered 2013-07-24: 8 mg via INTRAVENOUS

## 2013-07-24 MED ORDER — ACETAMINOPHEN 325 MG PO TABS
650.0000 mg | ORAL_TABLET | ORAL | Status: DC | PRN
  Filled 2013-07-24: qty 2

## 2013-07-24 MED ORDER — BUPIVACAINE HCL (PF) 0.5 % IJ SOLN
INTRAMUSCULAR | Status: DC | PRN
  Administered 2013-07-24: 5 mL

## 2013-07-24 MED ORDER — PROPOFOL 10 MG/ML IV BOLUS
INTRAVENOUS | Status: DC | PRN
  Administered 2013-07-24: 100 mg via INTRAVENOUS

## 2013-07-24 MED ORDER — METHOCARBAMOL 100 MG/ML IJ SOLN: 500.0000 mg | Freq: Four times a day (QID) | INTRAVENOUS | Status: DC | PRN

## 2013-07-24 MED ORDER — MIDAZOLAM HCL 2 MG/2ML IJ SOLN: 1.0000 mg | INTRAMUSCULAR | Status: DC | PRN

## 2013-07-24 MED ORDER — POLYETHYLENE GLYCOL 3350 17 G PO PACK
17.0000 g | PACK | Freq: Every day | ORAL | Status: DC | PRN
  Filled 2013-07-24: qty 1

## 2013-07-24 MED ORDER — ACETAMINOPHEN 650 MG RE SUPP
650.0000 mg | RECTAL | Status: DC | PRN
  Administered 2013-07-25: 650 mg via RECTAL
  Filled 2013-07-24: qty 1

## 2013-07-24 MED ORDER — OXYCODONE HCL 5 MG PO TABS: 5.0000 mg | ORAL_TABLET | Freq: Once | ORAL | Status: DC | PRN

## 2013-07-24 MED ORDER — THROMBIN 20000 UNITS EX SOLR
CUTANEOUS | Status: DC | PRN
  Administered 2013-07-24: 09:00:00 via TOPICAL

## 2013-07-24 MED ORDER — PANTOPRAZOLE SODIUM 40 MG PO TBEC
40.0000 mg | DELAYED_RELEASE_TABLET | Freq: Every day | ORAL | Status: DC
  Administered 2013-07-24: 40 mg via ORAL
  Filled 2013-07-24: qty 1

## 2013-07-24 MED ORDER — SENNA 8.6 MG PO TABS
1.0000 | ORAL_TABLET | Freq: Two times a day (BID) | ORAL | Status: DC
  Administered 2013-07-24 – 2013-07-30 (×9): 8.6 mg via ORAL
  Filled 2013-07-24 (×15): qty 1

## 2013-07-24 MED ORDER — ROCURONIUM BROMIDE 100 MG/10ML IV SOLN
INTRAVENOUS | Status: DC | PRN
  Administered 2013-07-24: 10 mg via INTRAVENOUS
  Administered 2013-07-24: 40 mg via INTRAVENOUS

## 2013-07-24 MED ORDER — CHLORTHALIDONE 25 MG PO TABS
25.0000 mg | ORAL_TABLET | Freq: Every day | ORAL | Status: DC
  Administered 2013-07-24: 25 mg via ORAL
  Filled 2013-07-24 (×2): qty 1

## 2013-07-24 MED ORDER — LIDOCAINE HCL (CARDIAC) 20 MG/ML IV SOLN
INTRAVENOUS | Status: DC | PRN
  Administered 2013-07-24: 50 mg via INTRAVENOUS

## 2013-07-24 MED ORDER — NITROGLYCERIN 0.4 MG SL SUBL: 0.4000 mg | SUBLINGUAL_TABLET | SUBLINGUAL | Status: DC | PRN

## 2013-07-24 MED FILL — Sodium Chloride IV Soln 0.9%: INTRAVENOUS | Qty: 1000 | Status: AC

## 2013-07-24 MED FILL — Heparin Sodium (Porcine) Inj 1000 Unit/ML: INTRAMUSCULAR | Qty: 30 | Status: AC

## 2013-07-24 SURGICAL SUPPLY — 67 items
ADH SKN CLS APL DERMABOND .7 (GAUZE/BANDAGES/DRESSINGS)
ADH SKN CLS LQ APL DERMABOND (GAUZE/BANDAGES/DRESSINGS) ×1
BAG DECANTER FOR FLEXI CONT (MISCELLANEOUS) ×2 IMPLANT
BLADE SURG ROTATE 9660 (MISCELLANEOUS) IMPLANT
BUR MATCHSTICK NEURO 3.0 LAGG (BURR) ×2 IMPLANT
CAGE 13MM (Cage) ×2 IMPLANT
CANISTER SUCT 3000ML (MISCELLANEOUS) ×2 IMPLANT
CONT SPEC 4OZ CLIKSEAL STRL BL (MISCELLANEOUS) ×4 IMPLANT
COVER BACK TABLE 24X17X13 BIG (DRAPES) IMPLANT
COVER TABLE BACK 60X90 (DRAPES) ×2 IMPLANT
DECANTER SPIKE VIAL GLASS SM (MISCELLANEOUS) ×2 IMPLANT
DERMABOND ADHESIVE PROPEN (GAUZE/BANDAGES/DRESSINGS) ×1
DERMABOND ADVANCED (GAUZE/BANDAGES/DRESSINGS)
DERMABOND ADVANCED .7 DNX12 (GAUZE/BANDAGES/DRESSINGS) ×1 IMPLANT
DERMABOND ADVANCED .7 DNX6 (GAUZE/BANDAGES/DRESSINGS) IMPLANT
DRAPE C-ARM 42X72 X-RAY (DRAPES) ×4 IMPLANT
DRAPE LAPAROTOMY 100X72X124 (DRAPES) ×2 IMPLANT
DRAPE POUCH INSTRU U-SHP 10X18 (DRAPES) ×2 IMPLANT
DRAPE PROXIMA HALF (DRAPES) ×1 IMPLANT
DRSG OPSITE POSTOP 4X6 (GAUZE/BANDAGES/DRESSINGS) ×1 IMPLANT
DURAPREP 26ML APPLICATOR (WOUND CARE) ×2 IMPLANT
ELECT REM PT RETURN 9FT ADLT (ELECTROSURGICAL) ×2
ELECTRODE REM PT RTRN 9FT ADLT (ELECTROSURGICAL) ×1 IMPLANT
GAUZE SPONGE 4X4 16PLY XRAY LF (GAUZE/BANDAGES/DRESSINGS) IMPLANT
GLOVE BIO SURGEON STRL SZ8 (GLOVE) ×1 IMPLANT
GLOVE BIOGEL PI IND STRL 7.0 (GLOVE) IMPLANT
GLOVE BIOGEL PI IND STRL 7.5 (GLOVE) IMPLANT
GLOVE BIOGEL PI IND STRL 8.5 (GLOVE) ×2 IMPLANT
GLOVE BIOGEL PI INDICATOR 7.0 (GLOVE) ×1
GLOVE BIOGEL PI INDICATOR 7.5 (GLOVE) ×2
GLOVE BIOGEL PI INDICATOR 8.5 (GLOVE) ×3
GLOVE ECLIPSE 8.5 STRL (GLOVE) ×4 IMPLANT
GLOVE EXAM NITRILE LRG STRL (GLOVE) IMPLANT
GLOVE EXAM NITRILE MD LF STRL (GLOVE) IMPLANT
GLOVE EXAM NITRILE XL STR (GLOVE) IMPLANT
GLOVE EXAM NITRILE XS STR PU (GLOVE) IMPLANT
GLOVE SURG SS PI 7.0 STRL IVOR (GLOVE) ×3 IMPLANT
GOWN BRE IMP SLV AUR LG STRL (GOWN DISPOSABLE) ×1 IMPLANT
GOWN BRE IMP SLV AUR XL STRL (GOWN DISPOSABLE) ×2 IMPLANT
GOWN STRL REIN 2XL LVL4 (GOWN DISPOSABLE) ×4 IMPLANT
HEMOSTAT POWDER KIT SURGIFOAM (HEMOSTASIS) IMPLANT
HEMOSTAT POWDER SURGIFOAM 1G (HEMOSTASIS) ×1 IMPLANT
KIT BASIN OR (CUSTOM PROCEDURE TRAY) ×2 IMPLANT
KIT ROOM TURNOVER OR (KITS) ×2 IMPLANT
NEEDLE HYPO 22GX1.5 SAFETY (NEEDLE) ×2 IMPLANT
NS IRRIG 1000ML POUR BTL (IV SOLUTION) ×2 IMPLANT
PACK FOAM VITOSS 10CC (Orthopedic Implant) ×1 IMPLANT
PACK LAMINECTOMY NEURO (CUSTOM PROCEDURE TRAY) ×2 IMPLANT
PAD ARMBOARD 7.5X6 YLW CONV (MISCELLANEOUS) ×9 IMPLANT
PATTIES SURGICAL .5 X1 (DISPOSABLE) ×2 IMPLANT
ROD TI 5.5MM 6CM (Rod) ×2 IMPLANT
SCREW 40MM (Screw) ×2 IMPLANT
SCREW SET SPINAL STD HEXALOBE (Screw) ×6 IMPLANT
SPONGE GAUZE 4X4 12PLY (GAUZE/BANDAGES/DRESSINGS) ×1 IMPLANT
SPONGE LAP 4X18 X RAY DECT (DISPOSABLE) IMPLANT
SPONGE SURGIFOAM ABS GEL 100 (HEMOSTASIS) ×2 IMPLANT
SUT VIC AB 1 CT1 18XBRD ANBCTR (SUTURE) ×1 IMPLANT
SUT VIC AB 1 CT1 8-18 (SUTURE) ×4
SUT VIC AB 2-0 CP2 18 (SUTURE) ×2 IMPLANT
SUT VIC AB 3-0 SH 8-18 (SUTURE) ×2 IMPLANT
SYR 20ML ECCENTRIC (SYRINGE) ×2 IMPLANT
SYR 3ML LL SCALE MARK (SYRINGE) ×8 IMPLANT
TOWEL OR 17X24 6PK STRL BLUE (TOWEL DISPOSABLE) ×2 IMPLANT
TOWEL OR 17X26 10 PK STRL BLUE (TOWEL DISPOSABLE) ×2 IMPLANT
TRAP SPECIMEN MUCOUS 40CC (MISCELLANEOUS) ×2 IMPLANT
TRAY FOLEY CATH 14FRSI W/METER (CATHETERS) ×2 IMPLANT
WATER STERILE IRR 1000ML POUR (IV SOLUTION) ×2 IMPLANT

## 2013-07-24 NOTE — Transfer of Care (Signed)
Immediate Anesthesia Transfer of Care Note  Patient: Carrie Gillespie  Procedure(s) Performed: Procedure(s): LUMBAR THREE-FOUR POSTERIOR LUMBAR INTERBODY FUSION (N/A)  Patient Location: PACU  Anesthesia Type:General  Level of Consciousness: awake, alert  and oriented  Airway & Oxygen Therapy: Patient Spontanous Breathing and Patient connected to nasal cannula oxygen  Post-op Assessment: Report given to PACU RN and Post -op Vital signs reviewed and stable  Post vital signs: Reviewed and stable  Complications: No apparent anesthesia complications

## 2013-07-24 NOTE — Preoperative (Signed)
Beta Blockers   Reason not to administer Beta Blockers:Not Applicable 

## 2013-07-24 NOTE — H&P (Signed)
Carrie Gillespie is an 79 y.o. female.   Chief Complaint: difficulty walking and back pain HPI: 79 y.o with degenerative spondylolisthesis and severe stenosis who has progressive difficulty walking. Has been treated conservatively. Now failing that has opted for surgery to decompress her spine.  Past Medical History  Diagnosis Date  . Peripheral vascular disease, unspecified     Minimal internal carotid artery disease  . Unspecified essential hypertension   . Other and unspecified hyperlipidemia   . Coronary atherosclerosis of native coronary artery     Status post stent to right coronary 2004, catheterization 2005 nonobstructive CAD, normal LV function, Cardiolite study 2008 negative for ischemia. Residual moderate ostial RCA disease. Stress testing 2012 negative for ischemia ejection fraction 78%  . Carotid artery disease     with an occluded right vertebral artery, otherwise nonobstructive carotid artery disease  . Nerve entrapment syndrome of lower extremity      status post prior back surgery and weakness in left leg  . Ejection fraction     LV function normal by nuclear,  2012  . Arthritis     Past Surgical History  Procedure Laterality Date  . Kidney surgery  2010    1/4 of right kidney removed [facility]  . Cardiac catheterization      nonobstructive coronary artery disease  . Cataract extraction    . Knee arthroplasty Left 2004  . Partial hysterectomy    . Coronary angioplasty  1998-1999    Family History  Problem Relation Age of Onset  . Hypertension Other   . Diabetes Other    Social History:  reports that she has never smoked. She has never used smokeless tobacco. She reports that she does not drink alcohol or use illicit drugs.  Allergies: No Known Allergies  Medications Prior to Admission  Medication Sig Dispense Refill  . aspirin EC 81 MG tablet Take 81 mg by mouth daily.        . carbamazepine (TEGRETOL XR) 200 MG 12 hr tablet Take 200 mg by mouth daily.         . chlorthalidone (HYGROTON) 25 MG tablet Take 1 tablet (25 mg total) by mouth daily.  30 tablet  6  . Cholecalciferol (VITAMIN D PO) Take 1 tablet by mouth daily.      . IRON PO Take 1 tablet by mouth daily.      . isosorbide mononitrate (IMDUR) 120 MG 24 hr tablet Take 120 mg by mouth daily.      Marland Kitchen lisinopril (PRINIVIL,ZESTRIL) 10 MG tablet Take 10 mg by mouth daily.       . metoprolol succinate (TOPROL-XL) 50 MG 24 hr tablet Take 50 mg by mouth daily. Take with or immediately following a meal.      . Multiple Vitamins-Minerals (MULTIVITAL) tablet Take 1 tablet by mouth daily.        . Multiple Vitamins-Minerals (PRESERVISION AREDS PO) Take 1 tablet by mouth daily.      . Naproxen Sodium (ALEVE) 220 MG CAPS Take 220 mg by mouth daily.      . nitroGLYCERIN (NITROSTAT) 0.4 MG SL tablet Place 0.4 mg under the tongue every 5 (five) minutes as needed for chest pain.       Marland Kitchen omeprazole (PRILOSEC) 20 MG capsule Take 20 mg by mouth daily.        . pravastatin (PRAVACHOL) 40 MG tablet Take 40 mg by mouth daily.      . vitamin B-12 (CYANOCOBALAMIN) 250 MCG tablet Take  250 mcg by mouth daily.          Results for orders placed during the hospital encounter of 07/24/13 (from the past 48 hour(s))  POCT I-STAT 4, (NA,K, GLUC, HGB,HCT)     Status: Abnormal   Collection Time    07/24/13  6:15 AM      Result Value Range   Sodium 128 (*) 135 - 145 mEq/L   Potassium 4.4  3.5 - 5.1 mEq/L   Glucose, Bld 114 (*) 70 - 99 mg/dL   HCT 32.0 (*) 36.0 - 46.0 %   Hemoglobin 10.9 (*) 12.0 - 15.0 g/dL   No results found.  Review of Systems  Constitutional: Positive for malaise/fatigue.  HENT: Negative.   Eyes: Negative.   Respiratory: Negative.   Cardiovascular: Negative.   Gastrointestinal: Negative.   Genitourinary: Negative.   Musculoskeletal: Positive for back pain.  Skin: Negative.   Neurological: Positive for tingling, focal weakness and weakness.  Endo/Heme/Allergies: Negative.    Psychiatric/Behavioral: Negative.     Blood pressure 154/62, temperature 97 F (36.1 C), temperature source Oral, resp. rate 18, SpO2 98.00%. Physical Exam  Constitutional: She is oriented to person, place, and time. She appears well-developed and well-nourished.  HENT:  Head: Normocephalic and atraumatic.  Eyes: Conjunctivae and EOM are normal. Pupils are equal, round, and reactive to light.  Neck: Normal range of motion. Neck supple.  Cardiovascular: Normal rate and regular rhythm.   Respiratory: Effort normal and breath sounds normal.  GI: Soft. Bowel sounds are normal.  Musculoskeletal: Normal range of motion.  Neurological: She is alert and oriented to person, place, and time.  Pos rhomberg. Gait slow with modest weakness in all muscle groups especially tib ant. And gastroc  Skin: Skin is warm and dry.  Psychiatric: She has a normal mood and affect. Her behavior is normal. Judgment and thought content normal.     Assessment/Plan Degenerative spondylolisthesis with severe stenosis for decompression and stabilization,.  Ulysees Robarts J 07/24/2013, 6:41 AM

## 2013-07-24 NOTE — Progress Notes (Signed)
Subjective: Patient reports Feels reasonably well  Objective: Vital signs in last 24 hours: Temp:  [97 F (36.1 C)-98 F (36.7 C)] 98 F (36.7 C) (10/28 1600) Pulse Rate:  [65-91] 82 (10/28 1600) Resp:  [11-23] 20 (10/28 1600) BP: (152-189)/(49-118) 155/50 mmHg (10/28 1600) SpO2:  [93 %-100 %] 95 % (10/28 1600) Weight:  [81.3 kg (179 lb 3.7 oz)] 81.3 kg (179 lb 3.7 oz) (10/28 1326)  Intake/Output from previous day:   Intake/Output this shift: Total I/O In: 2050 [I.V.:2000; IV Piggyback:50] Out: 1000 [Urine:1000]  dressing dry and incision clean and dry her function good in lower extremities  Lab Results:  Recent Labs  07/24/13 0615  HGB 10.9*  HCT 32.0*   BMET  Recent Labs  07/24/13 0615  NA 128*  K 4.4  GLUCOSE 114*    Studies/Results: Dg Lumbar Spine 2-3 Views  07/24/2013   CLINICAL DATA:  Lumbar spine surgery.  EXAM: LUMBAR SPINE - 2-3 VIEW; DG C-ARM 1-60 MIN  COMPARISON:  06/18/2013  FINDINGS: Three intraoperative views were obtained. Patient has new bilateral pedicle screws at L3. Patient now has bilateral pedicle screws with rod fixation at L3, L4 and L5. Interbody devices at L3-L4 and L4-L5.  IMPRESSION: New posterior lumbar interbody fusion at L3-L4.   Electronically Signed   By: Markus Daft M.D.   On: 07/24/2013 13:20   Dg C-arm 1-60 Min  07/24/2013   CLINICAL DATA:  Lumbar spine surgery.  EXAM: LUMBAR SPINE - 2-3 VIEW; DG C-ARM 1-60 MIN  COMPARISON:  06/18/2013  FINDINGS: Three intraoperative views were obtained. Patient has new bilateral pedicle screws at L3. Patient now has bilateral pedicle screws with rod fixation at L3, L4 and L5. Interbody devices at L3-L4 and L4-L5.  IMPRESSION: New posterior lumbar interbody fusion at L3-L4.   Electronically Signed   By: Markus Daft M.D.   On: 07/24/2013 13:20    Assessment/Plan: Stable postop  LOS: 0 days  Mobilize in am   Carrie Gillespie J 07/24/2013, 4:34 PM

## 2013-07-24 NOTE — Op Note (Signed)
Date of surgery: 07/24/2013 Preoperative diagnosis: Lumbar spondylosis and stenosis L3-L4 status post arthrodesis L4-L5 Postoperative diagnosis: Lumbar spondylosis and stenosis L3-L4, status post arthrodesis L4-L5, neurogenic claudication Procedure: Laminectomy L3-L4 decompression of L3-L4 nerve roots with more work than require for simple posterior lumbar interbody fusion, posterior lumbar interbody fusion using peek spacers local autograft and allograft L3-L4, segmental fixation L3-L5 with pedicle screws. Posterior lateral arthrodesis L3-L4 Surgeon: Kristeen Miss Assistant: Terrance Mass Anesthesia: General endotracheal Indications: The patient is a 79 year old lady who about 6 years ago underwent surgical decompression and arthrodesis at L4-L5. She has developed a junctional syndrome with severe stenosis at L3-L4. She's been advised regarding surgery having failed multiple efforts at conservative management. She desires to proceed with surgical decompression and stabilization.  Procedure: The patient was brought to the operating room supine on a stretcher. After the smooth induction of general endotracheal anesthesia, she was turned prone. The back was prepped with alcohol and DuraPrep. He was draped sterilely. A midline incision was created through the old scar and this was carried down to the lumbar dorsal fascia. A self-retaining retractor was placed in the wound, the area of the previous implants was uncovered. The screw caps were removed. The rods were removed. The screws at L4 were removed. Then by carefully cleaning the laminar arch of L3-1 complete laminectomy and facetectomy removing the inferior margin of the lamina of L3 out to and including the facet joint on either side was performed. The bone was harvested for later use as graft. This procedure was done bilaterally. The common dural tube was then explored. The path of the L3 nerve root was noted be severely stenotic. This was cleared with a 2  and 3 mm Kerrison punch. The path of the L4 nerve root was similarly decompressed. The procedure was then repeated on the opposite side where again a severe stenosis from severely thickened and redundant ligamentous material was encountered. The disc space was then isolated. Epidural veins in this area were cauterized and divided. A discectomy was then performed.  A complete discectomy was performed using a combination of curettes and rongeurs and disc shavers to remove all the endplate material and the disc material which was moderately degenerated. Spacers were then placed to size the interspace. 12 mm spacers fit but somewhat loosely. The endplates were then rongeured and a box cutter was used to shape the endplates. Is felt that a 13 mm interbody spacer would provide adequate realignment and establishment of normal height in the disc space. The autograft that had been harvested was then packed into the interspace and 3 separate 3 cc syringes for a total of 9 cc. 213 mm cages were placed into the interspace and countersunk.  Attention was then placed to placing screws. 2  5.5 x 45 mm screws were placed in L3. The previous screws 5.5 by 40 mm were placed in L4. 60 mm precontoured rods were then used to connect the screw heads together. The construct was fixed in a neutral position. The lateral gutters which had been previously decorticated and packed away were packed with the remainder of autograft and allograft. Final radiographs were obtained in the AP and lateral projection demonstrating a normal contour to the lumbar lordosis. Good placement of the hardware was also noted. The nerve roots and the common dural tube were checked for decompression. When this was verified the retractors were removed, and the lumbar dorsal fascia was closed with #1 Vicryl in interrupted fashion. 2-0 Vicryl was used in the  subcutaneous tissues. 3-0 Vicryl is used to close subcuticular skin. Dermabond was placed on the skin.  Blood loss was estimated at less than 200 cc. Patient was returned to recovery room in stable condition.

## 2013-07-24 NOTE — Progress Notes (Signed)
Orthopedic Tech Progress Note Patient Details:  Carrie Gillespie 27-Jun-1920 [id number]  Patient ID: [name], female   DOB: September 24, 1920, 79 y.o.   MRN: [id number]   Irish Elders 07/24/2013, 2:33 PM Called [employer] for lumbar brace.

## 2013-07-24 NOTE — Anesthesia Postprocedure Evaluation (Signed)
  Anesthesia Post-op Note  Patient: Carrie Gillespie  Procedure(s) Performed: Procedure(s): LUMBAR THREE-FOUR POSTERIOR LUMBAR INTERBODY FUSION (N/A)  Patient Location: PACU  Anesthesia Type:General  Level of Consciousness: awake  Airway and Oxygen Therapy: Patient Spontanous Breathing  Post-op Pain: mild  Post-op Assessment: Post-op Vital signs reviewed, Patient's Cardiovascular Status Stable, Respiratory Function Stable, Patent Airway, No signs of Nausea or vomiting and Pain level controlled  Post-op Vital Signs: Reviewed and stable  Complications: No apparent anesthesia complications

## 2013-07-24 NOTE — Anesthesia Preprocedure Evaluation (Signed)
Anesthesia Evaluation  Patient identified by MRN, date of birth, ID band Patient awake    Reviewed: Allergy & Precautions, H&P , NPO status , Patient's Chart, lab work & pertinent test results  Airway Mallampati: II TM Distance: >3 FB Neck ROM: Full    Dental   Pulmonary  breath sounds clear to auscultation        Cardiovascular hypertension, + CAD and + Peripheral Vascular Disease Rhythm:Regular Rate:Normal     Neuro/Psych  Neuromuscular disease    GI/Hepatic   Endo/Other    Renal/GU Renal InsufficiencyRenal disease     Musculoskeletal   Abdominal (+) + obese,   Peds  Hematology   Anesthesia Other Findings hyponatremia  Reproductive/Obstetrics                           Anesthesia Physical Anesthesia Plan  ASA: III  Anesthesia Plan: General   Post-op Pain Management:    Induction: Intravenous  Airway Management Planned: Oral ETT  Additional Equipment:   Intra-op Plan:   Post-operative Plan: Extubation in OR  Informed Consent: I have reviewed the patients History and Physical, chart, labs and discussed the procedure including the risks, benefits and alternatives for the proposed anesthesia with the patient or authorized representative who has indicated his/her understanding and acceptance.     Plan Discussed with: CRNA and Surgeon  Anesthesia Plan Comments:         Anesthesia Quick Evaluation

## 2013-07-25 ENCOUNTER — Encounter (HOSPITAL_COMMUNITY): Payer: Self-pay | Admitting: *Deleted

## 2013-07-25 DIAGNOSIS — I2510 Atherosclerotic heart disease of native coronary artery without angina pectoris: Secondary | ICD-10-CM

## 2013-07-25 DIAGNOSIS — E871 Hypo-osmolality and hyponatremia: Secondary | ICD-10-CM

## 2013-07-25 DIAGNOSIS — R55 Syncope and collapse: Secondary | ICD-10-CM | POA: Diagnosis not present

## 2013-07-25 LAB — CBC
HCT: 28.5 % — ABNORMAL LOW (ref 36.0–46.0)
Hemoglobin: 10.1 g/dL — ABNORMAL LOW (ref 12.0–15.0)
MCH: 31.4 pg (ref 26.0–34.0)
MCHC: 35.4 g/dL (ref 30.0–36.0)
MCV: 88.5 fL (ref 78.0–100.0)
Platelets: 213 10*3/uL (ref 150–400)
RBC: 3.22 MIL/uL — ABNORMAL LOW (ref 3.87–5.11)
RDW: 13.7 % (ref 11.5–15.5)
WBC: 10.9 10*3/uL — ABNORMAL HIGH (ref 4.0–10.5)

## 2013-07-25 LAB — BASIC METABOLIC PANEL
CO2: 28 mEq/L (ref 19–32)
Chloride: 85 mEq/L — ABNORMAL LOW (ref 96–112)
Creatinine, Ser: 0.89 mg/dL (ref 0.50–1.10)
Glucose, Bld: 114 mg/dL — ABNORMAL HIGH (ref 70–99)
Sodium: 123 mEq/L — ABNORMAL LOW (ref 135–145)

## 2013-07-25 LAB — TROPONIN I
Troponin I: <0.3 ng/mL (ref <0.30)
Troponin I: <0.3 ng/mL (ref <0.30)
Troponin I: <0.3 ng/mL (ref <0.30)

## 2013-07-25 LAB — TSH: TSH: 1.745 u[IU]/mL (ref 0.350–4.500)

## 2013-07-25 MED ORDER — PANTOPRAZOLE SODIUM 40 MG IV SOLR
40.0000 mg | Freq: Two times a day (BID) | INTRAVENOUS | Status: DC
  Administered 2013-07-25 – 2013-07-26 (×3): 40 mg via INTRAVENOUS
  Filled 2013-07-25 (×5): qty 40

## 2013-07-25 MED ORDER — SODIUM CHLORIDE 1 G PO TABS
2.0000 g | ORAL_TABLET | Freq: Three times a day (TID) | ORAL | Status: DC
  Administered 2013-07-25 – 2013-08-01 (×19): 2 g via ORAL
  Filled 2013-07-25 (×25): qty 2

## 2013-07-25 MED ORDER — GI COCKTAIL ~~LOC~~
30.0000 mL | Freq: Once | ORAL | Status: AC
  Administered 2013-07-25: 30 mL via ORAL
  Filled 2013-07-25: qty 30

## 2013-07-25 NOTE — Progress Notes (Signed)
eLink Physician-Brief Progress Note Patient Name: Carrie Gillespie DOB: 04-16-1920 MRN: [id number]  Date of Service  07/25/2013   HPI/Events of Note   Chest pain, not new Earlier EKG - non-specific ST changes Earlier Troponin < 0.30   eICU Interventions   Repeat 12 lead ECG Follow repeat Troponin    Intervention Category Intermediate Interventions: OtherDoree Fudge 07/25/2013, 4:29 PM

## 2013-07-25 NOTE — Clinical Social Work Note (Signed)
Clinical Social Worker received standing order for possible ST-SNF placement.  Chart reviewed.  PT/OT evaluations pending due to patient medical issues this morning.  CSW to follow up with patient and family following PT/OT recommendations for discharge planning.   Barbette Or, [place]

## 2013-07-25 NOTE — Progress Notes (Signed)
PT Cancellation Note  Patient Details Name: Carrie Gillespie MRN: [id number] DOB: 04/18/1920   Cancelled Treatment:    Reason Eval/Treat Not Completed: Medical issues which prohibited therapy.  Pt with episode of unresponsiveness this am.  Will hold PT at this time and f/u another time.     Catarina Hartshorn, [employer] 07/25/2013, 11:43 AM

## 2013-07-25 NOTE — Progress Notes (Addendum)
EKG & labs obtained. MD aware of EKG result. SBP 110s-120s, DBP 30s-40s. Pt lying flat currently and BP 133/34. MD aware.  Will continue to monitor.   Carrie Dodrill  MD aware, troponin <0.30

## 2013-07-25 NOTE — Progress Notes (Signed)
Pt complaining of heartburn/acid reflux.  Pt vomited 336mL of black, coffee ground looking emesis.   MD was paged and Dr. Vertell Limber returned call.   Additional dose of protonix 40mg  IV was ordered along with a GI cocktail. I will continue to monitor pt, Ardelle Haliburton Ventry-Smith, RN, BSN

## 2013-07-25 NOTE — Progress Notes (Signed)
Bradycardia noted on monitor, 50s, decreased to 40s. RN went immediately into room. Pt found sitting in chair, leaned to the side, not talking and breathing labored. Sats 95-99% on monitor. RNs got pt back to bed. Pt unresponsive at this time, but then came around and started to talk.  BP 170s and HR increased to 100s. Pt then stated she felt sick, was turned on her side, and vomited coffee ground emesis. Pt lethargic, opens eyes, oriented, stating she is cold. Warm blankets applied and emotional support given. VSS, HR 80s-100 and current BP 127/70. Family at bedside, updated by RN. Dr. Ellene Route returned page and was made aware. CCM consulted by Dr. Ellene Route.   Carrie Gillespie

## 2013-07-25 NOTE — Progress Notes (Signed)
Pt reported to RN that she has a h/o seizures. Pt already has Tegretol ordered. History updated in chart and Dr. Ellene Route made aware.   Latrelle Dodrill

## 2013-07-25 NOTE — Progress Notes (Signed)
OT Cancellation Note  Patient Details Name: Carrie Gillespie MRN: [id number] DOB: [birth year]   Cancelled Treatment:    Reason Eval/Treat Not Completed: Medical issues which prohibited therapy  [place], OTR/L  [id number] 07/25/2013 07/25/2013, 12:45 PM

## 2013-07-25 NOTE — Progress Notes (Addendum)
Pt reported 7/10 chest & anterior neck pain. Sats mid 90s, no change from prior. [place] 2L 02 applied. Elink MD called and aware. Repeat stat EKG ordered, morphine 1 mg IV given at 16:32. Prior to administration of morphine, pt reported a decrease in chest pain. EKG was obtained and MD made aware of results. Pt now denies CP. MD also aware of pt's increase in HR 100s-120 and also that tylenol was given for spike in temp to 101.6. Pt taught use of IS per RT. Will continue to monitor pt and encourage IS & TCDB. Family at bedside, updated, and emotional support given to pt and family. Discussed possible feelings of anxiety and television encouraged to aid in distraction and comfort.   Carrie Gillespie

## 2013-07-25 NOTE — Consult Note (Signed)
PULMONARY  / CRITICAL CARE MEDICINE  Name: Carrie Gillespie MRN: [id number] DOB: Aug 29, 1920    ADMISSION DATE:  07/24/2013 CONSULTATION DATE:  07/25/13  REFERRING MD :  Ellene Route PRIMARY SERVICE: Neurosurgery   CHIEF COMPLAINT:  Vagal, bradycardia, coffee ground emesis   BRIEF PATIENT DESCRIPTION:  79 yo female with hx seizures, PVD admitted 10/28 for elective lumbar laminectomy.  POD #1 had episode bradycardia, ?vagal episode with AMS as well as coffee ground emesis.  PCCM consulted.   SIGNIFICANT EVENTS / STUDIES:  10/28 lumbar laminectomy L3-L4 10/29 vagal, coffee ground emesis   LINES / TUBES: PIV  CULTURES: none  ANTIBIOTICS: none  HISTORY OF PRESENT ILLNESS:   79 yo female with hx seizures, PVD admitted 10/28 for elective lumbar laminectomy.  POD #1 had episode bradycardia with HR 40's, (?vagal) with AMS as well as coffee ground emesis.  Pt quickly recovered after vagal episode with return of mental status, stable BP and HR 90's.  Still c/o nausea, back pain.  Denies chest pain, headache.   PAST MEDICAL HISTORY :  Past Medical History  Diagnosis Date  . Peripheral vascular disease, unspecified     Minimal internal carotid artery disease  . Unspecified essential hypertension   . Other and unspecified hyperlipidemia   . Coronary atherosclerosis of native coronary artery     Status post stent to right coronary 2004, catheterization 2005 nonobstructive CAD, normal LV function, Cardiolite study 2008 negative for ischemia. Residual moderate ostial RCA disease. Stress testing 2012 negative for ischemia ejection fraction 78%  . Carotid artery disease     with an occluded right vertebral artery, otherwise nonobstructive carotid artery disease  . Nerve entrapment syndrome of lower extremity      status post prior back surgery and weakness in left leg  . Ejection fraction     LV function normal by nuclear,  2012  . Arthritis   . Seizures    Past Surgical History  Procedure  Laterality Date  . Kidney surgery  2010    1/4 of right kidney removed [facility]  . Cardiac catheterization      nonobstructive coronary artery disease  . Cataract extraction    . Knee arthroplasty Left 2004  . Partial hysterectomy    . Coronary angioplasty  1998-1999   Prior to Admission medications   Medication Sig Start Date End Date Taking? Authorizing Provider  aspirin EC 81 MG tablet Take 81 mg by mouth daily.     Yes Historical Provider, MD  carbamazepine (TEGRETOL XR) 200 MG 12 hr tablet Take 200 mg by mouth daily.     Yes Historical Provider, MD  chlorthalidone (HYGROTON) 25 MG tablet Take 1 tablet (25 mg total) by mouth daily. 06/25/13  Yes Carlena Bjornstad, MD  Cholecalciferol (VITAMIN D PO) Take 1 tablet by mouth daily.   Yes Historical Provider, MD  IRON PO Take 1 tablet by mouth daily.   Yes Historical Provider, MD  isosorbide mononitrate (IMDUR) 120 MG 24 hr tablet Take 120 mg by mouth daily.   Yes Historical Provider, MD  lisinopril (PRINIVIL,ZESTRIL) 10 MG tablet Take 10 mg by mouth daily.  11/06/11  Yes Ezra Sites, MD  metoprolol succinate (TOPROL-XL) 50 MG 24 hr tablet Take 50 mg by mouth daily. Take with or immediately following a meal.   Yes Historical Provider, MD  Multiple Vitamins-Minerals (MULTIVITAL) tablet Take 1 tablet by mouth daily.     Yes Historical Provider, MD  Multiple Vitamins-Minerals (PRESERVISION AREDS  PO) Take 1 tablet by mouth daily.   Yes Historical Provider, MD  Naproxen Sodium (ALEVE) 220 MG CAPS Take 220 mg by mouth daily.   Yes Historical Provider, MD  nitroGLYCERIN (NITROSTAT) 0.4 MG SL tablet Place 0.4 mg under the tongue every 5 (five) minutes as needed for chest pain.    Yes Historical Provider, MD  omeprazole (PRILOSEC) 20 MG capsule Take 20 mg by mouth daily.     Yes Historical Provider, MD  pravastatin (PRAVACHOL) 40 MG tablet Take 40 mg by mouth daily.   Yes Historical Provider, MD  vitamin B-12 (CYANOCOBALAMIN) 250 MCG tablet Take 250 mcg  by mouth daily.     Yes Historical Provider, MD   No Known Allergies  FAMILY HISTORY:  Family History  Problem Relation Age of Onset  . Hypertension Other   . Diabetes Other    SOCIAL HISTORY:  reports that she has never smoked. She has never used smokeless tobacco. She reports that she does not drink alcohol or use illicit drugs.  REVIEW OF SYSTEMS:  Difficult to obtain, pt vomiting.  Reviewed as per HPI.    VITAL SIGNS: Temp:  [97.2 F (36.2 C)-99.1 F (37.3 C)] 99.1 F (37.3 C) (10/29 0800) Pulse Rate:  [65-98] 86 (10/29 0800) Resp:  [11-29] 20 (10/29 0800) BP: (108-189)/(32-118) 123/42 mmHg (10/29 0800) SpO2:  [93 %-100 %] 98 % (10/29 0800) Weight:  [179 lb 3.7 oz (81.3 kg)] 179 lb 3.7 oz (81.3 kg) (10/28 1326) 2 liters [place]  INTAKE / OUTPUT: Intake/Output     10/28 0701 - 10/29 0700 10/29 0701 - 10/30 0700   P.O. 750    I.V. (mL/kg) 2753 (33.9) 100 (1.2)   IV Piggyback 50    Total Intake(mL/kg) 3553 (43.7) 100 (1.2)   Urine (mL/kg/hr) 1640 (0.8) 100 (0.4)   Emesis/NG output 350 (0.2)    Total Output 1990 100   Net +1563 0          PHYSICAL EXAMINATION: General:  Chronically ill appearing elderly female Neuro:  Awake, slow to respond but appropriate, oriented x3, MAE HEENT:  Mm dry, pale, no JVD Cardiovascular:  s1s2 rrr, NSR 90's Lungs:  resps even non labored on RA, diminished bases Abdomen:  Round, soft, hypoactive bs, mildly tender Musculoskeletal:  Pale, warm and dry, scant BLE edema   LABS:  CBC Recent Labs     07/24/13  0615  07/25/13  0356  WBC   --   10.9*  HGB  10.9*  10.1*  HCT  32.0*  28.5*  PLT   --   213   Coag's No results found for this basename: APTT, INR,  in the last 72 hours  BMET Recent Labs     07/24/13  0615  07/25/13  0356  NA  128*  123*  K  4.4  4.1  CL   --   85*  CO2   --   28  BUN   --   23  CREATININE   --   0.89  GLUCOSE  114*  114*   Electrolytes Recent Labs     07/25/13  0356  CALCIUM  8.9   Sepsis  Markers No results found for this basename: LACTICACIDVEN, PROCALCITON, O2SATVEN,  in the last 72 hours  ABG No results found for this basename: PHART, PCO2ART, PO2ART,  in the last 72 hours  Liver Enzymes No results found for this basename: AST, ALT, ALKPHOS, BILITOT, ALBUMIN,  in the last 72  hours  Cardiac Enzymes No results found for this basename: TROPONINI, PROBNP,  in the last 72 hours  Glucose No results found for this basename: GLUCAP,  in the last 72 hours  Imaging Dg Lumbar Spine 2-3 Views  07/24/2013   CLINICAL DATA:  Lumbar spine surgery.  EXAM: LUMBAR SPINE - 2-3 VIEW; DG C-ARM 1-60 MIN  COMPARISON:  06/18/2013  FINDINGS: Three intraoperative views were obtained. Patient has new bilateral pedicle screws at L3. Patient now has bilateral pedicle screws with rod fixation at L3, L4 and L5. Interbody devices at L3-L4 and L4-L5.  IMPRESSION: New posterior lumbar interbody fusion at L3-L4.   Electronically Signed   By: Markus Daft M.D.   On: 07/24/2013 13:20   Dg C-arm 1-60 Min  07/24/2013   CLINICAL DATA:  Lumbar spine surgery.  EXAM: LUMBAR SPINE - 2-3 VIEW; DG C-ARM 1-60 MIN  COMPARISON:  06/18/2013  FINDINGS: Three intraoperative views were obtained. Patient has new bilateral pedicle screws at L3. Patient now has bilateral pedicle screws with rod fixation at L3, L4 and L5. Interbody devices at L3-L4 and L4-L5.  IMPRESSION: New posterior lumbar interbody fusion at L3-L4.   Electronically Signed   By: Markus Daft M.D.   On: 07/24/2013 13:20     ASSESSMENT / PLAN:  CARDIOVASCULAR Bradycardia Vagal episode  Hx HTN  Carotid artery disease  >> followed by Dr. Ron Parker with cardiology as outpt P:  12 lead ekg  Cardiac panel  Check chem  Hold metoprolol, lisinopril, isosorbide, zocor for now  NEUROLOGIC S/p Lumbar laminectomy - L3-L4 Hx seizure P:   Per neurosurgery  Continue tegretol   PULMONARY Atelectasis P:   O2 as needed  F/u CXR Bronchial  hygiene  RENAL Hyponatremia - unknown etiology  P:   Contine gentle NS  F/u chem, TSH, cortisol  GASTROINTESTINAL Vomiting - coffee ground emesis  P:   F/u hgb  Consider GI input if recurs and Hb drops Protonix IV  HEMATOLOGIC Anemia  P:  SCD's for DVT proph  F/u cbc   INFECTIOUS No active issue  P:   Trend wbc, fever curve off abx   ENDOCRINE No active issue  P:   Monitor glucose on Sofie Hartigan, NP 07/25/2013  9:46 AM Pager: [phone] or [phone]  Reviewed above, examined pt, and agree with assessment/plan.  She has syncopal event while sitting in chair.  She has nausea with episode of vomiting >> reported to be coffee ground in appearance.  Has mild anemia.  Hemodynamics otherwise stable.  She has hx of seizure, but this AM event did not appear to be seizure activity.  Has hx of hyponatremia >> not sure what baseline sodium level is.  Will hold lopressor, imdur, and zocor for now.  F/u Hb, prn zofran, and monitor for bleeding.  Continue protonix IV >> may need GI evaluation.  Check ECG, cardiac enzymes, and then decide if cardiology evaluation needed.  Continue NS IV fluids.  Check TSH, cortisol.  Updated pts family at bedside.  D/w Dr. Ellene Route.  Chesley Mires, MD [facility] Pulmonary/Critical Care 07/25/2013, 10:55 AM Pager:  [phone] After 3pm call: [phone]

## 2013-07-25 NOTE — Progress Notes (Signed)
Subjective: Patient reports restless night with nausea and vomiting of coffee-ground material area  Objective: Vital signs in last 24 hours: Temp:  [97.2 F (36.2 C)-98.5 F (36.9 C)] 97.6 F (36.4 C) (10/29 0400) Pulse Rate:  [65-98] 86 (10/29 0800) Resp:  [11-29] 20 (10/29 0800) BP: (108-189)/(32-118) 123/42 mmHg (10/29 0800) SpO2:  [93 %-100 %] 98 % (10/29 0800) Weight:  [81.3 kg (179 lb 3.7 oz)] 81.3 kg (179 lb 3.7 oz) (10/28 1326)  Intake/Output from previous day: 10/28 0701 - 10/29 0700 In: 3553 [P.O.:750; I.V.:2753; IV Piggyback:50] Out: 1990 [id number]; Emesis/NG output:350] Intake/Output this shift:    Incisions clean and dry motor function is intact in lower extremities positive bowel sounds noted.  Lab Results:  Recent Labs  07/24/13 0615 07/25/13 0356  WBC  --  10.9*  HGB 10.9* 10.1*  HCT 32.0* 28.5*  PLT  --  213   BMET  Recent Labs  07/24/13 0615 07/25/13 0356  NA 128* 123*  K 4.4 4.1  CL  --  85*  CO2  --  28  GLUCOSE 114* 114*  BUN  --  23  CREATININE  --  0.89  CALCIUM  --  8.9    Studies/Results: Dg Lumbar Spine 2-3 Views  07/24/2013   CLINICAL DATA:  Lumbar spine surgery.  EXAM: LUMBAR SPINE - 2-3 VIEW; DG C-ARM 1-60 MIN  COMPARISON:  06/18/2013  FINDINGS: Three intraoperative views were obtained. Patient has new bilateral pedicle screws at L3. Patient now has bilateral pedicle screws with rod fixation at L3, L4 and L5. Interbody devices at L3-L4 and L4-L5.  IMPRESSION: New posterior lumbar interbody fusion at L3-L4.   Electronically Signed   By: Markus Daft M.D.   On: 07/24/2013 13:20   Dg C-arm 1-60 Min  07/24/2013   CLINICAL DATA:  Lumbar spine surgery.  EXAM: LUMBAR SPINE - 2-3 VIEW; DG C-ARM 1-60 MIN  COMPARISON:  06/18/2013  FINDINGS: Three intraoperative views were obtained. Patient has new bilateral pedicle screws at L3. Patient now has bilateral pedicle screws with rod fixation at L3, L4 and L5. Interbody devices at L3-L4 and  L4-L5.  IMPRESSION: New posterior lumbar interbody fusion at L3-L4.   Electronically Signed   By: Markus Daft M.D.   On: 07/24/2013 13:20    Assessment/Plan: Hyponatremia noted, acute blood loss anemia noted. Hemoglobin 10.1 currently. Continue IV hydration monitor sodiums.  LOS: 1 day  In ICU for an additional day.   Keylee Shrestha J 07/25/2013, 8:43 AM

## 2013-07-25 NOTE — Progress Notes (Addendum)
Update: Pt's daughters approached RN and stated that pt has had episodes in the past where she felt lightheaded, weak, and has been known to pass out before, sometimes when she hasn't eaten.   Latrelle Dodrill

## 2013-07-26 ENCOUNTER — Inpatient Hospital Stay (HOSPITAL_COMMUNITY): Payer: Medicare Other

## 2013-07-26 LAB — CBC
MCH: 31.8 pg (ref 26.0–34.0)
MCHC: 35.7 g/dL (ref 30.0–36.0)
Platelets: 174 10*3/uL (ref 150–400)
RBC: 3.02 MIL/uL — ABNORMAL LOW (ref 3.87–5.11)
RDW: 13.6 % (ref 11.5–15.5)
WBC: 11.2 10*3/uL — ABNORMAL HIGH (ref 4.0–10.5)

## 2013-07-26 LAB — BASIC METABOLIC PANEL
BUN: 12 mg/dL (ref 6–23)
Chloride: 90 mEq/L — ABNORMAL LOW (ref 96–112)
Creatinine, Ser: 0.79 mg/dL (ref 0.50–1.10)
GFR calc non Af Amer: 71 mL/min — ABNORMAL LOW (ref >90)
Glucose, Bld: 123 mg/dL — ABNORMAL HIGH (ref 70–99)
Potassium: 3.9 mEq/L (ref 3.5–5.1)

## 2013-07-26 MED ORDER — ASPIRIN EC 81 MG PO TBEC
81.0000 mg | DELAYED_RELEASE_TABLET | Freq: Every day | ORAL | Status: DC
  Administered 2013-07-26 – 2013-08-01 (×6): 81 mg via ORAL
  Filled 2013-07-26 (×7): qty 1

## 2013-07-26 MED ORDER — SIMVASTATIN 20 MG PO TABS
20.0000 mg | ORAL_TABLET | Freq: Every day | ORAL | Status: DC
  Administered 2013-07-26 – 2013-07-31 (×6): 20 mg via ORAL
  Filled 2013-07-26 (×7): qty 1

## 2013-07-26 MED ORDER — LISINOPRIL 10 MG PO TABS
10.0000 mg | ORAL_TABLET | Freq: Every day | ORAL | Status: DC
  Administered 2013-07-26 – 2013-08-01 (×7): 10 mg via ORAL
  Filled 2013-07-26 (×7): qty 1

## 2013-07-26 MED ORDER — INFLUENZA VAC SPLIT QUAD 0.5 ML IM SUSP
0.5000 mL | Freq: Once | INTRAMUSCULAR | Status: AC
  Filled 2013-07-26: qty 0.5

## 2013-07-26 MED ORDER — PANTOPRAZOLE SODIUM 40 MG PO TBEC
40.0000 mg | DELAYED_RELEASE_TABLET | Freq: Every day | ORAL | Status: DC
  Administered 2013-07-27 – 2013-07-28 (×2): 40 mg via ORAL
  Filled 2013-07-26 (×2): qty 1

## 2013-07-26 NOTE — Progress Notes (Signed)
Occupational Therapy Evaluation Patient Details Name: Carrie Gillespie MRN: [id number] DOB: 11-13-1919 Today's Date: 07/26/2013 Time: [id number] OT Time Calculation (min): 32 min  OT Assessment / Plan / Recommendation History of present illness 79 yo female with hx seizures, PVD admitted 10/28 for elective lumbar laminectomy.  POD #1 had episode bradycardia with HR 40's, (?vagal) with AMS as well as coffee ground emesis.  Pt quickly recovered after vagal episode with return of mental status, stable BP and HR 90's.  Still c/o nausea, back pain.  Denies chest pain, headache.    Clinical Impression   PTA, pt lived independently at home alone. Recently lost husband of 73 years one month ago. Pt making slow progress and only able to ambulate a few feet before requesting to sit. Did not recall back precautions from earlier PT session. Feel SNF for rehab is most appropriate D/C plan at this time. Pt agreeable to SNF for short term rehab. Pt will benefit from skilled OT services to facilitate D/C to next venue due to below deficits. Will educate on AE for ADL next session.    OT Assessment  Patient needs continued OT Services    Follow Up Recommendations  SNF    Barriers to Discharge   unsure that caregivers can provide level of care needed  Equipment Recommendations  3 in 1 bedside comode;Tub/shower bench    Recommendations for Other Services    Frequency  Min 2X/week    Precautions / Restrictions Precautions Precautions: Back Precaution Booklet Issued: Yes (comment) Required Braces or Orthoses: Spinal Brace Spinal Brace: Applied in sitting position Restrictions Weight Bearing Restrictions: No   Pertinent Vitals/Pain Pain 5/10. Educated to use ide on back when in bed.    ADL  Grooming: Set up;Supervision/safety Where Assessed - Grooming: Supported sitting Upper Body Bathing: Supervision/safety;Set up Where Assessed - Upper Body Bathing: Supported sitting Lower Body Bathing: Maximal  assistance Where Assessed - Lower Body Bathing: Supported sit to stand Upper Body Dressing: Set up;Supervision/safety Where Assessed - Upper Body Dressing: Supported sitting Lower Body Dressing: Maximal assistance Where Assessed - Lower Body Dressing: Supported sit to Lobbyist: Moderate assistance Toilet Transfer Method: Sit to stand Toileting - Clothing Manipulation and Hygiene: Maximal assistance Where Assessed - Toileting Clothing Manipulation and Hygiene: Sit to stand from 3-in-1 or toilet Equipment Used: Back brace;Gait belt;Rolling walker Transfers/Ambulation Related to ADLs: mod A ADL Comments: no knowledge of back precautions/AE    OT Diagnosis: Generalized weakness;Acute pain  OT Problem List: Decreased strength;Decreased range of motion;Decreased activity tolerance;Decreased safety awareness;Decreased knowledge of precautions;Decreased knowledge of use of DME or AE;Pain OT Treatment Interventions: Self-care/ADL training;Therapeutic exercise;DME and/or AE instruction;Manual therapy;Energy conservation;Therapeutic activities;Patient/family education;Balance training   OT Goals(Current goals can be found in the care plan section) Acute Rehab OT Goals Patient Stated Goal: To go home OT Goal Formulation: With patient Time For Goal Achievement: 08/09/13 Potential to Achieve Goals: Good  Visit Information  Last OT Received On: 07/26/13 Assistance Needed: +1 History of Present Illness: 79 yo female with hx seizures, PVD admitted 10/28 for elective lumbar laminectomy.  POD #1 had episode bradycardia with HR 40's, (?vagal) with AMS as well as coffee ground emesis.  Pt quickly recovered after vagal episode with return of mental status, stable BP and HR 90's.  Still c/o nausea, back pain.  Denies chest pain, headache.        Prior Functioning     Home Living Family/patient expects to be discharged to:: Private residence Living Arrangements: Alone  Available Help at  Discharge: Family;Available 24 hours/day Type of Home: House Home Access: Stairs to enter [employer] of Steps: 1 Entrance Stairs-Rails: None Home Layout: Two level;Able to live on main level with bedroom/bathroom (laundry room in basement) Home Equipment: Walker - 4 wheels;Cane - single point Prior Function Level of Independence: Independent with assistive device(s) Comments: ambulates with rollater Communication Communication: No difficulties Dominant Hand: Right         Vision/Perception     Cognition  Cognition Arousal/Alertness: Awake/alert Behavior During Therapy: WFL for tasks assessed/performed Overall Cognitive Status: Within Functional Limits for tasks assessed    Extremity/Trunk Assessment Upper Extremity Assessment Upper Extremity Assessment: Generalized weakness Lower Extremity Assessment Lower Extremity Assessment: Generalized weakness RLE: Unable to fully assess due to pain RLE Sensation: history of peripheral neuropathy LLE: Unable to fully assess due to pain LLE Sensation: history of peripheral neuropathy Cervical / Trunk Assessment Cervical / Trunk Assessment: Kyphotic     Mobility Bed Mobility Bed Mobility: Rolling Left;Left Sidelying to Sit Rolling Left: 4: Min assist Left Sidelying to Sit: With rails;3: Mod assist Transfers Sit to Stand: 4: Min assist;From bed Stand to Sit: To chair/3-in-1;3: Mod assist Details for Transfer Assistance: Had to elevate bed height to stand. uncontrolled descent to chair     Exercise     Balance Balance Balance Assessed: Yes Static Sitting Balance Static Sitting - Balance Support: Feet supported Static Sitting - Level of Assistance: 5: Stand by assistance   End of Session OT - End of Session Equipment Utilized During Treatment: Gait belt;Rolling walker;Back brace Activity Tolerance: Patient tolerated treatment well Patient left: in chair;with call bell/phone within reach;with family/visitor  present Nurse Communication: Mobility status  GO     Laurance Heide,HILLARY 07/26/2013, 6:53 PM [facility], OTR/L  [phone] 07/26/2013

## 2013-07-26 NOTE — Progress Notes (Signed)
OT Cancellation Note  Patient Details Name: Carrie Gillespie MRN: [id number] DOB: Dec 14, 1919   Cancelled Treatment:    Reason Eval/Treat Not Completed: Other (comment) Attempted this am. Pt too fatigued after being up in chair. Will check back this am. [place], OTR/L  [id number] 07/26/2013 07/26/2013, 12:49 PM

## 2013-07-26 NOTE — Consult Note (Signed)
PULMONARY  / CRITICAL CARE MEDICINE  Name: Carrie Gillespie MRN: [id number] DOB: November 17, 1919    ADMISSION DATE:  07/24/2013 CONSULTATION DATE:  07/25/13  REFERRING MD :  Ellene Route PRIMARY SERVICE: Neurosurgery   CHIEF COMPLAINT:  Vagal, bradycardia, coffee ground emesis   BRIEF PATIENT DESCRIPTION:  79 yo female with hx seizures, PVD admitted 10/28 for elective lumbar laminectomy.  POD #1 had episode bradycardia, ?vagal episode with AMS as well as coffee ground emesis.  PCCM consulted.   SIGNIFICANT EVENTS / STUDIES:  10/28 lumbar laminectomy L3-L4 10/29 vagal, coffee ground emesis   LINES / TUBES: PIV  CULTURES: none  ANTIBIOTICS: None  SUBJECTIVE: Denies nausea, dizziness, chest pain, or dyspnea.  Does not remember what happened yesterday.  VITAL SIGNS: Temp:  [98.4 F (36.9 C)-101.6 F (38.7 C)] 99.4 F (37.4 C) (10/30 0819) Pulse Rate:  [90-115] 97 (10/30 1000) Resp:  [13-35] 13 (10/30 1000) BP: (110-163)/(34-98) 132/44 mmHg (10/30 1000) SpO2:  [93 %-100 %] 98 % (10/30 1000) Room air  INTAKE / OUTPUT: Intake/Output     10/29 0701 - 10/30 0700 10/30 0701 - 10/31 0700   P.O. 250 240   I.V. (mL/kg) 1203 (14.8) 153 (1.9)   IV Piggyback     Total Intake(mL/kg) 1453 (17.9) 393 (4.8)   Urine (mL/kg/hr) 2685 (1.4) 110 (0.4)   Emesis/NG output     Total Output 2685 110   Net -1232 +283        Emesis Occurrence 1 x      PHYSICAL EXAMINATION: General: No distress, sitting in chair Neuro: Alert, normal strength HEENT: no sinus tenderness Cardiovascular: regular Lungs: no wheeze/rales Abdomen: soft, non tender Musculoskeletal: no edema  LABS: CBC Recent Labs     07/24/13  0615  07/25/13  0356  07/26/13  0350  WBC   --   10.9*  11.2*  HGB  10.9*  10.1*  9.6*  HCT  32.0*  28.5*  26.9*  PLT   --   213  174   BMET Recent Labs     07/24/13  0615  07/25/13  0356  07/26/13  0350  NA  128*  123*  127*  K  4.4  4.1  3.9  CL   --   85*  90*  CO2   --   28   30  BUN   --   23  12  CREATININE   --   0.89  0.79  GLUCOSE  114*  114*  123*   Electrolytes Recent Labs     07/25/13  0356  07/26/13  0350  CALCIUM  8.9  8.7   Cardiac Enzymes Recent Labs     07/25/13  1133  07/25/13  1451  07/25/13  2030  TROPONINI  <0.30  <0.30  <0.30   Imaging Dg Chest Port 1 View  07/26/2013   CLINICAL DATA:  Atelectasis and status post lumbar surgery.  EXAM: PORTABLE CHEST - 1 VIEW  COMPARISON:  07/16/2013  FINDINGS: Stable chronic lung disease. No airspace consolidation or significant atelectasis is identified. The heart size and mediastinal contours are within normal limits. No pleural effusions or pneumothorax identified.  IMPRESSION: Stable chronic lung disease.   Electronically Signed   By: Aletta Edouard M.D.   On: 07/26/2013 07:49     ASSESSMENT / PLAN:  A: Syncopal event 10/29 >> likely vasovagal. Hx of HTN, CAD, hyperlipidemia >> followed by Dr. Ron Parker with cardiology as outpt. P:  -monitor on tele -  resume lisinopril, zocor (use pravachol as outpt), ASA -Hold metoprolol, isosorbide for now  A: S/p Lumbar laminectomy - L3-L4. Hx seizure P:   -post op care per neurosurgery  -Continue tegretol   A: Chronic hyponatremia. P:   -even fluid balance -f/u BMET -hold chlorthalidone for now  A: Nausea/Vomiting - ?coffee ground emesis 10/29 >> none since. P:   -advance to regular diet -continue protonix  A: Anemia  P:  -f/u CBC -SCD for DVT prevention  A: Fever 10/30 (Tm 101.6) >> no other evidence for infection. P:   -foley d/c 10/30 -monitor fever curve >> if temp increase again then consider further evaluation/tx for infection  Summary: Okay to transfer to telemetry.  I have asked [facility] to assume medical management from 10/31.  PCCM will then sign off.  Chesley Mires, MD [facility] Pulmonary/Critical Care 07/26/2013, 10:52 AM Pager:  [phone] After 3pm call: [phone]

## 2013-07-26 NOTE — Progress Notes (Signed)
Subjective: Patient reports Feels much better today. Hungry and wants some coffee.  Objective: Vital signs in last 24 hours: Temp:  [98.4 F (36.9 C)-101.6 F (38.7 C)] 99.4 F (37.4 C) (10/30 0819) Pulse Rate:  [86-131] 99 (10/30 0700) Resp:  [14-42] 16 (10/30 0700) BP: (110-192)/(34-98) 126/48 mmHg (10/30 0700) SpO2:  [93 %-100 %] 97 % (10/30 0700)  Intake/Output from previous day: 10/29 0701 - 10/30 0700 In: 1453 [P.O.:250; I.V.:1203] Out: 2685 [Urine:2685] Intake/Output this shift:    incision is clean and dry. Motor function is intact.  Lab Results:  Recent Labs  07/25/13 0356 07/26/13 0350  WBC 10.9* 11.2*  HGB 10.1* 9.6*  HCT 28.5* 26.9*  PLT 213 174   BMET  Recent Labs  07/25/13 0356 07/26/13 0350  NA 123* 127*  K 4.1 3.9  CL 85* 90*  CO2 28 30  GLUCOSE 114* 123*  BUN 23 12  CREATININE 0.89 0.79  CALCIUM 8.9 8.7    Studies/Results: Dg Lumbar Spine 2-3 Views  07/24/2013   CLINICAL DATA:  Lumbar spine surgery.  EXAM: LUMBAR SPINE - 2-3 VIEW; DG C-ARM 1-60 MIN  COMPARISON:  06/18/2013  FINDINGS: Three intraoperative views were obtained. Patient has new bilateral pedicle screws at L3. Patient now has bilateral pedicle screws with rod fixation at L3, L4 and L5. Interbody devices at L3-L4 and L4-L5.  IMPRESSION: New posterior lumbar interbody fusion at L3-L4.   Electronically Signed   By: Markus Daft M.D.   On: 07/24/2013 13:20   Dg Chest Port 1 View  07/26/2013   CLINICAL DATA:  Atelectasis and status post lumbar surgery.  EXAM: PORTABLE CHEST - 1 VIEW  COMPARISON:  07/16/2013  FINDINGS: Stable chronic lung disease. No airspace consolidation or significant atelectasis is identified. The heart size and mediastinal contours are within normal limits. No pleural effusions or pneumothorax identified.  IMPRESSION: Stable chronic lung disease.   Electronically Signed   By: Aletta Edouard M.D.   On: 07/26/2013 07:49   Dg C-arm 1-60 Min  07/24/2013   CLINICAL  DATA:  Lumbar spine surgery.  EXAM: LUMBAR SPINE - 2-3 VIEW; DG C-ARM 1-60 MIN  COMPARISON:  06/18/2013  FINDINGS: Three intraoperative views were obtained. Patient has new bilateral pedicle screws at L3. Patient now has bilateral pedicle screws with rod fixation at L3, L4 and L5. Interbody devices at L3-L4 and L4-L5.  IMPRESSION: New posterior lumbar interbody fusion at L3-L4.   Electronically Signed   By: Markus Daft M.D.   On: 07/24/2013 13:20    Assessment/Plan: Stable    LOS: 2 days  Mobilize as  tolerated. Note Hgb 9.6. BP and heart rate ok. Transfuse if symptomatic, when mobilized.   Courtlynn Holloman J 07/26/2013, 8:39 AM

## 2013-07-26 NOTE — Evaluation (Addendum)
Physical Therapy Evaluation Patient Details Name: Carrie Gillespie MRN: [id number] DOB: November 22, 1919 Today's Date: 07/26/2013 Time: [id number] PT Time Calculation (min): 35 min  PT Assessment / Plan / Recommendation History of Present Illness  79 yo female with hx seizures, PVD admitted 10/28 for elective lumbar laminectomy.  POD #1 had episode bradycardia with HR 40's, (?vagal) with AMS as well as coffee ground emesis.  Pt quickly recovered after vagal episode with return of mental status, stable BP and HR 90's.  Still c/o nausea, back pain.  Denies chest pain, headache.   Clinical Impression  Patient is s/p L3-4 PLIF surgery resulting in functional limitations due to the deficits listed below (see PT Problem List). Pt very motivated to improve overall mobility however limited on evaluation due to overall fatigue and pain.  Patient will benefit from skilled PT to increase their independence and safety with mobility to allow discharge to the venue listed below.      PT Assessment  Patient needs continued PT services    Follow Up Recommendations  Home health PT;Supervision/Assistance - 24 hour    Equipment Recommendations  Rolling walker with 5" wheels (Pt has rollater however pt may benefit from 2 wheeled RW)    Frequency Min 5X/week    Precautions / Restrictions Precautions Precautions: Back Precaution Booklet Issued: Yes (comment) Required Braces or Orthoses: Spinal Brace Spinal Brace: Applied in sitting position Restrictions Weight Bearing Restrictions: No   Pertinent Vitals/Pain 8-9/10 back pain; RN notified      Mobility  Bed Mobility Bed Mobility: Rolling Left;Left Sidelying to Sit Rolling Left: 4: Min assist Left Sidelying to Sit: 4: Min assist;With rails Details for Bed Mobility Assistance: (A) to initiate roll and elevate shoulders OOB with cues for proper technique.  Transfers Transfers: Sit to Stand;Stand to Sit Sit to Stand: 4: Min assist;From bed Stand to Sit: 4:  Min assist;To chair/3-in-1 Details for Transfer Assistance: (A) to initiate transfer and slowly descend to chair with max cues for hand placement Ambulation/Gait Ambulation/Gait Assistance: 4: Min assist Ambulation Distance (Feet): 4 Feet Assistive device: Rolling walker Ambulation/Gait Assistance Details: (A) to maintian balance with cues for RW placement and body position within RW.  Pt limited due to overall fatigue.  Gait Pattern: Step-through pattern;Decreased stride length;Shuffle;Decreased trunk rotation Gait velocity: decreased due to pain Stairs: No    Exercises     PT Diagnosis: Difficulty walking;Generalized weakness;Acute pain  PT Problem List: Decreased strength;Decreased activity tolerance;Decreased balance;Decreased mobility;Decreased knowledge of use of DME;Decreased knowledge of precautions;Pain PT Treatment Interventions: DME instruction;Gait training;Stair training;Functional mobility training;Therapeutic activities;Therapeutic exercise;Balance training;Patient/family education     PT Goals(Current goals can be found in the care plan section) Acute Rehab PT Goals Patient Stated Goal: To go home PT Goal Formulation: With patient Time For Goal Achievement: 08/02/13 Potential to Achieve Goals: Good  Visit Information  Last PT Received On: 07/26/13 Assistance Needed: +1 History of Present Illness: 79 yo female with hx seizures, PVD admitted 10/28 for elective lumbar laminectomy.  POD #1 had episode bradycardia with HR 40's, (?vagal) with AMS as well as coffee ground emesis.  Pt quickly recovered after vagal episode with return of mental status, stable BP and HR 90's.  Still c/o nausea, back pain.  Denies chest pain, headache.        Prior [place] expects to be discharged to:: Private residence Living Arrangements: Alone Available Help at Discharge: Family;Available 24 hours/day Type of Home: House Home Access: Stairs to  enter Entrance  Stairs-Number of Steps: 1 Entrance Stairs-Rails: None Home Layout: Two level;Able to live on main level with bedroom/bathroom (laundry room in basement) Home Equipment: Walker - 4 wheels;Cane - single point Prior Function Level of Independence: Independent with assistive device(s) Comments: ambulates with rollater Communication Communication: No difficulties Dominant Hand: Right    Cognition  Cognition Arousal/Alertness: Awake/alert Behavior During Therapy: WFL for tasks assessed/performed Overall Cognitive Status: Within Functional Limits for tasks assessed    Extremity/Trunk Assessment Lower Extremity Assessment Lower Extremity Assessment: RLE deficits/detail;LLE deficits/detail RLE: Unable to fully assess due to pain RLE Sensation: history of peripheral neuropathy LLE: Unable to fully assess due to pain LLE Sensation: history of peripheral neuropathy   Balance Balance Balance Assessed: Yes Static Sitting Balance Static Sitting - Balance Support: Feet supported Static Sitting - Level of Assistance: 5: Stand by assistance  End of Session PT - End of Session Equipment Utilized During Treatment: Gait belt;Back brace Activity Tolerance: Patient tolerated treatment well Patient left: in chair;with call bell/phone within reach Nurse Communication: Mobility status;Precautions  GP     Kymberlie Brazeau 07/26/2013, 12:08 PM  Antoine Poche, [place] DPT [phone]

## 2013-07-26 NOTE — Clinical Social Work Note (Signed)
Clinical Social Work Department BRIEF PSYCHOSOCIAL ASSESSMENT 07/26/2013  Patient:  Carrie Gillespie, Carrie Gillespie     Account Number:  [id number]     Admit date:  07/24/2013  Clinical Social Worker:  Myles Lipps  Date/Time:  07/26/2013 04:30 PM  Referred by:  Physician  Date Referred:  07/25/2013 Referred for  SNF Placement   Other Referral:   Interview type:  Family Other interview type:   Patient son and daughter in law at bedside    PSYCHOSOCIAL DATA Living Status:  ALONE Admitted from facility:   Level of care:   Primary support name:  Dalbert Batman  [phone] / [phone] Primary support relationship to patient:  CHILD, ADULT Degree of support available:   Strong    CURRENT CONCERNS Current Concerns  Post-Acute Placement   Other Concerns:    SOCIAL WORK ASSESSMENT / PLAN Clinical Social Worker met with patient son and daughter in law at bedside to offer support and discuss patient needs at discharge.  Patient son states that patient currently lives alone down the street from the children in [place]. Patient's husband passed away about a month ago and patient children have been checking on patient periodically throughout the day.  Per PT/OT patient is able to return home with home health and 24 hour supervision.  Patient son states that he thinks that may be an option but would like to talk with his sisters first.  Patient son requested CSW follow up tomorrow regarding discharge plans for patient, home with home health vs. SNF.    Clinical Social Worker will initiate SNF referral to [place], per family request, if patient and family determine SNF is best discharge option.  CSW remains available for support and to assist with patient discharge needs once medically stable.   Assessment/plan status:  Psychosocial Support/Ongoing Assessment of Needs Other assessment/ plan:   Information/referral to community resources:   Holiday representative provided social work contact  information for patient family to follow up or for questions regarding discharge planning.    PATIENT'S/FAMILY'S RESPONSE TO PLAN OF CARE: Patient alert and oriented x3 asleep in the bed.  Patient son and daughter in law state that between all 3 children, patient has a good strong support system at home.  Patient family seems very willing to assist as much as possible. Patient family understanding of social work role and expressed appreciation for CSW support and concern.

## 2013-07-27 LAB — CBC
HCT: 27.6 % — ABNORMAL LOW (ref 36.0–46.0)
Hemoglobin: 9.6 g/dL — ABNORMAL LOW (ref 12.0–15.0)
MCH: 31.4 pg (ref 26.0–34.0)
MCHC: 34.8 g/dL (ref 30.0–36.0)
MCV: 90.2 fL (ref 78.0–100.0)
RDW: 13.8 % (ref 11.5–15.5)
WBC: 11.6 10*3/uL — ABNORMAL HIGH (ref 4.0–10.5)

## 2013-07-27 LAB — URINALYSIS, ROUTINE W REFLEX MICROSCOPIC
Bilirubin Urine: NEGATIVE
Protein, ur: 30 mg/dL — AB
Specific Gravity, Urine: 1.022 (ref 1.005–1.030)
Urobilinogen, UA: 1 mg/dL (ref 0.0–1.0)

## 2013-07-27 LAB — BASIC METABOLIC PANEL
BUN: 18 mg/dL (ref 6–23)
CO2: 29 mEq/L (ref 19–32)
Calcium: 9.1 mg/dL (ref 8.4–10.5)
Chloride: 92 mEq/L — ABNORMAL LOW (ref 96–112)
Creatinine, Ser: 0.81 mg/dL (ref 0.50–1.10)
Glucose, Bld: 131 mg/dL — ABNORMAL HIGH (ref 70–99)
Sodium: 130 mEq/L — ABNORMAL LOW (ref 135–145)

## 2013-07-27 LAB — URINE MICROSCOPIC-ADD ON

## 2013-07-27 MED ORDER — TAMSULOSIN HCL 0.4 MG PO CAPS
0.4000 mg | ORAL_CAPSULE | Freq: Every day | ORAL | Status: DC
  Administered 2013-07-27 – 2013-08-01 (×5): 0.4 mg via ORAL
  Filled 2013-07-27 (×6): qty 1

## 2013-07-27 NOTE — Clinical Social Work Note (Signed)
Clinical Social Worker continuing to follow patient and family for support and discharge planning needs.  CSW spoke with patient at bedside to further discuss plans.  Patient states that she is agreeable with SNF placement, however due to her negative previous experience at [facility] she would prefer placement at [facility] in [place].  CSW to submit clinicals/initiate referral and follow up with patient family regarding potential bed offer.  Patient plans to inform patient daughters of update.  CSW remains available for support and to facilitate patient discharge needs.  Barbette Or, [place]

## 2013-07-27 NOTE — Progress Notes (Signed)
Agree with Treatment Note.  [place], [place] DPT [phone]

## 2013-07-27 NOTE — Plan of Care (Signed)
Problem: Consults Goal: Diagnosis - Spinal Surgery Outcome: Completed/Met Date Met:  07/27/13 Lumbar fusion

## 2013-07-27 NOTE — Progress Notes (Signed)
[facility] CONSULT F/U Note [place] TEAM 1 - Stepdown/ICU TEAM   HENESIS MULLINS [id number] DOB: [birth year] DOA: 07/24/2013 PCP: Curlene Labrum, MD  Admit HPI / Brief Narrative: 79 yo female with hx seizures and PVD admitted 10/28 for elective lumbar laminectomy. POD #1 had episode bradycardia with HR 40's, (?vagal) with AMS as well as coffee ground emesis. Pt quickly recovered after episode with return of mental status, stable BP and HR 90's.   SIGNIFICANT EVENTS / STUDIES:  10/28 lumbar laminectomy L3-L4  10/29 vagal, coffee ground emesis   Assessment/Plan:  Syncopal event 10/29 likely vasovagal - no recurrence   Nausea/Vomiting - ?coffee ground emesis 10/29 none since - Hgb stable - tolerating diet   S/p Lumbar laminectomy - L3-L4 Care per primary team Neurosurgery   Hx seizure No evidence of active sz at this time  Chronic hyponatremia Does not need to resume chlorthalidone - Na slowly improving - will follow   HTN BP currently well controlled  CAD Asymptomatic   Code Status: FULL Family Communication: no family present at time of exam  Antibiotics: none  DVT prophylaxis: SCDs  HPI/Subjective: The patient is sitting up in bed eating lunch without difficulty the time of my exam.  She denies lightheadedness dizziness chest pain shortness of breath nausea or vomiting.  Objective: Blood pressure 125/44, pulse 88, temperature 97.5 F (36.4 C), temperature source Oral, resp. rate 13, height 5' (1.524 m), weight 81.3 kg (179 lb 3.7 oz), SpO2 95.00%.  Intake/Output Summary (Last 24 hours) at 07/27/13 1612 Last data filed at 07/27/13 1500  Gross per 24 hour  Intake    843 ml  Output   1125 ml  Net   -282 ml   Exam: General: No acute respiratory distress Lungs: Clear to auscultation bilaterally without wheezes or crackles Cardiovascular: Regular rate and rhythm without murmur gallop or rub normal S1 and S2 Abdomen: Nontender, nondistended, soft,  bowel sounds positive, no rebound, no ascites, no appreciable mass Extremities: No significant cyanosis, clubbing, or edema bilateral lower extremities  Data Reviewed: Basic Metabolic Panel:  Recent Labs Lab 07/24/13 0615 07/25/13 0356 07/26/13 0350 07/27/13 0530  NA 128* 123* 127* 130*  K 4.4 4.1 3.9 4.3  CL  --  85* 90* 92*  CO2  --  28 30 29   GLUCOSE 114* 114* 123* 131*  BUN  --  23 12 18   CREATININE  --  0.89 0.79 0.81  CALCIUM  --  8.9 8.7 9.1   Liver Function Tests: No results found for this basename: AST, ALT, ALKPHOS, BILITOT, PROT, ALBUMIN,  in the last 168 hours  CBC:  Recent Labs Lab 07/24/13 0615 07/25/13 0356 07/26/13 0350 07/27/13 0530  WBC  --  10.9* 11.2* 11.6*  HGB 10.9* 10.1* 9.6* 9.6*  HCT 32.0* 28.5* 26.9* 27.6*  MCV  --  88.5 89.1 90.2  PLT  --  213 174 190   Cardiac Enzymes:  Recent Labs Lab 07/25/13 1133 07/25/13 1451 07/25/13 2030  TROPONINI <0.30 <0.30 <0.30   Studies:  Recent x-ray studies have been reviewed in detail by the Attending Physician  Scheduled Meds:  Scheduled Meds: . aspirin EC  81 mg Oral Daily  . carbamazepine  200 mg Oral Daily  . docusate sodium  100 mg Oral BID  . lisinopril  10 mg Oral Daily  . pantoprazole  40 mg Oral Q1200  . senna  1 tablet Oral BID  . simvastatin  20 mg Oral QHS  .  sodium chloride  3 mL Intravenous Q12H  . sodium chloride  2 g Oral TID WC  . tamsulosin  0.4 mg Oral Daily    Time spent on care of this patient: 35 mins   [place]  [phone] Pager - Text Page per Shea Evans as per below:  On-Call/Text Page:      [email or web address]      password [email or web address]  If 7PM-7AM, please contact night-coverage [email or web address] Password [email or web address] 07/27/2013, 4:12 PM   LOS: 3 days

## 2013-07-27 NOTE — Clinical Social Work Placement (Signed)
Clinical Social Work Department CLINICAL SOCIAL WORK PLACEMENT NOTE 07/27/2013  Patient:  Carrie Gillespie, Carrie Gillespie  Account Number:  [id number] Admit date:  07/24/2013  Clinical Social Worker:  Barbette Or, LCSW  Date/time:  07/27/2013 02:00 PM  Clinical Social Work is seeking post-discharge placement for this patient at the following level of care:   [place]   (*CSW will update this form in Epic as items are completed)   07/27/2013  Patient/family provided with [place] Department of Clinical Social Work's list of facilities offering this level of care within the geographic area requested by the patient (or if unable, by the patient's family).  07/27/2013  Patient/family informed of their freedom to choose among providers that offer the needed level of care, that participate in Medicare, Medicaid or managed care program needed by the patient, have an available bed and are willing to accept the patient.  07/27/2013  Patient/family informed of [facility]' ownership interest in [facility], as well as of the fact that they are under no obligation to receive care at this facility.  PASARR submitted to [employer] on  PASARR number received from [place] on   FL2 transmitted to all facilities in geographic area requested by pt/family on  07/27/2013 FL2 transmitted to all facilities within larger geographic area on   Patient informed that his/her managed care company has contracts with or will negotiate with  certain facilities, including the following:     Patient/family informed of bed offers received:   Patient chooses bed at  Physician recommends and patient chooses bed at    Patient to be transferred to  on   Patient to be transferred to facility by   The following physician request were entered in Epic:   Additional Comments: 10/31 No [place] due to [place] placement

## 2013-07-27 NOTE — Progress Notes (Signed)
Occupational Therapy Treatment Patient Details Name: Carrie Gillespie MRN: [id number] DOB: [birth year] Today's Date: 07/27/2013 Time: [id number] OT Time Calculation (min): 29 min  OT Assessment / Plan / Recommendation  History of present illness 79 yo female with hx seizures, PVD admitted 10/28 for elective lumbar laminectomy.  POD #1 had episode bradycardia with HR 40's, (?vagal) with AMS as well as coffee ground emesis.  Pt quickly recovered after vagal episode with return of mental status, stable BP and HR 90's.  Still c/o nausea, back pain.  Denies chest pain, headache.    OT comments  Pt making progress. Limited by sit to stand and bed mobility. Feel pt is unsafe to d/C home at this level and will benefit from rehab at SNF to facilitate safe return home.  Follow Up Recommendations  SNF    Barriers to Discharge       Equipment Recommendations  3 in 1 bedside comode;Tub/shower bench    Recommendations for Other Services    Frequency Min 2X/week   Progress towards OT Goals Progress towards OT goals: Progressing toward goals  Plan Discharge plan remains appropriate    Precautions / Restrictions Precautions Precautions: Back Required Braces or Orthoses: Spinal Brace Spinal Brace: Applied in sitting position;Applied in standing position   Pertinent Vitals/Pain Pain back 6/10.     ADL  Toilet Transfer: Minimal assistance Toilet Transfer Method: Sit to stand;Other (comment) (ambulating) Toilet Transfer Equipment: Bedside commode Toileting - Clothing Manipulation and Hygiene: Maximal assistance Transfers/Ambulation Related to ADLs: moD A from lower surface ADL Comments: able to recall 1/3 backk precautions    OT Diagnosis:    OT Problem List:   OT Treatment Interventions:     OT Goals(current goals can now be found in the care plan section) Acute Rehab OT Goals Patient Stated Goal: To increase strength OT Goal Formulation: With patient Time For Goal Achievement:  08/09/13 Potential to Achieve Goals: Good ADL Goals Pt Will Perform Lower Body Bathing: with min assist;with adaptive equipment;sit to/from stand Pt Will Perform Lower Body Dressing: with min assist;with adaptive equipment;sit to/from stand Pt Will Transfer to Toilet: with min assist;ambulating;bedside commode Pt Will Perform Toileting - Clothing Manipulation and hygiene: with min assist;sit to/from stand;with adaptive equipment Additional ADL Goal #1: Pt will verbalize 3/3 back precautions independently  Visit Information  Last OT Received On: 07/27/13 Assistance Needed: +1 History of Present Illness: 79 yo female with hx seizures, PVD admitted 10/28 for elective lumbar laminectomy.  POD #1 had episode bradycardia with HR 40's, (?vagal) with AMS as well as coffee ground emesis.  Pt quickly recovered after vagal episode with return of mental status, stable BP and HR 90's.  Still c/o nausea, back pain.  Denies chest pain, headache.     Subjective Data      Prior Functioning       Cognition  Cognition Arousal/Alertness: Awake/alert Behavior During Therapy: WFL for tasks assessed/performed Overall Cognitive Status: Within Functional Limits for tasks assessed    Mobility  Bed Mobility Bed Mobility: Sit to Sidelying Left Sit to Sidelying Left: 2: Max assist Details for Bed Mobility Assistance: assist to lower trunk and lift BOTH legs Transfers Transfers: Sit to Stand;Stand to Sit Sit to Stand: 3: Mod assist;From chair/3-in-1 Stand to Sit: 4: Min assist;To bed Details for Transfer Assistance: A to stand from lower surface    Exercises  Other Exercises Other Exercises: encouraged BL and UE exercise   Balance     End of Session OT - End  of Session Equipment Utilized During Treatment: Gait belt;Rolling walker;Back brace Activity Tolerance: Patient tolerated treatment well Patient left: in bed;with call bell/phone within reach;with family/visitor present Nurse Communication:  Mobility status  GO     Woods Gangemi,HILLARY 07/27/2013, 1:22 PM [facility], OTR/L  [phone] 07/27/2013

## 2013-07-27 NOTE — Progress Notes (Signed)
UR completed.  Jamison Soward, RN BSN M[facility] CCM Trauma/Neuro ICU Case Manager [phone]  

## 2013-07-27 NOTE — Progress Notes (Signed)
Subjective: Patient reports No complaints of pain... Having a pretty good day.  Objective: Vital signs in last 24 hours: Temp:  [97.5 F (36.4 C)-98.3 F (36.8 C)] 97.5 F (36.4 C) (10/31 1600) Pulse Rate:  [76-112] 83 (10/31 1700) Resp:  [10-24] 20 (10/31 1700) BP: (106-148)/(44-57) 125/44 mmHg (10/31 1600) SpO2:  [87 %-100 %] 98 % (10/31 1700)  Intake/Output from previous day: 10/30 0701 - 10/31 0700 In: 1043 [P.O.:840; I.V.:203] Out: 885 [Urine:885] Intake/Output this shift: Total I/O In: 603 [P.O.:600; I.V.:3] Out: 450 [Urine:450]  incision is clean and dry. motor function is intact.  Lab Results:  Recent Labs  07/26/13 0350 07/27/13 0530  WBC 11.2* 11.6*  HGB 9.6* 9.6*  HCT 26.9* 27.6*  PLT 174 190   BMET  Recent Labs  07/26/13 0350 07/27/13 0530  NA 127* 130*  K 3.9 4.3  CL 90* 92*  CO2 30 29  GLUCOSE 123* 131*  BUN 12 18  CREATININE 0.79 0.81  CALCIUM 8.7 9.1    Studies/Results: Dg Chest Port 1 View  07/26/2013   CLINICAL DATA:  Atelectasis and status post lumbar surgery.  EXAM: PORTABLE CHEST - 1 VIEW  COMPARISON:  07/16/2013  FINDINGS: Stable chronic lung disease. No airspace consolidation or significant atelectasis is identified. The heart size and mediastinal contours are within normal limits. No pleural effusions or pneumothorax identified.  IMPRESSION: Stable chronic lung disease.   Electronically Signed   By: Aletta Edouard M.D.   On: 07/26/2013 07:49    Assessment/Plan: Stable post op. To transfer to floor.   LOS: 3 days  Transfer to [place] whenbed available.   Tersa Fotopoulos J 07/27/2013, 5:24 PM

## 2013-07-27 NOTE — Progress Notes (Signed)
Physical Therapy Treatment Patient Details Name: Carrie Gillespie MRN: [id number] DOB: Feb 26, 1920 Today's Date: 07/27/2013 Time: 0835-0900 PT Time Calculation (min): 25 min  PT Assessment / Plan / Recommendation  History of Present Illness 79 yo female with hx seizures, PVD admitted 10/28 for elective lumbar laminectomy.  POD #1 had episode bradycardia with HR 40's, (?vagal) with AMS as well as coffee ground emesis.  Pt quickly recovered after vagal episode with return of mental status, stable BP and HR 90's.  Still c/o nausea, back pain.  Denies chest pain, headache.    PT Comments   Patient requires minimal assistance for standing transfers and close supervision for ambulation.  Daughters present for session and discussed concerns with their ability to care for the pt, and request the patient go to SNF near her family in [place] for rehab to increase function before d/c to home.  Patient will benefit from skilled PT intervention to increase function and rehabilitate as she recovers from surgery.   Follow Up Recommendations  SNF     Does the patient have the potential to tolerate intense rehabilitation   yes  Barriers to Discharge        Equipment Recommendations  Rolling walker with 5" wheels (Pt has rollator, however may benefit from 2 wheeled walker)    Recommendations for Other Services    Frequency Min 5X/week   Progress towards PT Goals    Plan      Precautions / Restrictions Precautions Precautions: Back Precaution Booklet Issued: Yes (comment) Required Braces or Orthoses: Spinal Brace Spinal Brace: Applied in sitting position;Applied in standing position Restrictions Weight Bearing Restrictions: No    Pertinent Vitals/Pain Pain 10/10 when first standing, decreases with gradual movement    Mobility  Transfers Transfers: Sit to Stand Sit to Stand: 4: Min assist Stand to Sit: 5: Supervision Details for Transfer Assistance: Able to descend with control for limited  distance before flopping into chair Ambulation/Gait Ambulation/Gait Assistance: 5: Supervision Ambulation Distance (Feet): 30 Feet Assistive device: Rolling walker Ambulation/Gait Assistance Details: Distance limited by fatigue.  Requires VC for correct walker placement. Gait Pattern: Step-through pattern Gait velocity: decreased due to pain and fatigue Stairs: No    Exercises     PT Diagnosis:    PT Problem List:   PT Treatment Interventions:     PT Goals (current goals can now be found in the care plan section) Acute Rehab PT Goals Patient Stated Goal: To increase strength PT Goal Formulation: With patient Time For Goal Achievement: 08/02/13 Potential to Achieve Goals: Good  Visit Information  Assistance Needed: +1 History of Present Illness: 79 yo female with hx seizures, PVD admitted 10/28 for elective lumbar laminectomy.  POD #1 had episode bradycardia with HR 40's, (?vagal) with AMS as well as coffee ground emesis.  Pt quickly recovered after vagal episode with return of mental status, stable BP and HR 90's.  Still c/o nausea, back pain.  Denies chest pain, headache.     Subjective Data  Patient Stated Goal: To increase strength   Cognition  Cognition Arousal/Alertness: Awake/alert Behavior During Therapy: WFL for tasks assessed/performed Overall Cognitive Status: Within Functional Limits for tasks assessed    Balance  Static Sitting Balance Static Sitting - Balance Support: Feet supported Static Sitting - Level of Assistance: 5: Stand by assistance  End of Session PT - End of Session Equipment Utilized During Treatment: Gait belt Activity Tolerance: Patient limited by fatigue;Patient limited by pain Patient left: in chair Nurse Communication: Mobility  status (family request for SNF placement)   GP     Cordelia Poche 07/27/2013, 9:08 AM

## 2013-07-28 LAB — BASIC METABOLIC PANEL
BUN: 14 mg/dL (ref 6–23)
Calcium: 8.9 mg/dL (ref 8.4–10.5)
Chloride: 88 mEq/L — ABNORMAL LOW (ref 96–112)
GFR calc non Af Amer: 72 mL/min — ABNORMAL LOW (ref >90)
Glucose, Bld: 134 mg/dL — ABNORMAL HIGH (ref 70–99)
Potassium: 3.9 mEq/L (ref 3.5–5.1)

## 2013-07-28 MED ORDER — BISACODYL 10 MG RE SUPP: 10.0000 mg | Freq: Once | RECTAL | Status: AC

## 2013-07-28 MED ORDER — MAGNESIUM CITRATE PO SOLN
0.5000 | Freq: Once | ORAL | Status: AC
  Administered 2013-07-28: 0.5 via ORAL

## 2013-07-28 MED ORDER — DEXTROSE 5 % IV SOLN
1.0000 g | INTRAVENOUS | Status: DC
  Administered 2013-07-28 – 2013-07-30 (×3): 1 g via INTRAVENOUS
  Filled 2013-07-28 (×4): qty 10

## 2013-07-28 MED ORDER — POLYETHYLENE GLYCOL 3350 17 G PO PACK
17.0000 g | PACK | Freq: Every day | ORAL | Status: DC
  Filled 2013-07-28 (×2): qty 1

## 2013-07-28 MED ORDER — OXYCODONE-ACETAMINOPHEN 5-325 MG PO TABS
1.0000 | ORAL_TABLET | ORAL | Status: DC | PRN
  Administered 2013-07-28 – 2013-08-01 (×14): 1 via ORAL
  Filled 2013-07-28 (×14): qty 1

## 2013-07-28 NOTE — Progress Notes (Signed)
Patient ID: Carrie Gillespie, female   DOB: [birth year], 79 y.o.   MRN: [id number] Subjective: Patient reports emesis again today. Back sore, Very little flatus. Some knee pain but no radic pain.  Objective: Vital signs in last 24 hours: Temp:  [97.5 F (36.4 C)-99 F (37.2 C)] 99 F (37.2 C) (11/01 0123) Pulse Rate:  [83-112] 100 (11/01 0123) Resp:  [10-26] 18 (11/01 0123) BP: (125-182)/(44-87) 146/60 mmHg (11/01 0123) SpO2:  [92 %-98 %] 97 % (11/01 0123)  Intake/Output from previous day: 10/31 0701 - 11/01 0700 In: 603 [P.O.:600; I.V.:3] Out: 550 [Urine:550] Intake/Output this shift:    Neurologic: Grossly normal  Lab Results: Lab Results  Component Value Date   WBC 11.6* 07/27/2013   HGB 9.6* 07/27/2013   HCT 27.6* 07/27/2013   MCV 90.2 07/27/2013   PLT 190 07/27/2013   No results found for this basename: INR, PROTIME   BMET Lab Results  Component Value Date   NA 127* 07/28/2013   K 3.9 07/28/2013   CL 88* 07/28/2013   CO2 30 07/28/2013   GLUCOSE 134* 07/28/2013   BUN 14 07/28/2013   CREATININE 0.73 07/28/2013   CALCIUM 8.9 07/28/2013    Studies/Results: No results found.  Assessment/Plan: Hold solid food, may have mild ileus. PT. Limit pain meds if she can.   LOS: 4 days    Nolie Bignell S 07/28/2013, 9:12 AM

## 2013-07-28 NOTE — Progress Notes (Signed)
[facility] CONSULT F/U Note [place] TEAM 1 - Stepdown/ICU TEAM   Carrie Gillespie [id number] DOB: 01-Jun-1920 DOA: 07/24/2013 PCP: Curlene Labrum, MD  Admit HPI / Brief Narrative: 79 yo female with hx seizures and PVD admitted 10/28 for elective lumbar laminectomy. POD #1 had episode bradycardia with HR 40's (?vagal) with AMS as well as coffee ground emesis. Pt quickly recovered after episode with return of mental status, stable BP and HR 90's.   SIGNIFICANT EVENTS / STUDIES:  10/28 lumbar laminectomy L3-L4  10/29 vagal, coffee ground emesis   Assessment/Plan:  Syncopal event 10/29 likely vasovagal - no recurrence   Nausea/Vomiting - ?coffee ground emesis 10/29 Hgb stable but did have an episode of non-bloody emesis this morning, likely related to UTI +/- illeus - tx each issue and follow clinically   Gram negative rod UTI/Pyelo Begin empiric tx w/ ceftriaxone - follow culture for speciation and sensitivity   S/p Lumbar laminectomy - L3-L4 Care per primary team Neurosurgery   Hx seizure No evidence of active sz at this time  Chronic hyponatremia Does not need to resume chlorthalidone - Na appears to be stable within her baseline range (127-130)  HTN BP currently well controlled  CAD Asymptomatic   Code Status: FULL Family Communication: no family present at time of exam  Antibiotics: ceftriaxone 11/1 >>  DVT prophylaxis: SCDs  HPI/Subjective: The patient suffered an episode of nonbloody emesis this morning.  She has not had a recurrence since that time.  She admits to poor appetite.  She admits that she has not had a bowel movement in multiple days.  She denies chest pain shortness of breath fevers chills or headache.  Objective: Blood pressure 149/75, pulse 85, temperature 97.6 F (36.4 C), temperature source Oral, resp. rate 18, height 5' (1.524 m), weight 81.3 kg (179 lb 3.7 oz), SpO2 97.00%.  Intake/Output Summary (Last 24 hours) at 07/28/13  1436 Last data filed at 07/27/13 1800  Gross per 24 hour  Intake    120 ml  Output    400 ml  Net   -280 ml   Exam: General: No acute respiratory distress Lungs: Clear to auscultation bilaterally without wheezes or crackles Cardiovascular: Regular rate and rhythm without murmur gallop or rub normal S1 and S2 Abdomen: Nontender, nondistended, soft, bowel sounds hypoactive but present, no rebound, no ascites, no appreciable mass Extremities: No significant cyanosis, clubbing, or edema bilateral lower extremities  Data Reviewed: Basic Metabolic Panel:  Recent Labs Lab 07/24/13 0615 07/25/13 0356 07/26/13 0350 07/27/13 0530 07/28/13 0605  NA 128* 123* 127* 130* 127*  K 4.4 4.1 3.9 4.3 3.9  CL  --  85* 90* 92* 88*  CO2  --  28 30 29 30   GLUCOSE 114* 114* 123* 131* 134*  BUN  --  23 12 18 14   CREATININE  --  0.89 0.79 0.81 0.73  CALCIUM  --  8.9 8.7 9.1 8.9   Liver Function Tests: No results found for this basename: AST, ALT, ALKPHOS, BILITOT, PROT, ALBUMIN,  in the last 168 hours  CBC:  Recent Labs Lab 07/24/13 0615 07/25/13 0356 07/26/13 0350 07/27/13 0530  WBC  --  10.9* 11.2* 11.6*  HGB 10.9* 10.1* 9.6* 9.6*  HCT 32.0* 28.5* 26.9* 27.6*  MCV  --  88.5 89.1 90.2  PLT  --  213 174 190   Cardiac Enzymes:  Recent Labs Lab 07/25/13 1133 07/25/13 1451 07/25/13 2030  TROPONINI <0.30 <0.30 <0.30   Studies:  Recent  x-ray studies have been reviewed in detail by the Attending Physician  Scheduled Meds:  Scheduled Meds: . aspirin EC  81 mg Oral Daily  . carbamazepine  200 mg Oral Daily  . docusate sodium  100 mg Oral BID  . lisinopril  10 mg Oral Daily  . pantoprazole  40 mg Oral Q1200  . senna  1 tablet Oral BID  . simvastatin  20 mg Oral QHS  . sodium chloride  2 g Oral TID WC  . tamsulosin  0.4 mg Oral Daily    Time spent on care of this patient: 25 mins   [place]  [phone] Pager - Text Page per Shea Evans as  per below:  On-Call/Text Page:      [email or web address]      password [email or web address]  If 7PM-7AM, please contact night-coverage [email or web address] Password [email or web address] 07/28/2013, 2:36 PM   LOS: 4 days

## 2013-07-28 NOTE — Progress Notes (Signed)
Physical Therapy Treatment Patient Details Name: Carrie Gillespie MRN: [id number] DOB: 06-25-1920 Today's Date: 07/28/2013 Time: [id number] PT Time Calculation (min): 20 min  PT Assessment / Plan / Recommendation  History of Present Illness 79 yo female with hx seizures, PVD admitted 10/28 for elective lumbar laminectomy.  POD #1 had episode bradycardia with HR 40's, (?vagal) with AMS as well as coffee ground emesis.  Pt quickly recovered after vagal episode with return of mental status, stable BP and HR 90's.  Still c/o nausea, back pain.  Denies chest pain, headache.    PT Comments   Pt progressing slow. Pt with decreased spirits due to nausea, inability to have BM and limited desire to eat. Pt moving well however remains unsafe to d/c home alone.   Follow Up Recommendations  SNF     Does the patient have the potential to tolerate intense rehabilitation     Barriers to Discharge        Equipment Recommendations  Rolling walker with 5" wheels    Recommendations for Other Services    Frequency Min 5X/week   Progress towards PT Goals Progress towards PT goals: Progressing toward goals  Plan Current plan remains appropriate    Precautions / Restrictions Precautions Precautions: Back Precaution Comments: pt able to recall 2/3 precautions Required Braces or Orthoses: Spinal Brace Spinal Brace: Applied in sitting position;Lumbar corset Restrictions Weight Bearing Restrictions: No   Pertinent Vitals/Pain Minimal pain    Mobility  Bed Mobility Bed Mobility: Rolling Right;Right Sidelying to Sit;Sit to Sidelying Right Rolling Right: 4: Min assist;With rail Right Sidelying to Sit: 4: Min assist;With rails;HOB flat Sit to Sidelying Right: 4: Min assist;With rail;HOB flat Details for Bed Mobility Assistance: assist for trunk elevation into sitting EOB, and LE management into the bed Transfers Transfers: Sit to Stand;Stand to Sit Sit to Stand: With upper extremity assist;From bed;3:  Mod assist Stand to Sit: 3: Mod assist;With upper extremity assist;To bed (very guarded/cautious descent) Details for Transfer Assistance: v/c's for hand placement, assistance to intiate sit to stand Ambulation/Gait Ambulation/Gait Assistance: 4: Min guard Ambulation Distance (Feet): 50 Feet Assistive device: Rolling walker Ambulation/Gait Assistance Details: pt guarded and slow, good walker management Gait Pattern: Step-through pattern Gait velocity: decreased Stairs: No    Exercises     PT Diagnosis:    PT Problem List:   PT Treatment Interventions:     PT Goals (current goals can now be found in the care plan section) Acute Rehab PT Goals Patient Stated Goal: to feel better  Visit Information  Last PT Received On: 07/28/13 Assistance Needed: +1 History of Present Illness: 79 yo female with hx seizures, PVD admitted 10/28 for elective lumbar laminectomy.  POD #1 had episode bradycardia with HR 40's, (?vagal) with AMS as well as coffee ground emesis.  Pt quickly recovered after vagal episode with return of mental status, stable BP and HR 90's.  Still c/o nausea, back pain.  Denies chest pain, headache.     Subjective Data  Patient Stated Goal: to feel better   Cognition  Cognition Arousal/Alertness: Awake/alert Behavior During Therapy: WFL for tasks assessed/performed Overall Cognitive Status: Within Functional Limits for tasks assessed    Balance     End of Session PT - End of Session Equipment Utilized During Treatment: Gait belt Activity Tolerance: Patient limited by fatigue Patient left: in bed;with call bell/phone within reach Nurse Communication: Mobility status   GP     Kingsley Callander 07/28/2013, 3:32 PM  Kittie Plater,  PT, DPT Pager #: [phone] Office #: [phone]

## 2013-07-29 DIAGNOSIS — I517 Cardiomegaly: Secondary | ICD-10-CM

## 2013-07-29 DIAGNOSIS — R001 Bradycardia, unspecified: Secondary | ICD-10-CM | POA: Diagnosis not present

## 2013-07-29 DIAGNOSIS — R55 Syncope and collapse: Secondary | ICD-10-CM

## 2013-07-29 DIAGNOSIS — N390 Urinary tract infection, site not specified: Secondary | ICD-10-CM | POA: Diagnosis not present

## 2013-07-29 DIAGNOSIS — Z9889 Other specified postprocedural states: Secondary | ICD-10-CM

## 2013-07-29 DIAGNOSIS — R112 Nausea with vomiting, unspecified: Secondary | ICD-10-CM | POA: Diagnosis not present

## 2013-07-29 DIAGNOSIS — I498 Other specified cardiac arrhythmias: Secondary | ICD-10-CM

## 2013-07-29 LAB — CBC
Hemoglobin: 10.2 g/dL — ABNORMAL LOW (ref 12.0–15.0)
MCH: 31.4 pg (ref 26.0–34.0)
MCHC: 35.1 g/dL (ref 30.0–36.0)
Platelets: 274 10*3/uL (ref 150–400)
WBC: 14 10*3/uL — ABNORMAL HIGH (ref 4.0–10.5)

## 2013-07-29 LAB — URINE CULTURE: Colony Count: >=100000

## 2013-07-29 MED ORDER — PANTOPRAZOLE SODIUM 40 MG PO TBEC
40.0000 mg | DELAYED_RELEASE_TABLET | Freq: Two times a day (BID) | ORAL | Status: DC
  Administered 2013-07-29 – 2013-08-01 (×6): 40 mg via ORAL
  Filled 2013-07-29 (×6): qty 1

## 2013-07-29 MED ORDER — POLYETHYLENE GLYCOL 3350 17 G PO PACK
17.0000 g | PACK | Freq: Two times a day (BID) | ORAL | Status: DC
  Administered 2013-07-29 – 2013-07-31 (×5): 17 g via ORAL
  Filled 2013-07-29 (×7): qty 1

## 2013-07-29 MED ORDER — WHITE PETROLATUM GEL
Status: AC
  Administered 2013-07-29: 17:00:00
  Filled 2013-07-29: qty 5

## 2013-07-29 NOTE — Progress Notes (Signed)
VASCULAR LAB PRELIMINARY  PRELIMINARY  PRELIMINARY  PRELIMINARY  Carotid Dopplers completed.    Preliminary report:  There is 1-39% ICA stenosis.  Vertebral artery flow is antegrade.  Temara Lanum, RVT 07/29/2013, 4:04 PM

## 2013-07-29 NOTE — Progress Notes (Signed)
  Echocardiogram 2D Echocardiogram has been performed.  Basilia Jumbo 07/29/2013, 3:43 PM

## 2013-07-29 NOTE — Progress Notes (Signed)
Pt complained of nausea and abdominal pain and needed to go to the bathroom. Pt went to restroom and while on the toilet pt became unresponsive and gasped for air. Rapid response was called and code blue was initiated. Pt regained consciousness and code was cancelled. Pt returned to bed with assistance and placed on 4L of oxygen. BP 205/76, Pulse 99, O2 sat 99%, CBG 151. EKG showed sinus rhythm. Checked telemetry and it shows a period of bradycardia during the syncopal episode. Called surgeon on call and no further orders received. Also notified Walden Field NP for [facility], no new orders received. Pt given Zofran IV for nausea. Pt vomited once shortly after receiving Zofran. BP decreased to 173/60. Pt returned to normal mental status and is resting in bed. Will continue to monitor for any additional symptoms.

## 2013-07-29 NOTE — Progress Notes (Addendum)
[facility] CONSULT F/U Note    Carrie Gillespie [id number] DOB: 01-10-1920 DOA: 07/24/2013 PCP: Curlene Labrum, MD  Admit HPI / Brief Narrative: 79 yo female with hx seizures and PVD admitted 10/28 for elective lumbar laminectomy. POD #1 had episode bradycardia with HR 40's (?vagal) with AMS as well as coffee ground emesis. Pt quickly recovered after episode with return of mental status, stable BP and HR 90's.   SIGNIFICANT EVENTS / STUDIES:  10/28 lumbar laminectomy L3-L4  10/29 vagal, coffee ground emesis   Assessment/Plan:  Syncopal event 10/29 & 11/2 - DD: Vasovagal versus bradycardia - Patient gives history of prior episodes of syncope/near syncope in the context of GI upset-nausea/vomiting. She states that these are infrequent. - Patient had an episode last night when she was on the toilet for about 10 minutes when she became unresponsive, rapid response called, telemetry showed bradycardia in the 40s, patient spontaneously regained consciousness without need for resuscitation - Telemetry shows bradycardia in the 40s at approximately 1:46 AM with? Sinus arrest and junctional escape rhythm versus Mobitz I heart block. - EKG without acute changes - Cardiology consulted  - Will check orthostatic blood pressures, 2-D echocardiogram and carotid Dopplers.   Nausea/Vomiting - ?coffee ground emesis 10/29 - Patient having intermittent nausea and vomiting. Had 1 episode of nonbloody emesis this morning  - Antiemetics and and monitor.  - Continue PPI. Monitor CBCs.   - Continue liquids for now.  Enterobacter Cloacae UTI/Pyelo Begin empiric tx w/ ceftriaxone - follow culture for speciation and sensitivity   S/p Lumbar laminectomy - L3-L4 Care per primary team Neurosurgery   Hx seizure No evidence of active sz at this time  Chronic hyponatremia Does not need to resume chlorthalidone - Na appears to be stable within her baseline range (127-130) - Baseline  unknown  HTN BP mildly elevated  - Continue lisinopril.   CAD Asymptomatic   Code Status: FULL Family Communication: no family present at time of exam Disposition: Home when medically stable  Antibiotics: ceftriaxone 11/1 >>  DVT prophylaxis: SCDs  HPI/Subjective: Overnight events noted. Patient felt like having a BM and was on the toilet for approximately 10 minutes when she passed out. Rapid response was called and she recovered spontaneously. Patient had a small BM and flatus last night. Had an episode of nonbloody emesis this morning. Complains of lower abdominal discomfort.   Objective: Blood pressure 180/64, pulse 91, temperature 98 F (36.7 C), temperature source Oral, resp. rate 18, height 5' (1.524 m), weight 81.3 kg (179 lb 3.7 oz), SpO2 95.00%. No intake or output data in the 24 hours ending 07/29/13 1203 Exam: General:  elderly female lying supine in bed without any distress.  Lungs: Clear to auscultation bilaterally without wheezes or crackles. No increased work of breathing.  Cardiovascular: Regular rate and rhythm without murmur gallop or rub normal S1 and S2 telemetry: Mostly sinus rhythm. Had sinus bradycardia in the 40s with? Sinus arrest and ventricular escape versus Mobitz 1 heart block at approximately 1:46 AM.  Abdomen:  nondistended, soft and nontender. Normal bowel sounds heard.  Extremities:  symmetric 5 x 5 power. Peripheral pulses symmetrically felt.   CNS: Alert and oriented. No focal neurological deficits. Musculoskeletal system: Lumbar surgical site: Clean and dry.  Data Reviewed: Basic Metabolic Panel:  Recent Labs Lab 07/24/13 0615 07/25/13 0356 07/26/13 0350 07/27/13 0530 07/28/13 0605  NA 128* 123* 127* 130* 127*  K 4.4 4.1 3.9 4.3 3.9  CL  --  85* 90*  92* 88*  CO2  --  28 30 29 30   GLUCOSE 114* 114* 123* 131* 134*  BUN  --  23 12 18 14   CREATININE  --  0.89 0.79 0.81 0.73  CALCIUM  --  8.9 8.7 9.1 8.9   Liver Function Tests: No  results found for this basename: AST, ALT, ALKPHOS, BILITOT, PROT, ALBUMIN,  in the last 168 hours  CBC:  Recent Labs Lab 07/24/13 0615 07/25/13 0356 07/26/13 0350 07/27/13 0530  WBC  --  10.9* 11.2* 11.6*  HGB 10.9* 10.1* 9.6* 9.6*  HCT 32.0* 28.5* 26.9* 27.6*  MCV  --  88.5 89.1 90.2  PLT  --  213 174 190   Cardiac Enzymes:  Recent Labs Lab 07/25/13 1133 07/25/13 1451 07/25/13 2030  TROPONINI <0.30 <0.30 <0.30   Studies:  Recent x-ray studies have been reviewed in detail by the Attending Physician  Scheduled Meds:  Scheduled Meds: . aspirin EC  81 mg Oral Daily  . carbamazepine  200 mg Oral Daily  . cefTRIAXone (ROCEPHIN)  IV  1 g Intravenous Q24H  . docusate sodium  100 mg Oral BID  . lisinopril  10 mg Oral Daily  . pantoprazole  40 mg Oral Q1200  . polyethylene glycol  17 g Oral Daily  . senna  1 tablet Oral BID  . simvastatin  20 mg Oral QHS  . sodium chloride  2 g Oral TID WC  . tamsulosin  0.4 mg Oral Daily    Time spent on care of this patient: 25 mins   HONGALGI,ANAND, MD, FACP, FHM. [facility] Pager [phone]  If 7PM-7AM, please contact night-coverage [email or web address] Password [email or web address] 07/29/2013, 12:18 PM    LOS: 5 days

## 2013-07-29 NOTE — Progress Notes (Signed)
Subjective: Patient had syncopal episode again  Objective: Vital signs in last 24 hours: Temp:  [98 F (36.7 C)-98.6 F (37 C)] 98 F (36.7 C) (11/02 1017) Pulse Rate:  [74-117] 91 (11/02 1017) Resp:  [18-20] 18 (11/02 1017) BP: (127-205)/(44-76) 180/64 mmHg (11/02 1017) SpO2:  [93 %-99 %] 95 % (11/02 1017)  Intake/Output from previous day:   Intake/Output this shift:    no change neuro   Lab Results:  Recent Labs  07/27/13 0530  WBC 11.6*  HGB 9.6*  HCT 27.6*  PLT 190   BMET  Recent Labs  07/27/13 0530 07/28/13 0605  NA 130* 127*  K 4.3 3.9  CL 92* 88*  CO2 29 30  GLUCOSE 131* 134*  BUN 18 14  CREATININE 0.81 0.73  CALCIUM 9.1 8.9    Studies/Results: No results found.  Assessment/Plan: Appreciate medicine input - increase activity as pt tolerates   LOS: 5 days     Pilar Corrales R, MD 07/29/2013, 11:02 AM

## 2013-07-29 NOTE — Consult Note (Addendum)
CARDIOLOGY CONSULT NOTE   Patient ID: Carrie Gillespie MRN: [id number] DOB/AGE: Jan 04, 1920 79 y.o.  Admit Date: 07/24/2013 Primary Physician: Curlene Labrum, MD Primary Cardiologist   Ron Parker   Clinical Summary Carrie Gillespie is a 79 y.o.female. I have followed her cardiac status for many years. She has coronary disease. This has been quite stable. She's not been having any chest pain. She was admitted and had lumbar laminectomy. She's had some GI symptoms since her surgery. She's also had 2 episodes of presyncope. Both of them appear to be related to having GI symptoms. The worst episode was last night while sitting on the toilet for a prolonged period of time. She does relate to me today that she did have abdominal pain. The monitor strip show sinus bradycardia with some very limited junctional escape.   No Known Allergies  Medications Scheduled Medications: . aspirin EC  81 mg Oral Daily  . carbamazepine  200 mg Oral Daily  . cefTRIAXone (ROCEPHIN)  IV  1 g Intravenous Q24H  . docusate sodium  100 mg Oral BID  . lisinopril  10 mg Oral Daily  . pantoprazole  40 mg Oral Q1200  . polyethylene glycol  17 g Oral BID  . senna  1 tablet Oral BID  . simvastatin  20 mg Oral QHS  . sodium chloride  2 g Oral TID WC  . tamsulosin  0.4 mg Oral Daily     Infusions: . sodium chloride Stopped (07/26/13 1100)     PRN Medications:  acetaminophen, acetaminophen, alum & mag hydroxide-simeth, bisacodyl, menthol-cetylpyridinium, methocarbamol (ROBAXIN) IV, methocarbamol, morphine injection, nitroGLYCERIN, ondansetron (ZOFRAN) IV, oxyCODONE-acetaminophen, phenol  Review of systems Patient denies fever, chills, headache, sweats, rash, change in vision, change in hearing, chest pain,. All other systems are reviewed and are negative other than the history of present illness.   Past Medical History  Diagnosis Date  . Peripheral vascular disease, unspecified     Minimal internal carotid artery  disease  . Unspecified essential hypertension   . Other and unspecified hyperlipidemia   . Coronary atherosclerosis of native coronary artery     Status post stent to right coronary 2004, catheterization 2005 nonobstructive CAD, normal LV function, Cardiolite study 2008 negative for ischemia. Residual moderate ostial RCA disease. Stress testing 2012 negative for ischemia ejection fraction 78%  . Carotid artery disease     with an occluded right vertebral artery, otherwise nonobstructive carotid artery disease  . Nerve entrapment syndrome of lower extremity      status post prior back surgery and weakness in left leg  . Ejection fraction     LV function normal by nuclear,  2012  . Arthritis   . Seizures     Past Surgical History  Procedure Laterality Date  . Kidney surgery  2010    1/4 of right kidney removed [facility]  . Cardiac catheterization      nonobstructive coronary artery disease  . Cataract extraction    . Knee arthroplasty Left 2004  . Partial hysterectomy    . Coronary angioplasty  1998-1999    Family History  Problem Relation Age of Onset  . Hypertension Other   . Diabetes Other     Social History Ms. Empson reports that she has never smoked. She has never used smokeless tobacco. Ms. Fauver reports that she does not drink alcohol.  Physical Examination Blood pressure 180/64, pulse 91, temperature 98 F (36.7 C), temperature source Oral, resp. rate 18, height 5' (1.524  m), weight 179 lb 3.7 oz (81.3 kg), SpO2 95.00%. No intake or output data in the 24 hours ending 07/29/13 1338 Patient currently is oriented to person time and place. Affect is normal. There is no jugulovenous distention. Lungs are clear. Respiratory effort is nonlabored. Cardiac exam reveals S1 and S2. There no clicks or significant murmurs. She's wearing a support belt. There is no peripheral edema. There are no skin rashes.  Telemetry: I have reviewed telemetry strips. There sinus rhythm. There is  some limited sinus bradycardia and very limited junctional escape.   Lab Results  Basic Metabolic Panel:  Recent Labs Lab 07/24/13 0615 07/25/13 0356 07/26/13 0350 07/27/13 0530 07/28/13 0605  NA 128* 123* 127* 130* 127*  K 4.4 4.1 3.9 4.3 3.9  CL  --  85* 90* 92* 88*  CO2  --  28 30 29 30   GLUCOSE 114* 114* 123* 131* 134*  BUN  --  23 12 18 14   CREATININE  --  0.89 0.79 0.81 0.73  CALCIUM  --  8.9 8.7 9.1 8.9    Liver Function Tests: No results found for this basename: AST, ALT, ALKPHOS, BILITOT, PROT, ALBUMIN,  in the last 168 hours  CBC:  Recent Labs Lab 07/24/13 0615 07/25/13 0356 07/26/13 0350 07/27/13 0530  WBC  --  10.9* 11.2* 11.6*  HGB 10.9* 10.1* 9.6* 9.6*  HCT 32.0* 28.5* 26.9* 27.6*  MCV  --  88.5 89.1 90.2  PLT  --  213 174 190    Cardiac Enzymes:  Recent Labs Lab 07/25/13 1133 07/25/13 1451 07/25/13 2030  TROPONINI <0.30 <0.30 <0.30    BNP: No components found with this basename: POCBNP,    Radiology: No results found.   ECG:   EKG is normal. There is no acute change.   Impression and Recommendations  Syncope / presyncope    She's had 2 episodes in the hospital. It appears that both episodes occurred while she was having abdominal discomfort. I believe that she is having a vagal reaction. The main thrust of the workup needs to be to stabilize her GI status. In addition she needs to be monitored when she is on the toilet. She will not be stable for discharge until we are sure that all of her GI symptoms are resolved. For now continue to monitor her heart rate but no further workup. Her outpatient beta blocker is on hold.    Coronary atherosclerosis of native coronary artery      Patient had a PCI in the past. Most recent nuclear scan was 2012 with no significant ischemia. There is history of good left ventricular function. There is no evidence of significant ischemia at this time.    Carotid artery disease     Historically she's  had mild carotid disease. Carotid Doppler has been ordered this admission.    Hyponatremia    There has been a history of some hyponatremia in the past. It has not been marked. We need to follow her sodium carefully.      Nausea with vomiting     The patient is having nausea and vomiting as a postoperative problem. I believe her GI symptoms are the major basis of her bradycardia. We will need to continue to assess and treat her GI status carefully.    UTI (urinary tract infection)      This is being treated    Bradycardia     She has had bradycardia when she's had abdominal discomfort. I believe that  it is vagal. There is no indication for a pacemaker. She was on a beta blocker before admission with no significant problems. She is not receiving this now. I certainly agree with holding her beta blocker.    Status post lumbar laminectomy    She is recovering slowly this admission.  Daryel November, MD  07/29/2013, 1:38 PM

## 2013-07-30 ENCOUNTER — Inpatient Hospital Stay (HOSPITAL_COMMUNITY): Payer: Medicare Other

## 2013-07-30 LAB — BASIC METABOLIC PANEL
BUN: 14 mg/dL (ref 6–23)
Chloride: 88 mEq/L — ABNORMAL LOW (ref 96–112)
Creatinine, Ser: 0.69 mg/dL (ref 0.50–1.10)
GFR calc Af Amer: 86 mL/min — ABNORMAL LOW (ref >90)
GFR calc non Af Amer: 74 mL/min — ABNORMAL LOW (ref >90)
Glucose, Bld: 119 mg/dL — ABNORMAL HIGH (ref 70–99)

## 2013-07-30 LAB — GLUCOSE, CAPILLARY: Glucose-Capillary: 151 mg/dL — ABNORMAL HIGH (ref 70–99)

## 2013-07-30 LAB — CBC
HCT: 27.9 % — ABNORMAL LOW (ref 36.0–46.0)
MCV: 89.7 fL (ref 78.0–100.0)
Platelets: 249 10*3/uL (ref 150–400)
RDW: 13.4 % (ref 11.5–15.5)
WBC: 12.8 10*3/uL — ABNORMAL HIGH (ref 4.0–10.5)

## 2013-07-30 MED ORDER — BISACODYL 10 MG RE SUPP
10.0000 mg | Freq: Once | RECTAL | Status: AC
  Administered 2013-07-30: 10 mg via RECTAL
  Filled 2013-07-30: qty 1

## 2013-07-30 MED ORDER — SENNA 8.6 MG PO TABS
2.0000 | ORAL_TABLET | Freq: Two times a day (BID) | ORAL | Status: DC
  Administered 2013-07-30 – 2013-08-01 (×4): 17.2 mg via ORAL
  Filled 2013-07-30 (×5): qty 2

## 2013-07-30 MED ORDER — METOPROLOL SUCCINATE 12.5 MG HALF TABLET
12.5000 mg | ORAL_TABLET | Freq: Every day | ORAL | Status: DC
  Administered 2013-07-30: 12.5 mg via ORAL
  Filled 2013-07-30 (×2): qty 1

## 2013-07-30 NOTE — Progress Notes (Signed)
Physical Therapy Treatment Patient Details Name: Carrie Gillespie MRN: [id number] DOB: 1920/05/16 Today's Date: 07/30/2013 Time: [id number] PT Time Calculation (min): 30 min  PT Assessment / Plan / Recommendation  History of Present Illness 79 yo female with hx seizures, PVD admitted 10/28 for elective lumbar laminectomy.  POD #1 had episode bradycardia with HR 40's, (?vagal) with AMS as well as coffee ground emesis.  Pt quickly recovered after vagal episode with return of mental status, stable BP and HR 90's.  Still c/o nausea, back pain.  Denies chest pain, headache.    PT Comments   Pt progressing towards goals of bed mobility, transfers, and ambulation.  Pt required few cues to maintain precautions throughout, including with bathroom functional activities.  Pt required seated rest break due to R LE pain and fatigue during gait training.  Follow Up Recommendations  SNF     Does the patient have the potential to tolerate intense rehabilitation     Barriers to Discharge        Equipment Recommendations  Rolling walker with 5" wheels    Recommendations for Other Services    Frequency Min 5X/week   Progress towards PT Goals Progress towards PT goals: Progressing toward goals  Plan Current plan remains appropriate    Precautions / Restrictions Precautions Precautions: Back Precaution Booklet Issued: Yes (comment) Precaution Comments: pt able to recall 2/3 precautions Required Braces or Orthoses: Spinal Brace Spinal Brace: Applied in sitting position;Lumbar corset Restrictions Weight Bearing Restrictions: No   Pertinent Vitals/Pain 8/10 R LE pain; pt reported premedication.    Mobility  Bed Mobility Bed Mobility: Rolling Left;Left Sidelying to Sit;Sitting - Scoot to Edge of Bed Rolling Left: 4: Min guard Right Sidelying to Sit: 4: Min guard;With rails;HOB flat Sitting - Scoot to Edge of Bed: 4: Min guard Details for Bed Mobility Assistance: Min guard for safety and vc's for  correct sequence to maintain precautions Transfers Transfers: Sit to Stand;Stand to Sit Sit to Stand: 4: Min guard;With upper extremity assist;4: Min assist;From bed;From chair/3-in-1;Other (comment) (hallway couch) Stand to Sit: 4: Min guard;To chair/3-in-1;Other (comment);With upper extremity assist (hallway couch) Details for Transfer Assistance: Min assist with standing from lower surface from hallway couch. Min guard with other transfers for balance and safety Ambulation/Gait Ambulation/Gait Assistance: 4: Min guard Ambulation Distance (Feet): 200 Feet Assistive device: Rolling walker Ambulation/Gait Assistance Details: Min guard for safety and balance as pt was somewhat unsteady; cues required for upright posture.  Seated rest break required after 100'. Gait Pattern: Step-through pattern;Decreased stride length Gait velocity: decreased    Exercises     PT Diagnosis:    PT Problem List:   PT Treatment Interventions:     PT Goals (current goals can now be found in the care plan section)    Visit Information  Last PT Received On: 07/30/13 Assistance Needed: +1 History of Present Illness: 79 yo female with hx seizures, PVD admitted 10/28 for elective lumbar laminectomy.  POD #1 had episode bradycardia with HR 40's, (?vagal) with AMS as well as coffee ground emesis.  Pt quickly recovered after vagal episode with return of mental status, stable BP and HR 90's.  Still c/o nausea, back pain.  Denies chest pain, headache.     Subjective Data      Cognition  Cognition Arousal/Alertness: Awake/alert Behavior During Therapy: WFL for tasks assessed/performed Overall Cognitive Status: Within Functional Limits for tasks assessed    Balance     End of Session PT - End of  Session Equipment Utilized During Treatment: Gait belt Activity Tolerance: Patient tolerated treatment well;Patient limited by fatigue;Patient limited by pain Patient left: in chair;with call bell/phone within  reach Nurse Communication: Mobility status   GP     Belva Agee, [place] 07/30/2013, 12:27 PM

## 2013-07-30 NOTE — Progress Notes (Signed)
Subjective:  No further syncope. Telemetry now showing sinus tachycardia.  Objective:  Vital Signs in the last 24 hours: Temp:  [98 F (36.7 C)-98.6 F (37 C)] 98.6 F (37 C) (11/03 0508) Pulse Rate:  [90-99] 91 (11/03 0508) Resp:  [16-20] 18 (11/03 0508) BP: (163-180)/(54-70) 166/70 mmHg (11/03 0508) SpO2:  [94 %-97 %] 96 % (11/03 0508)  Intake/Output from previous day:   Intake/Output from this shift:    . aspirin EC  81 mg Oral Daily  . carbamazepine  200 mg Oral Daily  . cefTRIAXone (ROCEPHIN)  IV  1 g Intravenous Q24H  . docusate sodium  100 mg Oral BID  . lisinopril  10 mg Oral Daily  . pantoprazole  40 mg Oral BID  . polyethylene glycol  17 g Oral BID  . senna  1 tablet Oral BID  . simvastatin  20 mg Oral QHS  . sodium chloride  2 g Oral TID WC  . tamsulosin  0.4 mg Oral Daily   . sodium chloride Stopped (07/26/13 1100)    Physical Exam: The patient appears to be in no distress.  Head and neck exam reveals that the pupils are equal and reactive.  The extraocular movements are full.  There is no scleral icterus.  Mouth and pharynx are benign.  No lymphadenopathy.  No carotid bruits.  The jugular venous pressure is normal.  Thyroid is not enlarged or tender.  Chest is clear to percussion and auscultation.  No rales or rhonchi.  Expansion of the chest is symmetrical.  Heart reveals no abnormal lift or heave.  First and second heart sounds are normal.  There is no murmur gallop rub or click.  The abdomen is soft and nontender.  Bowel sounds are normoactive.  There is no hepatosplenomegaly or mass.  There are no abdominal bruits.  Extremities reveal no phlebitis or edema.  Pedal pulses are good.  There is no cyanosis or clubbing.  Neurologic exam is normal strength and no lateralizing weakness.  No sensory deficits.  Integument reveals no rash  Lab Results:  Recent Labs  07/29/13 1353 07/30/13 0635  WBC 14.0* 12.8*  HGB 10.2* 9.5*  PLT 274 249     Recent Labs  07/28/13 0605 07/30/13 0635  NA 127* 127*  K 3.9 3.9  CL 88* 88*  CO2 30 29  GLUCOSE 134* 119*  BUN 14 14  CREATININE 0.73 0.69   No results found for this basename: TROPONINI, CK, MB,  in the last 72 hours Hepatic Function Panel No results found for this basename: PROT, ALBUMIN, AST, ALT, ALKPHOS, BILITOT, BILIDIR, IBILI,  in the last 72 hours No results found for this basename: CHOL,  in the last 72 hours No results found for this basename: PROTIME,  in the last 72 hours  Imaging: No results found.  Cardiac Studies: 2D echo reveals: - Left ventricle: The cavity size was normal. Wall thickness was increased in a pattern of moderate LVH. Systolic function was vigorous. The estimated ejection fraction was in the range of 65% to 70%. Wall motion was normal; there were no regional wall motion abnormalities. - Aortic valve: Sclerosis without stenosis. No significant regurgitation. - Mitral valve: Mildly calcified annulus. - Left atrium: The atrium was mildly dilated. - Inferior vena cava: The vessel was normal in size; the respirophasic diameter changes were in the normal range (= 50%); findings are consistent with normal central venous pressure.  Assessment/Plan:  Syncope probably secondary to vasovagal  reaction. CAD Hyponatremia.   Plan: She had been on 'Toprol 50 mg at home. Now sinus tachy. Will restart Toprol in low dose 12.5 mg daily.  LOS: 6 days    Carrie Gillespie 07/30/2013, 8:23 AM

## 2013-07-30 NOTE — Progress Notes (Signed)
[facility] CONSULT F/U Note    Carrie Gillespie [id number] DOB: Mar 22, 1920 DOA: 07/24/2013 PCP: Curlene Labrum, MD  Admit HPI / Brief Narrative: 79 yo female with hx seizures and PVD admitted 10/28 for elective lumbar laminectomy. POD #1 had episode bradycardia with HR 40's (?vagal) with AMS as well as coffee ground emesis. Pt quickly recovered after episode with return of mental status, stable BP and HR 90's.   SIGNIFICANT EVENTS / STUDIES:  10/28 lumbar laminectomy L3-L4  10/29 vagal, coffee ground emesis   Assessment/Plan:  Syncopal event 10/29 & 11/2 - Likely vasovagal due to GI issues- nausea, constipation, abdominal pain   - Patient gives history of prior episodes of syncope/near syncope in the context of GI upset-nausea/vomiting. She states that these are infrequent. - Patient had an episode night of 11/1 when she was on the toilet for about 10 minutes when she became unresponsive, rapid response called, telemetry showed bradycardia in the 40s, patient spontaneously regained consciousness without need for resuscitation - Telemetry on 11/1 night some bradycardia concerning for conduction abnormalities  - Cardiology consultation and followup appreciated. They indicate that her syncope was most likely vasovagal. - Orthostatic blood pressures negative - Carotid Dopplers: 1-39% ICA stenosis bilaterally. - 2-D echo: Moderate LVH. LVEF 65-70%. - No further workup.  Nausea/Vomiting - ?coffee ground emesis 10/29 - No further nausea or vomiting since 11/2 AM. Tolerating diet. Had small BM last night. Still feels constipated. - Antiemetics and and monitor.  - Continue PPI. Monitor CBCs.   - Advance diet as tolerated. - Bowel regimen. - No further symptoms suggestive of GI bleed.  Enterobacter Cloacae UTI/Pyelo On IV Rocephin day 3 > can change to oral Ceftin 500 mg BID to complete total 7 days.  S/p Lumbar laminectomy - L3-L4 Care per primary team Neurosurgery  Patient  complains of worsening low back pain radiating to right lower extremity since midnight of syncopal event 11/1.  Hx seizure No evidence of active sz at this time  Chronic hyponatremia Does not need to resume chlorthalidone - Na appears to be stable within her baseline range (127-130) - Baseline unknown - Will need periodic followup of BMP at discharge.  HTN BP mildly elevated  - Continue lisinopril. Cardiology starting low dose metoprolol.   CAD Asymptomatic   Code Status: FULL Family Communication: no family present at time of exam Disposition: SNF when cleared by neurosurgery.  Antibiotics: ceftriaxone 11/1 >>  DVT prophylaxis: SCDs  HPI/Subjective: No further nausea or vomiting. Tolerating diet. Had small BM last night. Denies abdominal pain but thinks that she is still constipated. Low back pain radiating to right lower extremity.  Objective: Blood pressure 162/63, pulse 91, temperature 98.8 F (37.1 C), temperature source Oral, resp. rate 18, height 5' (1.524 m), weight 81.3 kg (179 lb 3.7 oz), SpO2 98.00%. No intake or output data in the 24 hours ending 07/30/13 1316 Exam: General:  elderly female sitting on chair in no distress.  Lungs: Clear to auscultation bilaterally without wheezes or crackles. No increased work of breathing.  Cardiovascular: Regular rate and rhythm without murmur gallop or rub normal S1 and S2. Abdomen:  nondistended, soft and nontender. Normal bowel sounds heard.  Extremities:  symmetric 5 x 5 power. Peripheral pulses symmetrically felt.   CNS: Alert and oriented. No focal neurological deficits. Musculoskeletal system: Lumbar surgical site: Clean and dry.  Data Reviewed: Basic Metabolic Panel:  Recent Labs Lab 07/25/13 0356 07/26/13 0350 07/27/13 0530 07/28/13 0605 07/30/13 0635  NA 123*  127* 130* 127* 127*  K 4.1 3.9 4.3 3.9 3.9  CL 85* 90* 92* 88* 88*  CO2 28 30 29 30 29   GLUCOSE 114* 123* 131* 134* 119*  BUN 23 12 18 14 14    CREATININE 0.89 0.79 0.81 0.73 0.69  CALCIUM 8.9 8.7 9.1 8.9 8.9   Liver Function Tests: No results found for this basename: AST, ALT, ALKPHOS, BILITOT, PROT, ALBUMIN,  in the last 168 hours  CBC:  Recent Labs Lab 07/25/13 0356 07/26/13 0350 07/27/13 0530 07/29/13 1353 07/30/13 0635  WBC 10.9* 11.2* 11.6* 14.0* 12.8*  HGB 10.1* 9.6* 9.6* 10.2* 9.5*  HCT 28.5* 26.9* 27.6* 29.1* 27.9*  MCV 88.5 89.1 90.2 89.5 89.7  PLT 213 174 190 274 249   Cardiac Enzymes:  Recent Labs Lab 07/25/13 1133 07/25/13 1451 07/25/13 2030  TROPONINI <0.30 <0.30 <0.30   Studies:  Recent x-ray studies have been reviewed in detail by the Attending Physician  Scheduled Meds:  Scheduled Meds: . aspirin EC  81 mg Oral Daily  . carbamazepine  200 mg Oral Daily  . cefTRIAXone (ROCEPHIN)  IV  1 g Intravenous Q24H  . docusate sodium  100 mg Oral BID  . lisinopril  10 mg Oral Daily  . metoprolol succinate  12.5 mg Oral Daily  . pantoprazole  40 mg Oral BID  . polyethylene glycol  17 g Oral BID  . senna  1 tablet Oral BID  . simvastatin  20 mg Oral QHS  . sodium chloride  2 g Oral TID WC  . tamsulosin  0.4 mg Oral Daily    Time spent on care of this patient: 25 mins   Emmilynn Marut, MD, FACP, FHM. [facility] Pager [phone]  If 7PM-7AM, please contact night-coverage [email or web address] Password [email or web address] 07/30/2013, 1:16 PM    LOS: 6 days

## 2013-07-30 NOTE — Progress Notes (Signed)
CSW spoke to [facility] in [place], who stated that they are able to take patient possibly tomorrow. CSW will update patient and family.  Jeanette Caprice, MSW, [place]

## 2013-07-30 NOTE — Progress Notes (Addendum)
Subjective: Patient reports Episode of hypotension on Saturday. Patient noted return of pain in RLE  Objective: Vital signs in last 24 hours: Temp:  [98.1 F (36.7 C)-98.8 F (37.1 C)] 98.8 F (37.1 C) (11/03 1020) Pulse Rate:  [90-99] 91 (11/03 1020) Resp:  [16-20] 18 (11/03 1020) BP: (162-174)/(54-70) 162/63 mmHg (11/03 1020) SpO2:  [94 %-98 %] 98 % (11/03 1020)  Intake/Output from previous day:   Intake/Output this shift:    Incision is clean and dry motor function is good. Getting along slowly.  Lab Results:  Recent Labs  07/29/13 1353 07/30/13 0635  WBC 14.0* 12.8*  HGB 10.2* 9.5*  HCT 29.1* 27.9*  PLT 274 249   BMET  Recent Labs  07/28/13 0605 07/30/13 0635  NA 127* 127*  K 3.9 3.9  CL 88* 88*  CO2 30 29  GLUCOSE 134* 119*  BUN 14 14  CREATININE 0.73 0.69  CALCIUM 8.9 8.9    Studies/Results: No results found.  Assessment/Plan: Mobilize as tolerated. CT of L spine  LOS: 6 days  May require short-term SNF   Kamariya Blevens J 07/30/2013, 1:20 PM

## 2013-07-30 NOTE — Progress Notes (Signed)
Seen and agreed 07/30/2013 Jacqualyn Posey PTA [phone] pager [phone] office

## 2013-07-30 NOTE — Progress Notes (Signed)
Occupational Therapy Treatment Patient Details Name: Carrie Gillespie MRN: [id number] DOB: 01/28/1920 Today's Date: 07/30/2013 Time: [id number] OT Time Calculation (min): 28 min  OT Assessment / Plan / Recommendation  History of present illness 79 yo female with hx seizures, PVD admitted 10/28 for elective lumbar laminectomy.  POD #1 had episode bradycardia with HR 40's, (?vagal) with AMS as well as coffee ground emesis.  Pt quickly recovered after vagal episode with return of mental status, stable BP and HR 90's.  Still c/o nausea, back pain.  Denies chest pain, headache.    OT comments  Pt progressing towards goals. Practiced donning/doffing brace, bed mobility, toileting, grooming, and AE for LB ADLs. Pt moving well during session. Continue to recommend SNF for continued rehab.  Follow Up Recommendations  SNF    Barriers to Discharge       Equipment Recommendations  3 in 1 bedside comode;Tub/shower bench    Recommendations for Other Services    Frequency Min 2X/week   Progress towards OT Goals Progress towards OT goals: Progressing toward goals  Plan Discharge plan remains appropriate    Precautions / Restrictions Precautions Precautions: Back Precaution Booklet Issued: No Precaution Comments: pt able to recall 2/3 precautions Required Braces or Orthoses: Spinal Brace Spinal Brace: Applied in sitting position;Lumbar corset Restrictions Weight Bearing Restrictions: No   Pertinent Vitals/Pain Pain 10/10. Nurse notified.     ADL  Grooming: Wash/dry hands;Supervision/safety Where Assessed - Grooming: Supported standing Upper Body Dressing: Minimal assistance (back brace) Where Assessed - Upper Body Dressing: Unsupported sitting Lower Body Dressing: Min guard Where Assessed - Lower Body Dressing: Supported sit to Lobbyist: Magazine features editor Method: Sit to Loss adjuster, chartered: Raised toilet seat with arms (or 3-in-1 over toilet) Toileting -  Clothing Manipulation and Hygiene: Supervision/safety Where Assessed - Best boy and Hygiene: Lean right and/or left Equipment Used: Back brace;Gait belt;Reacher;Long-handled sponge;Long-handled shoe horn;Rolling walker;Sock aid Transfers/Ambulation Related to ADLs: Min guard for ambulation and transfers. ADL Comments: Practiced donning panties with reacher and donned/doffed sock with sockaid/reacher-OT educated. Pt ambulated to bathroom and performed toileting tasks-OT educated on use of toilet aid for hygiene if she is unable to reach behind for hygiene after BM. Practiced donning/doffing brace-Min A.  Educated on use of two cups for teeth care and also setting grooming items on right side of sink to avoid breaking precautions.    OT Diagnosis:    OT Problem List:   OT Treatment Interventions:     OT Goals(current goals can now be found in the care plan section) Acute Rehab OT Goals Patient Stated Goal: not stated OT Goal Formulation: With patient Time For Goal Achievement: 08/09/13 Potential to Achieve Goals: Good ADL Goals Pt Will Perform Lower Body Bathing: with min assist;with adaptive equipment;sit to/from stand Pt Will Perform Lower Body Dressing: with min assist;with adaptive equipment;sit to/from stand Pt Will Transfer to Toilet: with min assist;ambulating;bedside commode Pt Will Perform Toileting - Clothing Manipulation and hygiene: with min assist;sit to/from stand;with adaptive equipment Additional ADL Goal #1: Pt will verbalize 3/3 back precautions independently Additional ADL Goal #2: Pt will don/doff back brace with setup A while maintaining back precautions.   Visit Information  Last OT Received On: 07/30/13 Assistance Needed: +1 History of Present Illness: 79 yo female with hx seizures, PVD admitted 10/28 for elective lumbar laminectomy.  POD #1 had episode bradycardia with HR 40's, (?vagal) with AMS as well as coffee ground emesis.  Pt quickly  recovered after  vagal episode with return of mental status, stable BP and HR 90's.  Still c/o nausea, back pain.  Denies chest pain, headache.     Subjective Data      Prior Functioning       Cognition  Cognition Arousal/Alertness: Awake/alert Behavior During Therapy: WFL for tasks assessed/performed Overall Cognitive Status: Within Functional Limits for tasks assessed    Mobility  Bed Mobility Bed Mobility: Sit to Sidelying Left;Rolling Right Rolling Right: 4: Min guard Sit to Sidelying Left: 4: Min guard Details for Bed Mobility Assistance: Cues for precautions.  Transfers Transfers: Sit to Stand;Stand to Sit Sit to Stand: 4: Min guard;With upper extremity assist;From chair/3-in-1 Stand to Sit: 4: Min guard;To bed;To chair/3-in-1 Details for Transfer Assistance: cues to bring walker with her when going to sit. Cues for hand placement for stand to sit transfer to 3 in 1.   Exercises      Balance     End of Session OT - End of Session Equipment Utilized During Treatment: Gait belt;Rolling walker;Back brace Activity Tolerance: Patient tolerated treatment well Patient left: in bed;with call bell/phone within reach (nursing students/instructor) Nurse Communication: Other (comment) (pain level)  GO     Benito Mccreedy OTR/L [id number] 07/30/2013, 1:40 PM

## 2013-07-30 NOTE — Progress Notes (Signed)
Pt given scheduled stool softeners and miralax last night as well as a dulcolax suppository. Bowel sounds are more active than yesterday; pt still c/o abdominal discomfort. Small BM resulted. Pt sts still uncomfortable in abdomen. No c/o nausea this shift. Will continue to monitor.

## 2013-07-31 MED ORDER — GABAPENTIN 100 MG PO CAPS
100.0000 mg | ORAL_CAPSULE | Freq: Three times a day (TID) | ORAL | Status: DC
  Administered 2013-07-31 – 2013-08-01 (×4): 100 mg via ORAL
  Filled 2013-07-31 (×7): qty 1

## 2013-07-31 MED ORDER — SULFAMETHOXAZOLE-TMP DS 800-160 MG PO TABS
1.0000 | ORAL_TABLET | Freq: Two times a day (BID) | ORAL | Status: DC
  Administered 2013-07-31 – 2013-08-01 (×3): 1 via ORAL
  Filled 2013-07-31 (×4): qty 1

## 2013-07-31 MED ORDER — DEXAMETHASONE SODIUM PHOSPHATE 4 MG/ML IJ SOLN
4.0000 mg | Freq: Three times a day (TID) | INTRAMUSCULAR | Status: AC
  Administered 2013-07-31 (×2): 4 mg via INTRAVENOUS
  Filled 2013-07-31 (×2): qty 1

## 2013-07-31 MED ORDER — METOPROLOL SUCCINATE ER 50 MG PO TB24
50.0000 mg | ORAL_TABLET | Freq: Every day | ORAL | Status: DC
  Administered 2013-08-01: 50 mg via ORAL
  Filled 2013-07-31: qty 1

## 2013-07-31 MED ORDER — DEXAMETHASONE 4 MG PO TABS: 4.0000 mg | ORAL_TABLET | Freq: Three times a day (TID) | ORAL | Status: DC

## 2013-07-31 MED ORDER — ISOSORBIDE MONONITRATE ER 60 MG PO TB24
120.0000 mg | ORAL_TABLET | Freq: Every day | ORAL | Status: DC
  Administered 2013-07-31 – 2013-08-01 (×2): 120 mg via ORAL
  Filled 2013-07-31 (×2): qty 2

## 2013-07-31 MED ORDER — ISOSORBIDE MONONITRATE ER 60 MG PO TB24: 120.0000 mg | ORAL_TABLET | Freq: Every day | ORAL | Status: DC

## 2013-07-31 MED ORDER — DEXAMETHASONE 4 MG PO TABS
4.0000 mg | ORAL_TABLET | Freq: Three times a day (TID) | ORAL | Status: AC
  Administered 2013-07-31: 4 mg via ORAL
  Filled 2013-07-31 (×3): qty 1

## 2013-07-31 MED ORDER — METOPROLOL SUCCINATE ER 25 MG PO TB24
25.0000 mg | ORAL_TABLET | Freq: Every day | ORAL | Status: DC
  Filled 2013-07-31: qty 1

## 2013-07-31 NOTE — Progress Notes (Signed)
Physical Therapy Treatment Patient Details Name: Carrie Gillespie MRN: [id number] DOB: March 06, 1920 Today's Date: 07/31/2013 Time: [id number] PT Time Calculation (min): 16 min  PT Assessment / Plan / Recommendation  History of Present Illness 79 yo female with hx seizures, PVD admitted 10/28 for elective lumbar laminectomy.  POD #1 had episode bradycardia with HR 40's, (?vagal) with AMS as well as coffee ground emesis.  Pt quickly recovered after vagal episode with return of mental status, stable BP and HR 90's.  Still c/o nausea, back pain.  Denies chest pain, headache.    PT Comments   Patient demonstrates improvements in activity tolerance and ambulation today. Able to recall 3/3 precautions but requires cues for complying during activity. Will continue to see and progress as tolerated.  Follow Up Recommendations  SNF           Equipment Recommendations  Rolling walker with 5" wheels       Frequency Min 5X/week   Progress towards PT Goals Progress towards PT goals: Progressing toward goals  Plan Current plan remains appropriate    Precautions / Restrictions Precautions Precautions: Back Precaution Booklet Issued: No Precaution Comments: pt able to recall 2/3 precautions Required Braces or Orthoses: Spinal Brace Spinal Brace: Applied in sitting position;Lumbar corset Restrictions Weight Bearing Restrictions: No   Pertinent Vitals/Pain 9/10 pain    Mobility  Bed Mobility Bed Mobility: Not assessed Details for Bed Mobility Assistance: pt received on toilet Transfers Transfers: Sit to Stand;Stand to Sit Sit to Stand: 4: Min guard;With upper extremity assist;From toilet Stand to Sit: 4: Min guard;To bed;To chair/3-in-1 Details for Transfer Assistance: VCs for upright posture, cues for proper positioning with turns to maintain compliance without twisting Ambulation/Gait Ambulation/Gait Assistance: 4: Min guard Ambulation Distance (Feet): 410 Feet Assistive device: Rolling  walker Ambulation/Gait Assistance Details: Continues to require Min guard for safety and balance as pt was somewhat unsteady; cues required for upright posture Gait Pattern: Step-through pattern;Decreased stride length Gait velocity: decreased Stairs: No      PT Goals (current goals can now be found in the care plan section) Acute Rehab PT Goals Patient Stated Goal: not stated PT Goal Formulation: With patient Time For Goal Achievement: 08/02/13 Potential to Achieve Goals: Good  Visit Information  Last PT Received On: 07/31/13 Assistance Needed: +1 History of Present Illness: 79 yo female with hx seizures, PVD admitted 10/28 for elective lumbar laminectomy.  POD #1 had episode bradycardia with HR 40's, (?vagal) with AMS as well as coffee ground emesis.  Pt quickly recovered after vagal episode with return of mental status, stable BP and HR 90's.  Still c/o nausea, back pain.  Denies chest pain, headache.     Subjective Data  Subjective: I had a hard time in that bed last night Patient Stated Goal: not stated   Cognition  Cognition Arousal/Alertness: Awake/alert Behavior During Therapy: WFL for tasks assessed/performed Overall Cognitive Status: Within Functional Limits for tasks assessed    Balance  Balance Balance Assessed: Yes Static Sitting Balance Static Sitting - Balance Support: Feet supported Static Sitting - Level of Assistance: 5: Stand by assistance Static Sitting - Comment/# of Minutes: counter height activities  End of Session PT - End of Session Equipment Utilized During Treatment: Gait belt Activity Tolerance: Patient tolerated treatment well;Patient limited by fatigue;Patient limited by pain Patient left: in chair;with call bell/phone within reach Nurse Communication: Mobility status   GP     Duncan Dull 07/31/2013, 9:14 AM Alben Deeds, PT DPT  [phone]

## 2013-07-31 NOTE — Progress Notes (Signed)
Patient ID: Carrie Gillespie, female   DOB: 1920/03/02, 79 y.o.   MRN: [id number]    SUBJECTIVE:  Patient looks good today. She finally had some good bowel movements and feels much better. She has not had any significant recurrent bradycardia or presyncopal symptoms. Low-dose beta-blockade was restarted yesterday for mild sinus tachycardia.   Filed Vitals:   07/31/13 0610 07/31/13 0702 07/31/13 0703 07/31/13 0704  BP: 163/73 170/78 167/82 141/113  Pulse: 82 77 90 94  Temp: 98.4 F (36.9 C) 98.1 F (36.7 C)    TempSrc: Oral Oral    Resp: 16 18    Height:      Weight:      SpO2: 94% 94%       Intake/Output Summary (Last 24 hours) at 07/31/13 0747 Last data filed at 07/30/13 2152  Gross per 24 hour  Intake    180 ml  Output      0 ml  Net    180 ml    LABS: Basic Metabolic Panel:  Recent Labs  07/30/13 0635  NA 127*  K 3.9  CL 88*  CO2 29  GLUCOSE 119*  BUN 14  CREATININE 0.69  CALCIUM 8.9   Liver Function Tests: No results found for this basename: AST, ALT, ALKPHOS, BILITOT, PROT, ALBUMIN,  in the last 72 hours No results found for this basename: LIPASE, AMYLASE,  in the last 72 hours CBC:  Recent Labs  07/29/13 1353 07/30/13 0635  WBC 14.0* 12.8*  HGB 10.2* 9.5*  HCT 29.1* 27.9*  MCV 89.5 89.7  PLT 274 249   Cardiac Enzymes: No results found for this basename: CKTOTAL, CKMB, CKMBINDEX, TROPONINI,  in the last 72 hours BNP: No components found with this basename: POCBNP,  D-Dimer: No results found for this basename: DDIMER,  in the last 72 hours Hemoglobin A1C: No results found for this basename: HGBA1C,  in the last 72 hours Fasting Lipid Panel: No results found for this basename: CHOL, HDL, LDLCALC, TRIG, CHOLHDL, LDLDIRECT,  in the last 72 hours Thyroid Function Tests: No results found for this basename: TSH, T4TOTAL, FREET3, T3FREE, THYROIDAB,  in the last 72 hours  RADIOLOGY: Dg Chest 2 View  07/16/2013   CLINICAL DATA:  Preop, hypertension   EXAM: CHEST  2 VIEW  COMPARISON:  12/19/2012  FINDINGS: Cardiomediastinal silhouette is stable. No acute infiltrate or pleural effusion. No pulmonary edema. Stable osteopenia and degenerative changes thoracic spine. Atherosclerotic calcifications of thoracic aorta again noted.  IMPRESSION: No active cardiopulmonary disease.   Electronically Signed   By: Lahoma Crocker M.D.   On: 07/16/2013 11:16   Dg Lumbar Spine 2-3 Views  07/24/2013   CLINICAL DATA:  Lumbar spine surgery.  EXAM: LUMBAR SPINE - 2-3 VIEW; DG C-ARM 1-60 MIN  COMPARISON:  06/18/2013  FINDINGS: Three intraoperative views were obtained. Patient has new bilateral pedicle screws at L3. Patient now has bilateral pedicle screws with rod fixation at L3, L4 and L5. Interbody devices at L3-L4 and L4-L5.  IMPRESSION: New posterior lumbar interbody fusion at L3-L4.   Electronically Signed   By: Markus Daft M.D.   On: 07/24/2013 13:20   Ct Lumbar Spine Wo Contrast  07/30/2013   CLINICAL DATA:  New right lower extremity pain. Status post lumbar surgery 1 week ago.  EXAM: CT LUMBAR SPINE WITHOUT CONTRAST  TECHNIQUE: Multidetector CT imaging of the lumbar spine was performed without intravenous contrast administration. Multiplanar CT image reconstructions were also generated.  COMPARISON:  MRI  of the lumbar spine 06/18/2013. Intraoperative radiographs 07/24/2013.  FINDINGS: The patient is status post extension of a lumbar fusion the included the L3-4 disc level. The patient underwent PLIF at L3-4.  The right pedicle screw at L3 extends to the medial more cortex without reaching the cortex. The left pedicle screw is contained within the bone. The L4 and L5 pedicle screws are unremarkable.  Degenerative changes are noted at the SI joints bilaterally.  Previous fusion at L4-5 is mature.  Vertebral body heights and alignment are maintained. Slight rightward curvature of the lumbar spine persists, centered at L3. There is a vacuum disc at L1-2.  The soft tissue windows  demonstrate mild atherosclerotic changes in the and abdominal aorta and branch vessels without aneurysm. A cystic lesion is present in the left kidney. There is some stranding about the left kidney.  Mild disc disease at T12-L1 and L1-2 is unchanged. Facet hypertrophy is stable at these levels.  L2-3 disc herniation is present. Mild facet hypertrophy is noted bilaterally. Mild central and foraminal narrowing is stable.  L3-4: The patient is status post wide laminectomy bilaterally. Bilateral foramina automated were performed. No residual or recurrent stenosis is evident.  L4-5:  Prior fusion at this level is stable.  L5-S1: A shallow central disc protrusion is present without significant stenosis.  IMPRESSION: 1. Postoperative changes at L3-4 without evidence for the significant stenosis or hardware complication. 2. Stable fusion at L4-5. 3. Stable mild disk disease at T12-L1, L1-2, and L2-3.   Electronically Signed   By: Lawrence Santiago M.D.   On: 07/30/2013 17:14   Dg Chest Port 1 View  07/26/2013   CLINICAL DATA:  Atelectasis and status post lumbar surgery.  EXAM: PORTABLE CHEST - 1 VIEW  COMPARISON:  07/16/2013  FINDINGS: Stable chronic lung disease. No airspace consolidation or significant atelectasis is identified. The heart size and mediastinal contours are within normal limits. No pleural effusions or pneumothorax identified.  IMPRESSION: Stable chronic lung disease.   Electronically Signed   By: Aletta Edouard M.D.   On: 07/26/2013 07:49   Dg C-arm 1-60 Min  07/24/2013   CLINICAL DATA:  Lumbar spine surgery.  EXAM: LUMBAR SPINE - 2-3 VIEW; DG C-ARM 1-60 MIN  COMPARISON:  06/18/2013  FINDINGS: Three intraoperative views were obtained. Patient has new bilateral pedicle screws at L3. Patient now has bilateral pedicle screws with rod fixation at L3, L4 and L5. Interbody devices at L3-L4 and L4-L5.  IMPRESSION: New posterior lumbar interbody fusion at L3-L4.   Electronically Signed   By: Markus Daft M.D.    On: 07/24/2013 13:20    PHYSICAL EXAM  patient looks good. She is oriented to person time and place. Affect is normal. There is no jugulovenous distention. Lungs are clear. Respiratory effort is nonlabored. Cardiac exam reveals S1 and S2. There is no peripheral edema.   TELEMETRY: I have reviewed telemetry today July 31, 2013. There is sinus rhythm.   ASSESSMENT AND PLAN:     Coronary atherosclerosis of native coronary artery   Carotid artery disease   Hyponatremia    Syncope     The patient has not had any recurrent syncope or presyncope. These episodes in the hospital related to bradycardia and vasovagal response to her GI symptoms.    Nausea with vomiting     This is improved.    UTI (urinary tract infection)    Bradycardia     Heart rate is stable. She had tachycardia yesterday and her  beta blocker was started at a low dose. I will increase Lopressor to 25 mg daily. The patient does take 50 mg daily at home. If the patient is ready to go home today, she can be sent on 25 mg daily. She can be seen back in my office 1-2 weeks after her hospitalization.    Status post lumbar laminectomy   Dola Argyle 07/31/2013 7:47 AM

## 2013-07-31 NOTE — Progress Notes (Signed)
Subjective: Patient reports "I was doing so well...but my leg is hurting so...just in my thigh"  Objective: Vital signs in last 24 hours: Temp:  [97.8 F (36.6 C)-98.8 F (37.1 C)] 98.1 F (36.7 C) (11/04 0702) Pulse Rate:  [77-94] 94 (11/04 0704) Resp:  [16-20] 18 (11/04 0702) BP: (141-170)/(51-113) 141/113 mmHg (11/04 0704) SpO2:  [94 %-98 %] 94 % (11/04 0702)  Intake/Output from previous day: 11/03 0701 - 11/04 0700 In: 180 [P.O.:180] Out: -  Intake/Output this shift:    Alert, conversant, sitting in chair. Good strength BLE. Incision with intact drsg, without erythema, swelling, or drainage. Pt reports no further episodes or problems other than return of significant right leg pain after apparent syncopal episode.  Lab Results:  Recent Labs  07/29/13 1353 07/30/13 0635  WBC 14.0* 12.8*  HGB 10.2* 9.5*  HCT 29.1* 27.9*  PLT 274 249   BMET  Recent Labs  07/30/13 0635  NA 127*  K 3.9  CL 88*  CO2 29  GLUCOSE 119*  BUN 14  CREATININE 0.69  CALCIUM 8.9    Studies/Results: Ct Lumbar Spine Wo Contrast  07/30/2013   CLINICAL DATA:  New right lower extremity pain. Status post lumbar surgery 1 week ago.  EXAM: CT LUMBAR SPINE WITHOUT CONTRAST  TECHNIQUE: Multidetector CT imaging of the lumbar spine was performed without intravenous contrast administration. Multiplanar CT image reconstructions were also generated.  COMPARISON:  MRI of the lumbar spine 06/18/2013. Intraoperative radiographs 07/24/2013.  FINDINGS: The patient is status post extension of a lumbar fusion the included the L3-4 disc level. The patient underwent PLIF at L3-4.  The right pedicle screw at L3 extends to the medial more cortex without reaching the cortex. The left pedicle screw is contained within the bone. The L4 and L5 pedicle screws are unremarkable.  Degenerative changes are noted at the SI joints bilaterally.  Previous fusion at L4-5 is mature.  Vertebral body heights and alignment are  maintained. Slight rightward curvature of the lumbar spine persists, centered at L3. There is a vacuum disc at L1-2.  The soft tissue windows demonstrate mild atherosclerotic changes in the and abdominal aorta and branch vessels without aneurysm. A cystic lesion is present in the left kidney. There is some stranding about the left kidney.  Mild disc disease at T12-L1 and L1-2 is unchanged. Facet hypertrophy is stable at these levels.  L2-3 disc herniation is present. Mild facet hypertrophy is noted bilaterally. Mild central and foraminal narrowing is stable.  L3-4: The patient is status post wide laminectomy bilaterally. Bilateral foramina automated were performed. No residual or recurrent stenosis is evident.  L4-5:  Prior fusion at this level is stable.  L5-S1: A shallow central disc protrusion is present without significant stenosis.  IMPRESSION: 1. Postoperative changes at L3-4 without evidence for the significant stenosis or hardware complication. 2. Stable fusion at L4-5. 3. Stable mild disk disease at T12-L1, L1-2, and L2-3.   Electronically Signed   By: Lawrence Santiago M.D.   On: 07/30/2013 17:14    Assessment/Plan: Improving, significant pain persists   LOS: 7 days  Per Dr. Vertell Limber, CT reviewed: no concern for bony or hardware fractures; order rec'd & entered into Epic for Decadron 4mg  PO or IV q8hrs x3doses & begin Neurontin 100mg  TID po. Pt should stay today & possibly d/c tomorrow if pain controlled.   Verdis Prime 07/31/2013, 8:04 AM

## 2013-07-31 NOTE — Progress Notes (Signed)
CSW spoke to [facility] in [place], who states they can take patient when she is medically ready.  Jeanette Caprice, MSW, [place]

## 2013-07-31 NOTE — Progress Notes (Signed)
[facility] CONSULT F/U Note    Carrie Gillespie [id number] DOB: 10/17/1919 DOA: 07/24/2013 PCP: Curlene Labrum, MD   Hospitalist service will sign of on 07/31/2013. Please call us for any further assistance.  Admit HPI / Brief Narrative: 79 yo female with hx seizures and PVD admitted 10/28 for elective lumbar laminectomy. POD #1 had episode bradycardia with HR 40's (?vagal) with AMS as well as coffee ground emesis. Pt quickly recovered after episode with return of mental status, stable BP and HR 90's.   SIGNIFICANT EVENTS / STUDIES:  10/28 lumbar laminectomy L3-L4  10/29 vagal, coffee ground emesis   Assessment/Plan:  Syncopal event 10/29 & 11/2 - Likely vasovagal due to GI issues- nausea, constipation, abdominal pain   - Patient gives history of prior episodes of syncope/near syncope in the context of GI upset-nausea/vomiting. She states that these are infrequent. - Patient had an episode night of 11/1 when she was on the toilet for about 10 minutes when she became unresponsive, rapid response called, telemetry showed bradycardia in the 40s, patient spontaneously regained consciousness without need for resuscitation - Telemetry on 11/1 night some bradycardia concerning for conduction abnormalities  - Cardiology consultation and followup appreciated. They indicate that her syncope was most likely vasovagal. - Orthostatic blood pressures negative - Carotid Dopplers: 1-39% ICA stenosis bilaterally. - 2-D echo: Moderate LVH. LVEF 65-70%. - No further workup. No further episodes. - DC Telemetry  Nausea/Vomiting - ?coffee ground emesis 10/29 - No further nausea or vomiting since 11/2 AM. No further coffee ground emesis. - Continue PPI.  - Advance diet heart healthy - Continue Bowel regimen.  Enterobacter Cloacae UTI/Pyelo On IV Rocephin day 4 > can change to oral Bactrim and complete total 10 days (DC after 08/06/13 doses). She probably had a Foley cath earlier on during  surgery.  S/p Lumbar laminectomy - L3-L4 Care per primary team Neurosurgery  Patient complains of worsening low back pain radiating to right lower extremity since midnight of syncopal event 11/1. - Repeat CT L-spine without acute findings  Hx seizure No evidence of active sz at this time  Chronic hyponatremia Does not need to resume chlorthalidone - Na appears to be stable within her baseline range (127-130) - Baseline unknown - Will need periodic followup of BMP at discharge-next one probably in a week from discharge.  HTN BP mildly elevated  - Continue lisinopril. Cardiology starting low dose metoprolol - will increase to 50 mg daily -previous home dose.   CAD Asymptomatic   Code Status: FULL Family Communication: no family present at time of exam Disposition: SNF when cleared by neurosurgery.  Antibiotics: ceftriaxone 11/1 >>  DVT prophylaxis: SCDs  HPI/Subjective: Continues to complain of low back pain. No nausea or vomiting. Tolerating diet. Had 2 normal BMs last night. Denies abdominal pain.  Objective: Blood pressure 150/50, pulse 76, temperature 98 F (36.7 C), temperature source Oral, resp. rate 18, height 5' (1.524 m), weight 81.3 kg (179 lb 3.7 oz), SpO2 94.00%.  Intake/Output Summary (Last 24 hours) at 07/31/13 1054 Last data filed at 07/31/13 0839  Gross per 24 hour  Intake    420 ml  Output      0 ml  Net    420 ml   Exam: General:  elderly female sitting on chair in no distress.  Lungs: Clear to auscultation bilaterally without wheezes or crackles. No increased work of breathing.  Cardiovascular: Regular rate and rhythm without murmur gallop or rub normal S1 and S2. Telemetry: Sinus  rhythm Abdomen:  nondistended, soft and nontender. Normal bowel sounds heard.  Extremities:  symmetric 5 x 5 power. Peripheral pulses symmetrically felt.   CNS: Alert and oriented. No focal neurological deficits. Musculoskeletal system: Lumbar surgical site: Clean and  dry.  Data Reviewed: Basic Metabolic Panel:  Recent Labs Lab 07/25/13 0356 07/26/13 0350 07/27/13 0530 07/28/13 0605 07/30/13 0635  NA 123* 127* 130* 127* 127*  K 4.1 3.9 4.3 3.9 3.9  CL 85* 90* 92* 88* 88*  CO2 28 30 29 30 29   GLUCOSE 114* 123* 131* 134* 119*  BUN 23 12 18 14 14   CREATININE 0.89 0.79 0.81 0.73 0.69  CALCIUM 8.9 8.7 9.1 8.9 8.9   Liver Function Tests: No results found for this basename: AST, ALT, ALKPHOS, BILITOT, PROT, ALBUMIN,  in the last 168 hours  CBC:  Recent Labs Lab 07/25/13 0356 07/26/13 0350 07/27/13 0530 07/29/13 1353 07/30/13 0635  WBC 10.9* 11.2* 11.6* 14.0* 12.8*  HGB 10.1* 9.6* 9.6* 10.2* 9.5*  HCT 28.5* 26.9* 27.6* 29.1* 27.9*  MCV 88.5 89.1 90.2 89.5 89.7  PLT 213 174 190 274 249   Cardiac Enzymes:  Recent Labs Lab 07/25/13 1133 07/25/13 1451 07/25/13 2030  TROPONINI <0.30 <0.30 <0.30   Studies:  Recent x-ray studies have been reviewed in detail by the Attending Physician  Scheduled Meds:  Scheduled Meds: . aspirin EC  81 mg Oral Daily  . carbamazepine  200 mg Oral Daily  . cefTRIAXone (ROCEPHIN)  IV  1 g Intravenous Q24H  . dexamethasone  4 mg Oral Q8H   Or  . dexamethasone  4 mg Intravenous Q8H  . docusate sodium  100 mg Oral BID  . gabapentin  100 mg Oral TID  . lisinopril  10 mg Oral Daily  . metoprolol succinate  25 mg Oral Daily  . pantoprazole  40 mg Oral BID  . polyethylene glycol  17 g Oral BID  . senna  2 tablet Oral BID  . simvastatin  20 mg Oral QHS  . sodium chloride  2 g Oral TID WC  . tamsulosin  0.4 mg Oral Daily    Time spent on care of this patient: 25 mins   Lova Urbieta, MD, FACP, FHM. [facility] Pager [phone]  If 7PM-7AM, please contact night-coverage [email or web address] Password [email or web address] 07/31/2013, 10:54 AM    LOS: 7 days

## 2013-08-01 DIAGNOSIS — R55 Syncope and collapse: Secondary | ICD-10-CM | POA: Diagnosis not present

## 2013-08-01 DIAGNOSIS — R569 Unspecified convulsions: Secondary | ICD-10-CM | POA: Diagnosis not present

## 2013-08-01 DIAGNOSIS — M48061 Spinal stenosis, lumbar region without neurogenic claudication: Secondary | ICD-10-CM | POA: Diagnosis not present

## 2013-08-01 DIAGNOSIS — K219 Gastro-esophageal reflux disease without esophagitis: Secondary | ICD-10-CM | POA: Diagnosis not present

## 2013-08-01 DIAGNOSIS — M6281 Muscle weakness (generalized): Secondary | ICD-10-CM | POA: Diagnosis not present

## 2013-08-01 DIAGNOSIS — E876 Hypokalemia: Secondary | ICD-10-CM | POA: Diagnosis not present

## 2013-08-01 DIAGNOSIS — I739 Peripheral vascular disease, unspecified: Secondary | ICD-10-CM | POA: Diagnosis not present

## 2013-08-01 DIAGNOSIS — I498 Other specified cardiac arrhythmias: Secondary | ICD-10-CM | POA: Diagnosis not present

## 2013-08-01 DIAGNOSIS — E871 Hypo-osmolality and hyponatremia: Secondary | ICD-10-CM | POA: Diagnosis not present

## 2013-08-01 DIAGNOSIS — Z4789 Encounter for other orthopedic aftercare: Secondary | ICD-10-CM | POA: Diagnosis not present

## 2013-08-01 DIAGNOSIS — I10 Essential (primary) hypertension: Secondary | ICD-10-CM | POA: Diagnosis not present

## 2013-08-01 DIAGNOSIS — R059 Cough, unspecified: Secondary | ICD-10-CM | POA: Diagnosis not present

## 2013-08-01 DIAGNOSIS — I959 Hypotension, unspecified: Secondary | ICD-10-CM | POA: Diagnosis not present

## 2013-08-01 DIAGNOSIS — E785 Hyperlipidemia, unspecified: Secondary | ICD-10-CM | POA: Diagnosis not present

## 2013-08-01 DIAGNOSIS — M545 Low back pain, unspecified: Secondary | ICD-10-CM | POA: Diagnosis not present

## 2013-08-01 DIAGNOSIS — N390 Urinary tract infection, site not specified: Secondary | ICD-10-CM | POA: Diagnosis not present

## 2013-08-01 DIAGNOSIS — M159 Polyosteoarthritis, unspecified: Secondary | ICD-10-CM | POA: Diagnosis not present

## 2013-08-01 DIAGNOSIS — H409 Unspecified glaucoma: Secondary | ICD-10-CM | POA: Diagnosis not present

## 2013-08-01 DIAGNOSIS — I2510 Atherosclerotic heart disease of native coronary artery without angina pectoris: Secondary | ICD-10-CM | POA: Diagnosis not present

## 2013-08-01 DIAGNOSIS — D62 Acute posthemorrhagic anemia: Secondary | ICD-10-CM | POA: Diagnosis not present

## 2013-08-01 DIAGNOSIS — R5381 Other malaise: Secondary | ICD-10-CM | POA: Diagnosis not present

## 2013-08-01 DIAGNOSIS — R262 Difficulty in walking, not elsewhere classified: Secondary | ICD-10-CM | POA: Diagnosis not present

## 2013-08-01 DIAGNOSIS — I119 Hypertensive heart disease without heart failure: Secondary | ICD-10-CM | POA: Diagnosis not present

## 2013-08-01 MED ORDER — METHOCARBAMOL 500 MG PO TABS: 500.0000 mg | ORAL_TABLET | Freq: Four times a day (QID) | ORAL | Status: DC | PRN

## 2013-08-01 MED ORDER — OXYCODONE-ACETAMINOPHEN 5-325 MG PO TABS: 1.0000 | ORAL_TABLET | ORAL | Status: DC | PRN

## 2013-08-01 NOTE — Progress Notes (Signed)
Pt. DC to SNF via car with family.  Package given to patient to give at SNF.  Pt. Assessment stable.  Report given to RN at [facility].

## 2013-08-01 NOTE — Progress Notes (Signed)
Subjective:  The patient feels well today.  No cardiac complaints.  No chest pain or shortness of breath.  No further dizziness or presyncopal spells.  She is tolerating resumption of beta blocker Toprol 50 mg daily.  Objective:  Vital Signs in the last 24 hours: Temp:  [98 F (36.7 C)-98.5 F (36.9 C)] 98.2 F (36.8 C) (11/05 0718) Pulse Rate:  [76-99] 82 (11/05 0718) Resp:  [18] 18 (11/05 0718) BP: (118-160)/(50-76) 160/74 mmHg (11/05 0718) SpO2:  [94 %-98 %] 98 % (11/05 0718)  Intake/Output from previous day: 11/04 0701 - 11/05 0700 In: 960 [P.O.:960] Out: -  Intake/Output from this shift:    . aspirin EC  81 mg Oral Daily  . carbamazepine  200 mg Oral Daily  . docusate sodium  100 mg Oral BID  . gabapentin  100 mg Oral TID  . isosorbide mononitrate  120 mg Oral Daily  . lisinopril  10 mg Oral Daily  . metoprolol succinate  50 mg Oral Daily  . pantoprazole  40 mg Oral BID  . polyethylene glycol  17 g Oral BID  . senna  2 tablet Oral BID  . simvastatin  20 mg Oral QHS  . sodium chloride  2 g Oral TID WC  . sulfamethoxazole-trimethoprim  1 tablet Oral Q12H  . tamsulosin  0.4 mg Oral Daily      Physical Exam: The patient appears to be in no distress.  Head and neck exam reveals that the pupils are equal and reactive.  The extraocular movements are full.  There is no scleral icterus.  Mouth and pharynx are benign.  No lymphadenopathy.  No carotid bruits.  The jugular venous pressure is normal.  Thyroid is not enlarged or tender.  Chest is clear to percussion and auscultation.  No rales or rhonchi.  Expansion of the chest is symmetrical.  Heart reveals no abnormal lift or heave.  First and second heart sounds are normal.  There is no murmur gallop rub or click.  The abdomen is soft and nontender.  Bowel sounds are normoactive.  There is no hepatosplenomegaly or mass.  There are no abdominal bruits.  Extremities reveal no phlebitis or edema.  Pedal pulses are  good.  There is no cyanosis or clubbing.  Neurologic exam is normal strength and no lateralizing weakness.  No sensory deficits.  Integument reveals no rash  Lab Results:  Recent Labs  07/29/13 1353 07/30/13 0635  WBC 14.0* 12.8*  HGB 10.2* 9.5*  PLT 274 249    Recent Labs  07/30/13 0635  NA 127*  K 3.9  CL 88*  CO2 29  GLUCOSE 119*  BUN 14  CREATININE 0.69   No results found for this basename: TROPONINI, CK, MB,  in the last 72 hours Hepatic Function Panel No results found for this basename: PROT, ALBUMIN, AST, ALT, ALKPHOS, BILITOT, BILIDIR, IBILI,  in the last 72 hours No results found for this basename: CHOL,  in the last 72 hours No results found for this basename: PROTIME,  in the last 72 hours  Imaging: Ct Lumbar Spine Wo Contrast  07/30/2013   CLINICAL DATA:  New right lower extremity pain. Status post lumbar surgery 1 week ago.  EXAM: CT LUMBAR SPINE WITHOUT CONTRAST  TECHNIQUE: Multidetector CT imaging of the lumbar spine was performed without intravenous contrast administration. Multiplanar CT image reconstructions were also generated.  COMPARISON:  MRI of the lumbar spine 06/18/2013. Intraoperative radiographs 07/24/2013.  FINDINGS: The patient  is status post extension of a lumbar fusion the included the L3-4 disc level. The patient underwent PLIF at L3-4.  The right pedicle screw at L3 extends to the medial more cortex without reaching the cortex. The left pedicle screw is contained within the bone. The L4 and L5 pedicle screws are unremarkable.  Degenerative changes are noted at the SI joints bilaterally.  Previous fusion at L4-5 is mature.  Vertebral body heights and alignment are maintained. Slight rightward curvature of the lumbar spine persists, centered at L3. There is a vacuum disc at L1-2.  The soft tissue windows demonstrate mild atherosclerotic changes in the and abdominal aorta and branch vessels without aneurysm. A cystic lesion is present in the left  kidney. There is some stranding about the left kidney.  Mild disc disease at T12-L1 and L1-2 is unchanged. Facet hypertrophy is stable at these levels.  L2-3 disc herniation is present. Mild facet hypertrophy is noted bilaterally. Mild central and foraminal narrowing is stable.  L3-4: The patient is status post wide laminectomy bilaterally. Bilateral foramina automated were performed. No residual or recurrent stenosis is evident.  L4-5:  Prior fusion at this level is stable.  L5-S1: A shallow central disc protrusion is present without significant stenosis.  IMPRESSION: 1. Postoperative changes at L3-4 without evidence for the significant stenosis or hardware complication. 2. Stable fusion at L4-5. 3. Stable mild disk disease at T12-L1, L1-2, and L2-3.   Electronically Signed   By: Lawrence Santiago M.D.   On: 07/30/2013 17:14    Cardiac Studies: Telemetry shows normal sinus rhythm Assessment/Plan:  Coronary atherosclerosis of native coronary artery  Carotid artery disease  Hyponatremia  Syncope  The patient has not had any recurrent syncope or presyncope. These episodes in the hospital related to bradycardia and vasovagal response to her GI symptoms.  Nausea with vomiting  This is improved.  UTI (urinary tract infection)  Bradycardia  Heart rate is stable on current dose of Toprol XL 50 mg one daily  Status post lumbar laminectomy  Plan: Okay for discharge soon from cardiac standpoint.  She should followup with Dr. Ron Parker in the office 1-2 weeks after discharge  LOS: 8 days    Carrie Gillespie 08/01/2013, 8:09 AM

## 2013-08-01 NOTE — Progress Notes (Signed)
Physical Therapy Treatment Patient Details Name: MIKALIA HEM MRN: [id number] DOB: 1919-11-27 Today's Date: 08/01/2013 Time: [id number] PT Time Calculation (min): 31 min  PT Assessment / Plan / Recommendation  History of Present Illness 79 yo female with hx seizures, PVD admitted 10/28 for elective lumbar laminectomy.  POD #1 had episode bradycardia with HR 40's, (?vagal) with AMS as well as coffee ground emesis.  Pt quickly recovered after vagal episode with return of mental status, stable BP and HR 90's.  Still c/o nausea, back pain.  Denies chest pain, headache.    PT Comments   Pt stated that she was feeling better today and motivated for PT.  Pt required VC's for functional bathroom activities to maintain precautions.  Pt awaiting d/c to SNF for further rehab to become more functionally independent and for safety.  Follow Up Recommendations  SNF     Does the patient have the potential to tolerate intense rehabilitation     Barriers to Discharge        Equipment Recommendations  Rolling walker with 5" wheels    Recommendations for Other Services    Frequency Min 5X/week   Progress towards PT Goals Progress towards PT goals: Progressing toward goals  Plan Current plan remains appropriate    Precautions / Restrictions Precautions Precautions: Back Precaution Comments: Pt recalled 3/3 precautions today with increased time Required Braces or Orthoses: Spinal Brace Spinal Brace: Applied in sitting position;Lumbar corset Restrictions Weight Bearing Restrictions: No   Pertinent Vitals/Pain Pt denied pain today    Mobility  Bed Mobility Bed Mobility: Not assessed Details for Bed Mobility Assistance: Pt receieved in recliner Transfers Transfers: Sit to Stand;Stand to Sit Sit to Stand: 5: Supervision;From chair/3-in-1 Stand to Sit: To chair/3-in-1;5: Supervision Details for Transfer Assistance: Min VCs for hand placement Ambulation/Gait Ambulation/Gait Assistance: 4: Min  guard Ambulation Distance (Feet): 250 Feet Assistive device: Rolling walker Ambulation/Gait Assistance Details: Min gueard for balance and safety, cues for erect posture Gait Pattern: Step-through pattern;Decreased stride length Gait velocity: decreased Stairs: No    Exercises     PT Diagnosis:    PT Problem List:   PT Treatment Interventions:     PT Goals (current goals can now be found in the care plan section)    Visit Information  Last PT Received On: 08/01/13 Assistance Needed: +1 History of Present Illness: 79 yo female with hx seizures, PVD admitted 10/28 for elective lumbar laminectomy.  POD #1 had episode bradycardia with HR 40's, (?vagal) with AMS as well as coffee ground emesis.  Pt quickly recovered after vagal episode with return of mental status, stable BP and HR 90's.  Still c/o nausea, back pain.  Denies chest pain, headache.     Subjective Data      Cognition  Cognition Arousal/Alertness: Awake/alert Behavior During Therapy: WFL for tasks assessed/performed Overall Cognitive Status: Within Functional Limits for tasks assessed    Balance     End of Session PT - End of Session Equipment Utilized During Treatment: Gait belt Activity Tolerance: Patient tolerated treatment well;Patient limited by fatigue Patient left: in chair;with call bell/phone within reach Nurse Communication: Mobility status   GP     Belva Agee, [place] 08/01/2013, 9:56 AM

## 2013-08-01 NOTE — Progress Notes (Signed)
Clinical Social Work Department CLINICAL SOCIAL WORK PLACEMENT NOTE 08/01/2013  Patient:  Carrie Gillespie, Carrie Gillespie  Account Number:  [id number] Admit date:  07/24/2013  Clinical Social Worker:  Barbette Or, LCSW  Date/time:  07/27/2013 02:00 PM  Clinical Social Work is seeking post-discharge placement for this patient at the following level of care:   [place]   (*CSW will update this form in Epic as items are completed)   07/27/2013  Patient/family provided with [place] Department of Clinical Social Work's list of facilities offering this level of care within the geographic area requested by the patient (or if unable, by the patient's family).  07/27/2013  Patient/family informed of their freedom to choose among providers that offer the needed level of care, that participate in Medicare, Medicaid or managed care program needed by the patient, have an available bed and are willing to accept the patient.  07/27/2013  Patient/family informed of [facility]' ownership interest in [facility], as well as of the fact that they are under no obligation to receive care at this facility.  PASARR submitted to EDS on  PASARR number received from [facility] on   FL2 transmitted to all facilities in geographic area requested by pt/family on  07/27/2013 FL2 transmitted to all facilities within larger geographic area on   Patient informed that his/her managed care company has contracts with or will negotiate with  certain facilities, including the following:     Patient/family informed of bed offers received:  07/30/2013 Patient chooses bed at OTHER Physician recommends and patient chooses bed at    Patient to be transferred to [place] on  08/01/2013 Patient to be transferred to facility by FAMILY  The following physician request were entered in Epic:   Additional Comments: 10/31 No [place] due to [place] placement  Jeanette Caprice, MSW, [place]

## 2013-08-01 NOTE — Progress Notes (Signed)
Seen and agreed 08/01/2013 Jacqualyn Posey PTA [phone] pager [phone] office

## 2013-08-01 NOTE — Progress Notes (Signed)
UR complete.  Khaleem Burchill RN, MSN 

## 2013-08-01 NOTE — Progress Notes (Signed)
Clinical Social Worker facilitated patient discharge by contacting the patient and facility, [facility] in [place]. Patient agreeable to this plan and arranging transport via family . CSW will sign off, as social work intervention is no longer needed.  Jeanette Caprice, MSW, [place]

## 2013-08-01 NOTE — Discharge Summary (Signed)
Physician Discharge Summary  Patient ID: DAWSYN EHRHARD MRN: [id number] DOB/AGE: [birth year] 77 y.o.  Admit date: 07/24/2013 Discharge date: 08/01/2013  Admission Diagnoses: Lumbar spinal stenosis L3-4 status post arthrodesis L4-L5  Discharge Diagnoses: Lumbar spinal stenosis L3-4, status post arthrodesis L4-L5, acute blood loss anemia, coronary artery disease, postoperative hypotension Active Problems:   Coronary atherosclerosis of native coronary artery   Carotid artery disease   Hyponatremia   Syncope   Nausea with vomiting   UTI (urinary tract infection)   Bradycardia   Status post lumbar laminectomy   Discharged Condition: fair  Hospital Course: Patient was admitted to undergo surgical decompression at L3-L4 having had a previous decompression arthrodesis for spinal stenosis at L4-L5. She tolerated surgery well however she had some postoperative hypotension felt to be related to acute blood loss anemia.  Consults: cardiology  Significant Diagnostic Studies: None  Treatments: surgery: Decompression L3-L4 via laminectomy posterior interbody arthrodesis with segmental fixation L3-L5.  Discharge Exam: Blood pressure 160/74, pulse 82, temperature 98.2 F (36.8 C), temperature source Oral, resp. rate 18, height 5' (1.524 m), weight 81.3 kg (179 lb 3.7 oz), SpO2 98.00%. Incision is clean and dry. Motor function is good in both lower extremities. Postoperative CT scan demonstrates that the hardware is well-placed.  Disposition:  patient will be admitted to skilled nursing facility for brief period of time  Discharge Orders   Future Appointments Provider Department Dept Phone   12/12/2013 9:00 AM Hayden Pedro, MD [facility] [phone]   Future Orders Complete By Expires   Call MD for:  redness, tenderness, or signs of infection (pain, swelling, redness, odor or green/yellow discharge around incision site)  As directed    Call MD for:  severe  uncontrolled pain  As directed    Call MD for:  temperature >100.4  As directed    Diet - low sodium heart healthy  As directed    Discharge instructions  As directed    Comments:     Okay to shower. Do not apply salves or appointments to incision. No heavy lifting with the upper extremities greater than 15 pounds. May resume driving when not requiring pain medication and patient feels comfortable with doing so.   Increase activity slowly  As directed        Medication List         ALEVE 220 MG Caps  Generic drug:  Naproxen Sodium  Take 220 mg by mouth daily.     aspirin EC 81 MG tablet  Take 81 mg by mouth daily.     chlorthalidone 25 MG tablet  Commonly known as:  HYGROTON  Take 1 tablet (25 mg total) by mouth daily.     IRON PO  Take 1 tablet by mouth daily.     isosorbide mononitrate 120 MG 24 hr tablet  Commonly known as:  IMDUR  Take 120 mg by mouth daily.     lisinopril 10 MG tablet  Commonly known as:  PRINIVIL,ZESTRIL  Take 10 mg by mouth daily.     methocarbamol 500 MG tablet  Commonly known as:  ROBAXIN  Take 1 tablet (500 mg total) by mouth every 6 (six) hours as needed for muscle spasms.     metoprolol succinate 50 MG 24 hr tablet  Commonly known as:  TOPROL-XL  Take 50 mg by mouth daily. Take with or immediately following a meal.     MULTIVITAL tablet  Take 1 tablet by mouth daily.  PRESERVISION AREDS PO  Take 1 tablet by mouth daily.     nitroGLYCERIN 0.4 MG SL tablet  Commonly known as:  NITROSTAT  Place 0.4 mg under the tongue every 5 (five) minutes as needed for chest pain.     oxyCODONE-acetaminophen 5-325 MG per tablet  Commonly known as:  PERCOCET/ROXICET  Take 1 tablet by mouth every 4 (four) hours as needed for moderate pain.     pravastatin 40 MG tablet  Commonly known as:  PRAVACHOL  Take 40 mg by mouth daily.     PRILOSEC 20 MG capsule  Generic drug:  omeprazole  Take 20 mg by mouth daily.     TEGRETOL-XR 200 MG 12 hr  tablet  Generic drug:  carbamazepine  Take 200 mg by mouth daily.     vitamin B-12 250 MCG tablet  Commonly known as:  CYANOCOBALAMIN  Take 250 mcg by mouth daily.     VITAMIN D PO  Take 1 tablet by mouth daily.         SignedEarleen Newport 08/01/2013, 9:41 AM

## 2013-08-08 DIAGNOSIS — R5381 Other malaise: Secondary | ICD-10-CM | POA: Diagnosis not present

## 2013-08-08 DIAGNOSIS — E876 Hypokalemia: Secondary | ICD-10-CM | POA: Diagnosis not present

## 2013-08-10 DIAGNOSIS — R5381 Other malaise: Secondary | ICD-10-CM | POA: Diagnosis not present

## 2013-08-10 DIAGNOSIS — I10 Essential (primary) hypertension: Secondary | ICD-10-CM | POA: Diagnosis not present

## 2013-08-10 DIAGNOSIS — M48061 Spinal stenosis, lumbar region without neurogenic claudication: Secondary | ICD-10-CM | POA: Diagnosis not present

## 2013-08-10 DIAGNOSIS — K219 Gastro-esophageal reflux disease without esophagitis: Secondary | ICD-10-CM | POA: Diagnosis not present

## 2013-08-15 DIAGNOSIS — I119 Hypertensive heart disease without heart failure: Secondary | ICD-10-CM | POA: Diagnosis not present

## 2013-08-15 DIAGNOSIS — R5381 Other malaise: Secondary | ICD-10-CM | POA: Diagnosis not present

## 2013-08-15 DIAGNOSIS — I10 Essential (primary) hypertension: Secondary | ICD-10-CM | POA: Diagnosis not present

## 2013-08-15 DIAGNOSIS — M48061 Spinal stenosis, lumbar region without neurogenic claudication: Secondary | ICD-10-CM | POA: Diagnosis not present

## 2013-08-17 DIAGNOSIS — R059 Cough, unspecified: Secondary | ICD-10-CM | POA: Diagnosis not present

## 2013-08-22 DIAGNOSIS — M545 Low back pain, unspecified: Secondary | ICD-10-CM | POA: Diagnosis not present

## 2013-08-22 DIAGNOSIS — M48061 Spinal stenosis, lumbar region without neurogenic claudication: Secondary | ICD-10-CM | POA: Diagnosis not present

## 2013-08-25 ENCOUNTER — Encounter: Payer: Self-pay | Admitting: Cardiology

## 2013-08-25 DIAGNOSIS — R05 Cough: Secondary | ICD-10-CM | POA: Diagnosis not present

## 2013-08-25 DIAGNOSIS — Z7982 Long term (current) use of aspirin: Secondary | ICD-10-CM | POA: Diagnosis not present

## 2013-08-25 DIAGNOSIS — E7800 Pure hypercholesterolemia, unspecified: Secondary | ICD-10-CM | POA: Diagnosis not present

## 2013-08-25 DIAGNOSIS — J40 Bronchitis, not specified as acute or chronic: Secondary | ICD-10-CM | POA: Diagnosis not present

## 2013-08-25 DIAGNOSIS — I10 Essential (primary) hypertension: Secondary | ICD-10-CM | POA: Diagnosis not present

## 2013-08-25 DIAGNOSIS — Z8249 Family history of ischemic heart disease and other diseases of the circulatory system: Secondary | ICD-10-CM | POA: Diagnosis not present

## 2013-08-25 DIAGNOSIS — R059 Cough, unspecified: Secondary | ICD-10-CM | POA: Diagnosis not present

## 2013-08-25 DIAGNOSIS — Z79899 Other long term (current) drug therapy: Secondary | ICD-10-CM | POA: Diagnosis not present

## 2013-08-25 DIAGNOSIS — Z9861 Coronary angioplasty status: Secondary | ICD-10-CM | POA: Diagnosis not present

## 2013-08-25 DIAGNOSIS — I519 Heart disease, unspecified: Secondary | ICD-10-CM | POA: Diagnosis not present

## 2013-08-27 DIAGNOSIS — R059 Cough, unspecified: Secondary | ICD-10-CM | POA: Diagnosis not present

## 2013-08-27 DIAGNOSIS — R05 Cough: Secondary | ICD-10-CM | POA: Diagnosis not present

## 2013-08-30 DIAGNOSIS — M1990 Unspecified osteoarthritis, unspecified site: Secondary | ICD-10-CM | POA: Diagnosis not present

## 2013-08-30 DIAGNOSIS — J019 Acute sinusitis, unspecified: Secondary | ICD-10-CM | POA: Diagnosis not present

## 2013-08-30 DIAGNOSIS — M4800 Spinal stenosis, site unspecified: Secondary | ICD-10-CM | POA: Diagnosis not present

## 2013-09-05 DIAGNOSIS — M1710 Unilateral primary osteoarthritis, unspecified knee: Secondary | ICD-10-CM | POA: Diagnosis not present

## 2013-09-13 DIAGNOSIS — H353190 Nonexudative age-related macular degeneration, unspecified eye, stage unspecified: Secondary | ICD-10-CM | POA: Diagnosis not present

## 2013-09-24 DIAGNOSIS — I10 Essential (primary) hypertension: Secondary | ICD-10-CM | POA: Diagnosis not present

## 2013-09-24 DIAGNOSIS — K219 Gastro-esophageal reflux disease without esophagitis: Secondary | ICD-10-CM | POA: Diagnosis not present

## 2013-09-24 DIAGNOSIS — E782 Mixed hyperlipidemia: Secondary | ICD-10-CM | POA: Diagnosis not present

## 2013-09-28 DIAGNOSIS — M4800 Spinal stenosis, site unspecified: Secondary | ICD-10-CM | POA: Diagnosis not present

## 2013-09-28 DIAGNOSIS — E871 Hypo-osmolality and hyponatremia: Secondary | ICD-10-CM | POA: Diagnosis not present

## 2013-09-28 DIAGNOSIS — G40909 Epilepsy, unspecified, not intractable, without status epilepticus: Secondary | ICD-10-CM | POA: Diagnosis not present

## 2013-09-28 DIAGNOSIS — K219 Gastro-esophageal reflux disease without esophagitis: Secondary | ICD-10-CM | POA: Diagnosis not present

## 2013-09-28 DIAGNOSIS — M1990 Unspecified osteoarthritis, unspecified site: Secondary | ICD-10-CM | POA: Diagnosis not present

## 2013-09-28 DIAGNOSIS — I10 Essential (primary) hypertension: Secondary | ICD-10-CM | POA: Diagnosis not present

## 2013-09-28 DIAGNOSIS — N189 Chronic kidney disease, unspecified: Secondary | ICD-10-CM | POA: Diagnosis not present

## 2013-09-28 DIAGNOSIS — E782 Mixed hyperlipidemia: Secondary | ICD-10-CM | POA: Diagnosis not present

## 2013-10-04 DIAGNOSIS — M48061 Spinal stenosis, lumbar region without neurogenic claudication: Secondary | ICD-10-CM | POA: Diagnosis not present

## 2013-10-04 DIAGNOSIS — I10 Essential (primary) hypertension: Secondary | ICD-10-CM | POA: Diagnosis not present

## 2013-10-04 DIAGNOSIS — M25559 Pain in unspecified hip: Secondary | ICD-10-CM | POA: Diagnosis not present

## 2013-10-04 DIAGNOSIS — E669 Obesity, unspecified: Secondary | ICD-10-CM | POA: Diagnosis not present

## 2013-10-04 DIAGNOSIS — IMO0002 Reserved for concepts with insufficient information to code with codable children: Secondary | ICD-10-CM | POA: Diagnosis not present

## 2013-10-10 DIAGNOSIS — M1710 Unilateral primary osteoarthritis, unspecified knee: Secondary | ICD-10-CM | POA: Diagnosis not present

## 2013-10-12 DIAGNOSIS — M1990 Unspecified osteoarthritis, unspecified site: Secondary | ICD-10-CM | POA: Diagnosis not present

## 2013-10-12 DIAGNOSIS — M25559 Pain in unspecified hip: Secondary | ICD-10-CM | POA: Diagnosis not present

## 2013-10-16 DIAGNOSIS — Z96659 Presence of unspecified artificial knee joint: Secondary | ICD-10-CM | POA: Diagnosis not present

## 2013-10-16 DIAGNOSIS — I10 Essential (primary) hypertension: Secondary | ICD-10-CM | POA: Diagnosis not present

## 2013-10-16 DIAGNOSIS — M79609 Pain in unspecified limb: Secondary | ICD-10-CM | POA: Diagnosis not present

## 2013-10-16 DIAGNOSIS — M25559 Pain in unspecified hip: Secondary | ICD-10-CM | POA: Diagnosis not present

## 2013-10-16 DIAGNOSIS — E7800 Pure hypercholesterolemia, unspecified: Secondary | ICD-10-CM | POA: Diagnosis not present

## 2013-10-16 DIAGNOSIS — Z905 Acquired absence of kidney: Secondary | ICD-10-CM | POA: Diagnosis not present

## 2013-10-16 DIAGNOSIS — Z7982 Long term (current) use of aspirin: Secondary | ICD-10-CM | POA: Diagnosis not present

## 2013-10-16 DIAGNOSIS — Z9861 Coronary angioplasty status: Secondary | ICD-10-CM | POA: Diagnosis not present

## 2013-10-16 DIAGNOSIS — Z79899 Other long term (current) drug therapy: Secondary | ICD-10-CM | POA: Diagnosis not present

## 2013-10-17 ENCOUNTER — Encounter: Payer: Self-pay | Admitting: Cardiology

## 2013-10-17 DIAGNOSIS — M1990 Unspecified osteoarthritis, unspecified site: Secondary | ICD-10-CM | POA: Diagnosis not present

## 2013-10-17 DIAGNOSIS — N189 Chronic kidney disease, unspecified: Secondary | ICD-10-CM | POA: Diagnosis not present

## 2013-10-17 DIAGNOSIS — R252 Cramp and spasm: Secondary | ICD-10-CM | POA: Diagnosis not present

## 2013-10-17 DIAGNOSIS — M4800 Spinal stenosis, site unspecified: Secondary | ICD-10-CM | POA: Diagnosis not present

## 2013-10-17 DIAGNOSIS — M79609 Pain in unspecified limb: Secondary | ICD-10-CM | POA: Diagnosis not present

## 2013-10-24 DIAGNOSIS — M1710 Unilateral primary osteoarthritis, unspecified knee: Secondary | ICD-10-CM | POA: Diagnosis not present

## 2013-10-31 DIAGNOSIS — G5790 Unspecified mononeuropathy of unspecified lower limb: Secondary | ICD-10-CM | POA: Diagnosis not present

## 2013-10-31 DIAGNOSIS — G608 Other hereditary and idiopathic neuropathies: Secondary | ICD-10-CM | POA: Diagnosis not present

## 2013-10-31 DIAGNOSIS — M79609 Pain in unspecified limb: Secondary | ICD-10-CM | POA: Diagnosis not present

## 2013-11-28 DIAGNOSIS — G5790 Unspecified mononeuropathy of unspecified lower limb: Secondary | ICD-10-CM | POA: Diagnosis not present

## 2013-11-28 DIAGNOSIS — M79609 Pain in unspecified limb: Secondary | ICD-10-CM | POA: Diagnosis not present

## 2013-11-28 DIAGNOSIS — G608 Other hereditary and idiopathic neuropathies: Secondary | ICD-10-CM | POA: Diagnosis not present

## 2013-12-06 DIAGNOSIS — N390 Urinary tract infection, site not specified: Secondary | ICD-10-CM | POA: Diagnosis not present

## 2013-12-06 DIAGNOSIS — Z85528 Personal history of other malignant neoplasm of kidney: Secondary | ICD-10-CM | POA: Diagnosis not present

## 2013-12-06 DIAGNOSIS — N281 Cyst of kidney, acquired: Secondary | ICD-10-CM | POA: Diagnosis not present

## 2013-12-06 DIAGNOSIS — Z9889 Other specified postprocedural states: Secondary | ICD-10-CM | POA: Diagnosis not present

## 2013-12-06 DIAGNOSIS — Q619 Cystic kidney disease, unspecified: Secondary | ICD-10-CM | POA: Diagnosis not present

## 2013-12-06 DIAGNOSIS — C649 Malignant neoplasm of unspecified kidney, except renal pelvis: Secondary | ICD-10-CM | POA: Diagnosis not present

## 2013-12-12 ENCOUNTER — Ambulatory Visit (INDEPENDENT_AMBULATORY_CARE_PROVIDER_SITE_OTHER): Payer: Medicare Other | Admitting: Ophthalmology

## 2013-12-12 DIAGNOSIS — H35039 Hypertensive retinopathy, unspecified eye: Secondary | ICD-10-CM

## 2013-12-12 DIAGNOSIS — D3130 Benign neoplasm of unspecified choroid: Secondary | ICD-10-CM | POA: Diagnosis not present

## 2013-12-12 DIAGNOSIS — H3530 Unspecified macular degeneration: Secondary | ICD-10-CM

## 2013-12-12 DIAGNOSIS — I10 Essential (primary) hypertension: Secondary | ICD-10-CM

## 2013-12-12 DIAGNOSIS — H4320 Crystalline deposits in vitreous body, unspecified eye: Secondary | ICD-10-CM

## 2013-12-19 DIAGNOSIS — G608 Other hereditary and idiopathic neuropathies: Secondary | ICD-10-CM | POA: Diagnosis not present

## 2013-12-19 DIAGNOSIS — G5790 Unspecified mononeuropathy of unspecified lower limb: Secondary | ICD-10-CM | POA: Diagnosis not present

## 2013-12-19 DIAGNOSIS — M79609 Pain in unspecified limb: Secondary | ICD-10-CM | POA: Diagnosis not present

## 2013-12-26 DIAGNOSIS — I10 Essential (primary) hypertension: Secondary | ICD-10-CM | POA: Diagnosis not present

## 2013-12-26 DIAGNOSIS — E782 Mixed hyperlipidemia: Secondary | ICD-10-CM | POA: Diagnosis not present

## 2014-01-02 DIAGNOSIS — G40909 Epilepsy, unspecified, not intractable, without status epilepticus: Secondary | ICD-10-CM | POA: Diagnosis not present

## 2014-01-02 DIAGNOSIS — E782 Mixed hyperlipidemia: Secondary | ICD-10-CM | POA: Diagnosis not present

## 2014-01-02 DIAGNOSIS — E871 Hypo-osmolality and hyponatremia: Secondary | ICD-10-CM | POA: Diagnosis not present

## 2014-01-02 DIAGNOSIS — K219 Gastro-esophageal reflux disease without esophagitis: Secondary | ICD-10-CM | POA: Diagnosis not present

## 2014-01-02 DIAGNOSIS — G608 Other hereditary and idiopathic neuropathies: Secondary | ICD-10-CM | POA: Diagnosis not present

## 2014-01-02 DIAGNOSIS — N189 Chronic kidney disease, unspecified: Secondary | ICD-10-CM | POA: Diagnosis not present

## 2014-01-02 DIAGNOSIS — I10 Essential (primary) hypertension: Secondary | ICD-10-CM | POA: Diagnosis not present

## 2014-01-02 DIAGNOSIS — M4800 Spinal stenosis, site unspecified: Secondary | ICD-10-CM | POA: Diagnosis not present

## 2014-01-02 DIAGNOSIS — M1990 Unspecified osteoarthritis, unspecified site: Secondary | ICD-10-CM | POA: Diagnosis not present

## 2014-01-02 DIAGNOSIS — M79609 Pain in unspecified limb: Secondary | ICD-10-CM | POA: Diagnosis not present

## 2014-01-02 DIAGNOSIS — G5790 Unspecified mononeuropathy of unspecified lower limb: Secondary | ICD-10-CM | POA: Diagnosis not present

## 2014-01-23 ENCOUNTER — Ambulatory Visit (INDEPENDENT_AMBULATORY_CARE_PROVIDER_SITE_OTHER): Payer: Medicare Other | Admitting: Cardiology

## 2014-01-23 ENCOUNTER — Encounter: Payer: Self-pay | Admitting: Cardiology

## 2014-01-23 VITALS — BP 181/68 | HR 66 | Ht 60.0 in | Wt 174.8 lb

## 2014-01-23 DIAGNOSIS — I10 Essential (primary) hypertension: Secondary | ICD-10-CM

## 2014-01-23 DIAGNOSIS — I2510 Atherosclerotic heart disease of native coronary artery without angina pectoris: Secondary | ICD-10-CM

## 2014-01-23 NOTE — Progress Notes (Signed)
Patient ID: Carrie Gillespie, female   DOB: [birth year], 80 y.o.   MRN: [id number]    HPI  Patient is seen today for yearly followup. She is a delightful elderly lady and she is quite stable. She had back surgery since I saw her last. She had a long recovery but she is improved. She's not having any chest pain or shortness of breath very  No Known Allergies  Current Outpatient Prescriptions  Medication Sig Dispense Refill  . aspirin EC 81 MG tablet Take 81 mg by mouth daily.        . carbamazepine (TEGRETOL XR) 200 MG 12 hr tablet Take 200 mg by mouth daily.        . chlorthalidone (HYGROTON) 25 MG tablet Take 1 tablet (25 mg total) by mouth daily.  30 tablet  6  . Cholecalciferol (VITAMIN D PO) Take 1 tablet by mouth daily.      . IRON PO Take 1 tablet by mouth daily.      . isosorbide mononitrate (IMDUR) 120 MG 24 hr tablet Take 120 mg by mouth daily.      Marland Kitchen lisinopril (PRINIVIL,ZESTRIL) 10 MG tablet Take 10 mg by mouth daily.       . metoprolol succinate (TOPROL-XL) 50 MG 24 hr tablet Take 50 mg by mouth daily. Take with or immediately following a meal.      . Multiple Vitamins-Minerals (MULTIVITAL) tablet Take 1 tablet by mouth daily.        . Multiple Vitamins-Minerals (PRESERVISION AREDS PO) Take 1 tablet by mouth daily.      . Naproxen Sodium (ALEVE) 220 MG CAPS Take 220 mg by mouth daily.      . nitroGLYCERIN (NITROSTAT) 0.4 MG SL tablet Place 0.4 mg under the tongue every 5 (five) minutes as needed for chest pain.       Marland Kitchen omeprazole (PRILOSEC) 20 MG capsule Take 20 mg by mouth daily.        . pravastatin (PRAVACHOL) 40 MG tablet Take 40 mg by mouth daily.      . vitamin B-12 (CYANOCOBALAMIN) 250 MCG tablet Take 250 mcg by mouth daily.         No current facility-administered medications for this visit.    History   Social History  . Marital Status: Married    Spouse Name: N/A    Number of Children: N/A  . Years of Education: N/A   Occupational History  . Not on file.    Social History Main Topics  . Smoking status: Never Smoker   . Smokeless tobacco: Never Used  . Alcohol Use: No  . Drug Use: No  . Sexual Activity: Not on file   Other Topics Concern  . Not on file   Social History Narrative  . No narrative on file    Family History  Problem Relation Age of Onset  . Hypertension Other   . Diabetes Other     Past Medical History  Diagnosis Date  . Peripheral vascular disease, unspecified     Minimal internal carotid artery disease  . Unspecified essential hypertension   . Other and unspecified hyperlipidemia   . Coronary atherosclerosis of native coronary artery     Status post stent to right coronary 2004, catheterization 2005 nonobstructive CAD, normal LV function, Cardiolite study 2008 negative for ischemia. Residual moderate ostial RCA disease. Stress testing 2012 negative for ischemia ejection fraction 78%  . Carotid artery disease     with an occluded right  vertebral artery, otherwise nonobstructive carotid artery disease  . Nerve entrapment syndrome of lower extremity      status post prior back surgery and weakness in left leg  . Ejection fraction     LV function normal by nuclear,  2012  . Arthritis   . Seizures     Past Surgical History  Procedure Laterality Date  . Kidney surgery  2010    1/4 of right kidney removed [facility]  . Cardiac catheterization      nonobstructive coronary artery disease  . Cataract extraction    . Knee arthroplasty Left 2004  . Partial hysterectomy    . Coronary angioplasty  1998-1999    Patient Active Problem List   Diagnosis Date Noted  . Nausea with vomiting 07/29/2013  . UTI (urinary tract infection) 07/29/2013  . Bradycardia 07/29/2013  . Status post lumbar laminectomy 07/29/2013  . Syncope 07/25/2013  . Ejection fraction   . Otitis externa 12/16/2011  . Neuropathic pain of lower extremity 12/16/2011  . Nerve entrapment syndrome of lower extremity   . Hyponatremia 06/21/2011  .  Insomnia 06/21/2011  . Peripheral vascular disease, unspecified   . Coronary atherosclerosis of native coronary artery   . Carotid artery disease   . HYPERLIPIDEMIA-MIXED 05/23/2009  . HYPERTENSION, UNSPECIFIED 05/23/2009    ROS   Patient denies fever, chills, headache, sweats, rash, change in vision, change in hearing, chest pain, cough, nausea or vomiting, urinary symptoms. All other systems are reviewed and are negative.  PHYSICAL EXAM  Patient looks quite good. She is overweight. She's here with her son. She is oriented to person time and place. Affect is normal. Head is atraumatic. Sclera and conjunctiva appear normal. There is no jugulovenous distention. Lungs are clear. Respiratory effort is not labored. Cardiac exam reveals S1 and S2. The abdomen is soft. There is no peripheral edema. There are no musculoskeletal deformities. There are no skin rashes.  Filed Vitals:   01/23/14 0929  BP: 181/68  Pulse: 66  Height: 5' (1.524 m)  Weight: 174 lb 12.8 oz (79.289 kg)  SpO2: 97%     ASSESSMENT & PLAN

## 2014-01-23 NOTE — Assessment & Plan Note (Signed)
Her systolic pressure is elevated today. I've instructed her to check her blood pressure at home and record. If her pressure at home remains above 150 on a regular basis she will be in touch with [place] or with her primary physician. Otherwise she will keep the record and take them to Dr. Pleas Koch when she sees him next.

## 2014-01-23 NOTE — Patient Instructions (Signed)
Your physician recommends that you schedule a follow-up appointment in: 1 year. You will receive a reminder letter in the mail in about 10 months reminding you to call and schedule your appointment. If you don't receive this letter, please contact our office. Your physician recommends that you continue on your current medications as directed. Please refer to the Current Medication list given to you today. Your physician has requested that you regularly monitor and record your blood pressure readings at home. Please use the same machine at the same time of day to check your readings and record them. Please record your blood pressure reading from your visits with Dr. Pleas Koch. Please contact our office or Dr. Lizbeth Bark office if your systolic BP (top number) number is greater than 150.

## 2014-01-23 NOTE — Assessment & Plan Note (Signed)
Coronary disease is stable. No further workup is needed. I'll see her back in a year.

## 2014-01-30 DIAGNOSIS — Z6831 Body mass index (BMI) 31.0-31.9, adult: Secondary | ICD-10-CM | POA: Diagnosis not present

## 2014-01-30 DIAGNOSIS — I10 Essential (primary) hypertension: Secondary | ICD-10-CM | POA: Diagnosis not present

## 2014-01-30 DIAGNOSIS — M545 Low back pain, unspecified: Secondary | ICD-10-CM | POA: Diagnosis not present

## 2014-02-06 DIAGNOSIS — M79609 Pain in unspecified limb: Secondary | ICD-10-CM | POA: Diagnosis not present

## 2014-02-06 DIAGNOSIS — G5790 Unspecified mononeuropathy of unspecified lower limb: Secondary | ICD-10-CM | POA: Diagnosis not present

## 2014-02-06 DIAGNOSIS — G608 Other hereditary and idiopathic neuropathies: Secondary | ICD-10-CM | POA: Diagnosis not present

## 2014-03-04 ENCOUNTER — Other Ambulatory Visit: Payer: Self-pay | Admitting: Cardiology

## 2014-03-05 ENCOUNTER — Telehealth: Payer: Self-pay | Admitting: Cardiology

## 2014-03-05 MED ORDER — CHLORTHALIDONE 25 MG PO TABS: 25.0000 mg | ORAL_TABLET | Freq: Every day | ORAL | Status: DC

## 2014-03-05 NOTE — Telephone Encounter (Signed)
Carrie Gillespie called wanting to know if  CHLORTHALIDONE 25 MG PO TABS can be given as 90 pills verses 30 pills.

## 2014-03-06 DIAGNOSIS — G5790 Unspecified mononeuropathy of unspecified lower limb: Secondary | ICD-10-CM | POA: Diagnosis not present

## 2014-03-06 DIAGNOSIS — G608 Other hereditary and idiopathic neuropathies: Secondary | ICD-10-CM | POA: Diagnosis not present

## 2014-03-06 DIAGNOSIS — M79609 Pain in unspecified limb: Secondary | ICD-10-CM | POA: Diagnosis not present

## 2014-04-04 DIAGNOSIS — IMO0002 Reserved for concepts with insufficient information to code with codable children: Secondary | ICD-10-CM | POA: Diagnosis not present

## 2014-04-04 DIAGNOSIS — I10 Essential (primary) hypertension: Secondary | ICD-10-CM | POA: Diagnosis not present

## 2014-04-04 DIAGNOSIS — Z6831 Body mass index (BMI) 31.0-31.9, adult: Secondary | ICD-10-CM | POA: Diagnosis not present

## 2014-05-08 DIAGNOSIS — M1710 Unilateral primary osteoarthritis, unspecified knee: Secondary | ICD-10-CM | POA: Diagnosis not present

## 2014-05-25 DIAGNOSIS — J438 Other emphysema: Secondary | ICD-10-CM | POA: Diagnosis not present

## 2014-05-25 DIAGNOSIS — Z800 Family history of malignant neoplasm of digestive organs: Secondary | ICD-10-CM | POA: Diagnosis not present

## 2014-05-25 DIAGNOSIS — Z9861 Coronary angioplasty status: Secondary | ICD-10-CM | POA: Diagnosis not present

## 2014-05-25 DIAGNOSIS — I2510 Atherosclerotic heart disease of native coronary artery without angina pectoris: Secondary | ICD-10-CM | POA: Diagnosis not present

## 2014-05-25 DIAGNOSIS — E871 Hypo-osmolality and hyponatremia: Secondary | ICD-10-CM | POA: Diagnosis not present

## 2014-05-25 DIAGNOSIS — E785 Hyperlipidemia, unspecified: Secondary | ICD-10-CM | POA: Diagnosis not present

## 2014-05-25 DIAGNOSIS — R079 Chest pain, unspecified: Secondary | ICD-10-CM | POA: Diagnosis not present

## 2014-05-25 DIAGNOSIS — I200 Unstable angina: Secondary | ICD-10-CM | POA: Diagnosis not present

## 2014-05-25 DIAGNOSIS — Z7982 Long term (current) use of aspirin: Secondary | ICD-10-CM | POA: Diagnosis not present

## 2014-05-25 DIAGNOSIS — K219 Gastro-esophageal reflux disease without esophagitis: Secondary | ICD-10-CM | POA: Diagnosis present

## 2014-05-25 DIAGNOSIS — I209 Angina pectoris, unspecified: Secondary | ICD-10-CM | POA: Diagnosis not present

## 2014-05-25 DIAGNOSIS — I10 Essential (primary) hypertension: Secondary | ICD-10-CM | POA: Diagnosis not present

## 2014-05-25 DIAGNOSIS — G40909 Epilepsy, unspecified, not intractable, without status epilepticus: Secondary | ICD-10-CM | POA: Diagnosis not present

## 2014-05-25 DIAGNOSIS — Z79899 Other long term (current) drug therapy: Secondary | ICD-10-CM | POA: Diagnosis not present

## 2014-05-25 DIAGNOSIS — E7800 Pure hypercholesterolemia, unspecified: Secondary | ICD-10-CM | POA: Diagnosis present

## 2014-05-25 DIAGNOSIS — Z801 Family history of malignant neoplasm of trachea, bronchus and lung: Secondary | ICD-10-CM | POA: Diagnosis not present

## 2014-05-27 DIAGNOSIS — R079 Chest pain, unspecified: Secondary | ICD-10-CM | POA: Diagnosis not present

## 2014-05-27 DIAGNOSIS — E871 Hypo-osmolality and hyponatremia: Secondary | ICD-10-CM | POA: Diagnosis not present

## 2014-06-05 DIAGNOSIS — E782 Mixed hyperlipidemia: Secondary | ICD-10-CM | POA: Diagnosis not present

## 2014-06-05 DIAGNOSIS — M1990 Unspecified osteoarthritis, unspecified site: Secondary | ICD-10-CM | POA: Diagnosis not present

## 2014-06-05 DIAGNOSIS — I10 Essential (primary) hypertension: Secondary | ICD-10-CM | POA: Diagnosis not present

## 2014-06-05 DIAGNOSIS — N189 Chronic kidney disease, unspecified: Secondary | ICD-10-CM | POA: Diagnosis not present

## 2014-06-05 DIAGNOSIS — H6120 Impacted cerumen, unspecified ear: Secondary | ICD-10-CM | POA: Diagnosis not present

## 2014-06-05 DIAGNOSIS — M4800 Spinal stenosis, site unspecified: Secondary | ICD-10-CM | POA: Diagnosis not present

## 2014-06-05 DIAGNOSIS — K219 Gastro-esophageal reflux disease without esophagitis: Secondary | ICD-10-CM | POA: Diagnosis not present

## 2014-06-05 DIAGNOSIS — G40909 Epilepsy, unspecified, not intractable, without status epilepticus: Secondary | ICD-10-CM | POA: Diagnosis not present

## 2014-06-05 DIAGNOSIS — E871 Hypo-osmolality and hyponatremia: Secondary | ICD-10-CM | POA: Diagnosis not present

## 2014-06-06 DIAGNOSIS — IMO0002 Reserved for concepts with insufficient information to code with codable children: Secondary | ICD-10-CM | POA: Diagnosis not present

## 2014-06-06 DIAGNOSIS — Z6831 Body mass index (BMI) 31.0-31.9, adult: Secondary | ICD-10-CM | POA: Diagnosis not present

## 2014-06-06 DIAGNOSIS — I10 Essential (primary) hypertension: Secondary | ICD-10-CM | POA: Diagnosis not present

## 2014-06-10 DIAGNOSIS — R079 Chest pain, unspecified: Secondary | ICD-10-CM | POA: Diagnosis not present

## 2014-06-10 DIAGNOSIS — Z9861 Coronary angioplasty status: Secondary | ICD-10-CM | POA: Diagnosis not present

## 2014-06-10 DIAGNOSIS — M79609 Pain in unspecified limb: Secondary | ICD-10-CM | POA: Diagnosis not present

## 2014-06-10 DIAGNOSIS — I209 Angina pectoris, unspecified: Secondary | ICD-10-CM | POA: Diagnosis not present

## 2014-06-13 DIAGNOSIS — Z905 Acquired absence of kidney: Secondary | ICD-10-CM | POA: Diagnosis not present

## 2014-06-13 DIAGNOSIS — E7800 Pure hypercholesterolemia, unspecified: Secondary | ICD-10-CM | POA: Diagnosis not present

## 2014-06-13 DIAGNOSIS — Z791 Long term (current) use of non-steroidal anti-inflammatories (NSAID): Secondary | ICD-10-CM | POA: Diagnosis not present

## 2014-06-13 DIAGNOSIS — Z23 Encounter for immunization: Secondary | ICD-10-CM | POA: Diagnosis not present

## 2014-06-13 DIAGNOSIS — R079 Chest pain, unspecified: Secondary | ICD-10-CM | POA: Diagnosis not present

## 2014-06-13 DIAGNOSIS — S0003XA Contusion of scalp, initial encounter: Secondary | ICD-10-CM | POA: Diagnosis not present

## 2014-06-13 DIAGNOSIS — Z96659 Presence of unspecified artificial knee joint: Secondary | ICD-10-CM | POA: Diagnosis not present

## 2014-06-13 DIAGNOSIS — Z87898 Personal history of other specified conditions: Secondary | ICD-10-CM | POA: Diagnosis not present

## 2014-06-13 DIAGNOSIS — I10 Essential (primary) hypertension: Secondary | ICD-10-CM | POA: Diagnosis not present

## 2014-06-13 DIAGNOSIS — M25569 Pain in unspecified knee: Secondary | ICD-10-CM | POA: Diagnosis not present

## 2014-06-13 DIAGNOSIS — Z79899 Other long term (current) drug therapy: Secondary | ICD-10-CM | POA: Diagnosis not present

## 2014-06-13 DIAGNOSIS — Z90710 Acquired absence of both cervix and uterus: Secondary | ICD-10-CM | POA: Diagnosis not present

## 2014-06-13 DIAGNOSIS — Z9889 Other specified postprocedural states: Secondary | ICD-10-CM | POA: Diagnosis not present

## 2014-06-13 DIAGNOSIS — I519 Heart disease, unspecified: Secondary | ICD-10-CM | POA: Diagnosis not present

## 2014-06-13 DIAGNOSIS — S8000XA Contusion of unspecified knee, initial encounter: Secondary | ICD-10-CM | POA: Diagnosis not present

## 2014-06-13 DIAGNOSIS — IMO0002 Reserved for concepts with insufficient information to code with codable children: Secondary | ICD-10-CM | POA: Diagnosis not present

## 2014-06-13 DIAGNOSIS — S0990XA Unspecified injury of head, initial encounter: Secondary | ICD-10-CM | POA: Diagnosis not present

## 2014-06-13 DIAGNOSIS — R51 Headache: Secondary | ICD-10-CM | POA: Diagnosis not present

## 2014-06-13 DIAGNOSIS — Z8249 Family history of ischemic heart disease and other diseases of the circulatory system: Secondary | ICD-10-CM | POA: Diagnosis not present

## 2014-06-13 DIAGNOSIS — S0993XA Unspecified injury of face, initial encounter: Secondary | ICD-10-CM | POA: Diagnosis not present

## 2014-06-13 DIAGNOSIS — M79609 Pain in unspecified limb: Secondary | ICD-10-CM | POA: Diagnosis not present

## 2014-06-13 DIAGNOSIS — W010XXA Fall on same level from slipping, tripping and stumbling without subsequent striking against object, initial encounter: Secondary | ICD-10-CM | POA: Diagnosis not present

## 2014-06-13 DIAGNOSIS — Z9861 Coronary angioplasty status: Secondary | ICD-10-CM | POA: Diagnosis not present

## 2014-06-13 DIAGNOSIS — S6990XA Unspecified injury of unspecified wrist, hand and finger(s), initial encounter: Secondary | ICD-10-CM | POA: Diagnosis not present

## 2014-06-13 DIAGNOSIS — S1093XA Contusion of unspecified part of neck, initial encounter: Secondary | ICD-10-CM | POA: Diagnosis not present

## 2014-06-13 DIAGNOSIS — Z7982 Long term (current) use of aspirin: Secondary | ICD-10-CM | POA: Diagnosis not present

## 2014-06-26 DIAGNOSIS — M1710 Unilateral primary osteoarthritis, unspecified knee: Secondary | ICD-10-CM | POA: Diagnosis not present

## 2014-06-27 DIAGNOSIS — H3531 Nonexudative age-related macular degeneration: Secondary | ICD-10-CM | POA: Diagnosis not present

## 2014-06-28 DIAGNOSIS — E871 Hypo-osmolality and hyponatremia: Secondary | ICD-10-CM | POA: Diagnosis not present

## 2014-06-28 DIAGNOSIS — G40509 Epileptic seizures related to external causes, not intractable, without status epilepticus: Secondary | ICD-10-CM | POA: Diagnosis not present

## 2014-06-28 DIAGNOSIS — I10 Essential (primary) hypertension: Secondary | ICD-10-CM | POA: Diagnosis not present

## 2014-06-28 DIAGNOSIS — E782 Mixed hyperlipidemia: Secondary | ICD-10-CM | POA: Diagnosis not present

## 2014-06-28 DIAGNOSIS — N189 Chronic kidney disease, unspecified: Secondary | ICD-10-CM | POA: Diagnosis not present

## 2014-07-02 DIAGNOSIS — I10 Essential (primary) hypertension: Secondary | ICD-10-CM | POA: Diagnosis not present

## 2014-07-02 DIAGNOSIS — E782 Mixed hyperlipidemia: Secondary | ICD-10-CM | POA: Diagnosis not present

## 2014-07-02 DIAGNOSIS — N189 Chronic kidney disease, unspecified: Secondary | ICD-10-CM | POA: Diagnosis not present

## 2014-07-02 DIAGNOSIS — E871 Hypo-osmolality and hyponatremia: Secondary | ICD-10-CM | POA: Diagnosis not present

## 2014-07-10 DIAGNOSIS — M1711 Unilateral primary osteoarthritis, right knee: Secondary | ICD-10-CM | POA: Diagnosis not present

## 2014-07-10 DIAGNOSIS — M160 Bilateral primary osteoarthritis of hip: Secondary | ICD-10-CM | POA: Diagnosis not present

## 2014-07-17 DIAGNOSIS — M1711 Unilateral primary osteoarthritis, right knee: Secondary | ICD-10-CM | POA: Diagnosis not present

## 2014-07-19 DIAGNOSIS — G2581 Restless legs syndrome: Secondary | ICD-10-CM | POA: Diagnosis not present

## 2014-07-19 DIAGNOSIS — E782 Mixed hyperlipidemia: Secondary | ICD-10-CM | POA: Diagnosis not present

## 2014-07-19 DIAGNOSIS — Z23 Encounter for immunization: Secondary | ICD-10-CM | POA: Diagnosis not present

## 2014-07-19 DIAGNOSIS — F4320 Adjustment disorder, unspecified: Secondary | ICD-10-CM | POA: Diagnosis not present

## 2014-07-19 DIAGNOSIS — N189 Chronic kidney disease, unspecified: Secondary | ICD-10-CM | POA: Diagnosis not present

## 2014-07-19 DIAGNOSIS — G40802 Other epilepsy, not intractable, without status epilepticus: Secondary | ICD-10-CM | POA: Diagnosis not present

## 2014-07-19 DIAGNOSIS — I10 Essential (primary) hypertension: Secondary | ICD-10-CM | POA: Diagnosis not present

## 2014-07-19 DIAGNOSIS — E871 Hypo-osmolality and hyponatremia: Secondary | ICD-10-CM | POA: Diagnosis not present

## 2014-07-24 DIAGNOSIS — M1711 Unilateral primary osteoarthritis, right knee: Secondary | ICD-10-CM | POA: Diagnosis not present

## 2014-07-30 DIAGNOSIS — M1612 Unilateral primary osteoarthritis, left hip: Secondary | ICD-10-CM | POA: Diagnosis not present

## 2014-07-30 DIAGNOSIS — M25552 Pain in left hip: Secondary | ICD-10-CM | POA: Diagnosis not present

## 2014-07-31 IMAGING — CT CT L SPINE W/O CM
4 series · 15 of 33 positions shown, 18 images · non-contrast
Comparison: MRI of the lumbar spine 06/18/2013. Intraoperative
radiographs 07/24/2013.

CLINICAL DATA: New right lower extremity pain. Status post lumbar
surgery 1 week ago.

EXAM:
CT LUMBAR SPINE WITHOUT CONTRAST
TECHNIQUE: Multidetector CT imaging of the lumbar spine was performed without
intravenous contrast administration. Multiplanar CT image
reconstructions were also generated.

[Series 6: o-mar, soft tissue · axial · 0.36mm/px · z∈[-302,-134]mm · 5 of 128 slices shown, 7 images]
[im 22/128  soft-tissue]
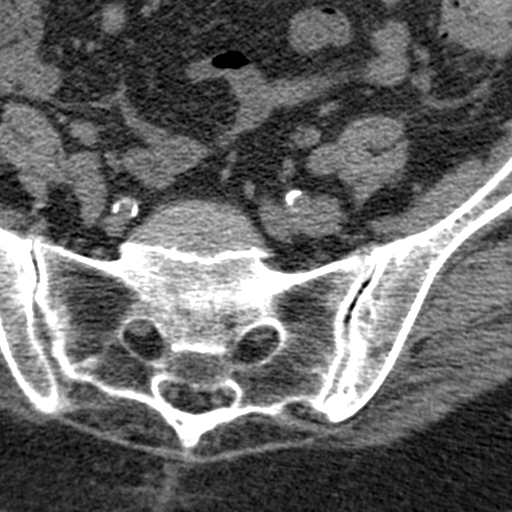
[im 22/128  bone]
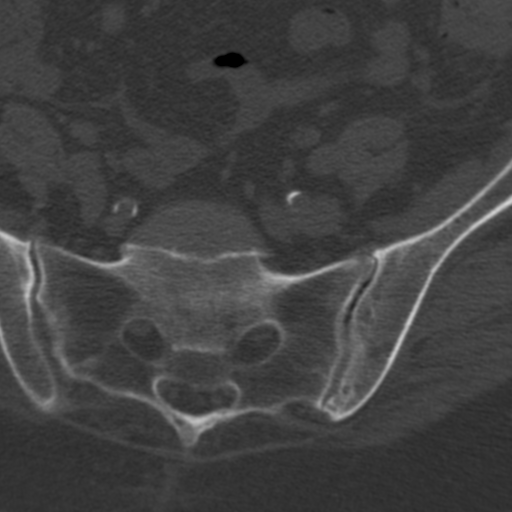
[im 43/128  bone]
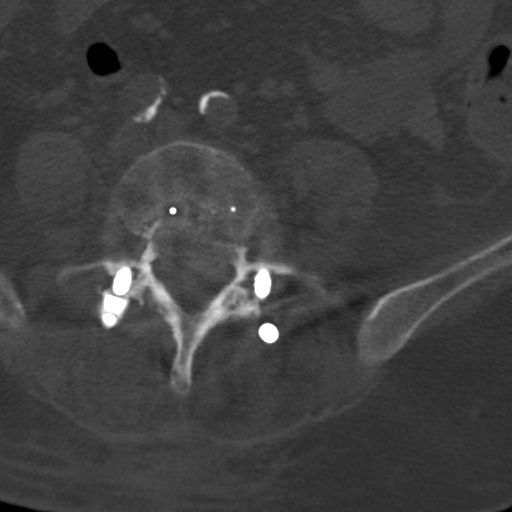
[im 64/128  bone]
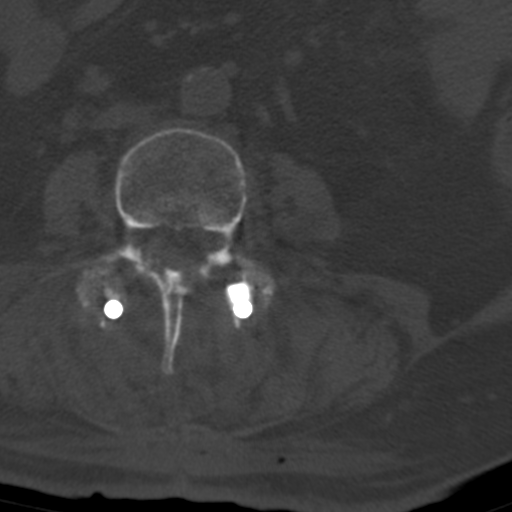
[im 85/128  bone]
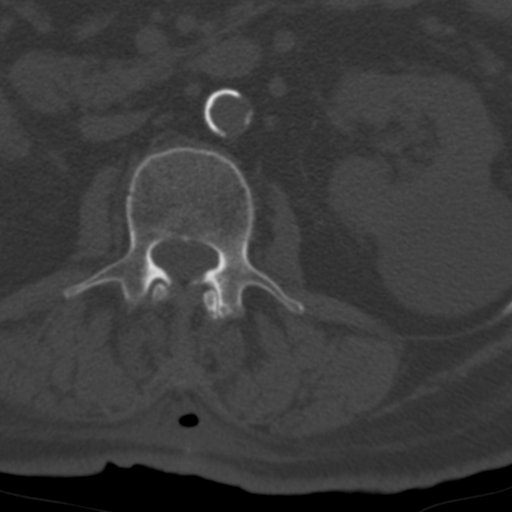
[im 106/128  soft-tissue]
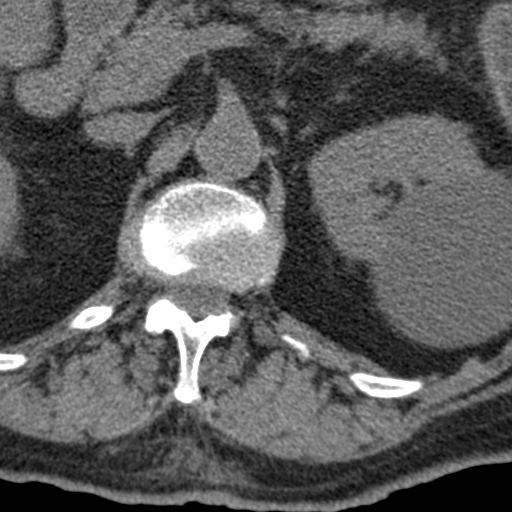
[im 106/128  bone]
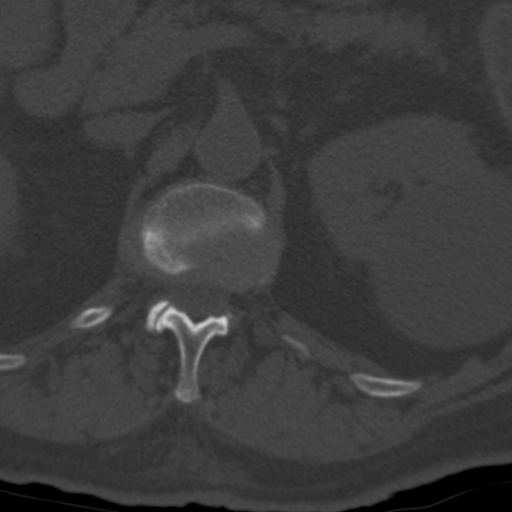

[Series 7: soft tissue · axial · 0.36mm/px · z∈[-302,-260]mm · 2 of 128 slices shown]
[im 22/128  soft-tissue]
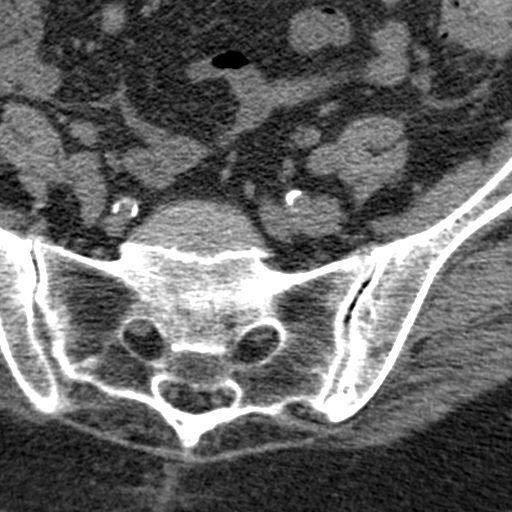
[im 43/128  soft-tissue]
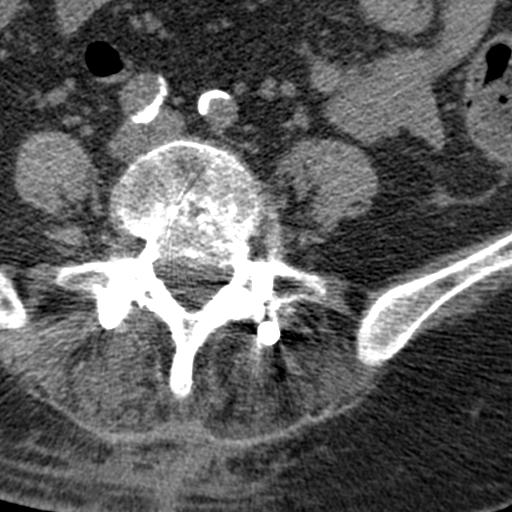

[o-mar, mpr, sagittal, sagittal · sagittal · 0.50mm/px · 5 of 59 slices shown, 6 images]
[im 20/59  bone]
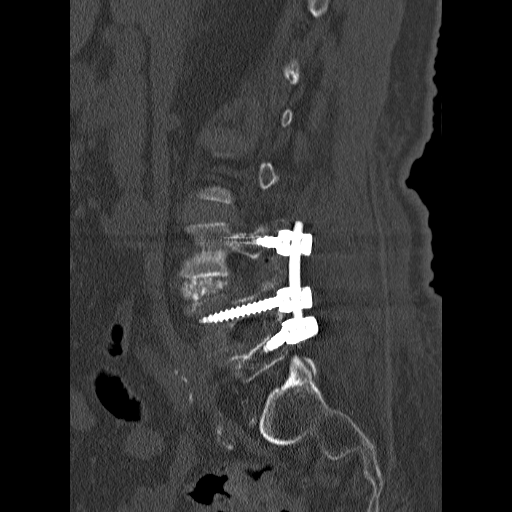
[im 25/59  bone]
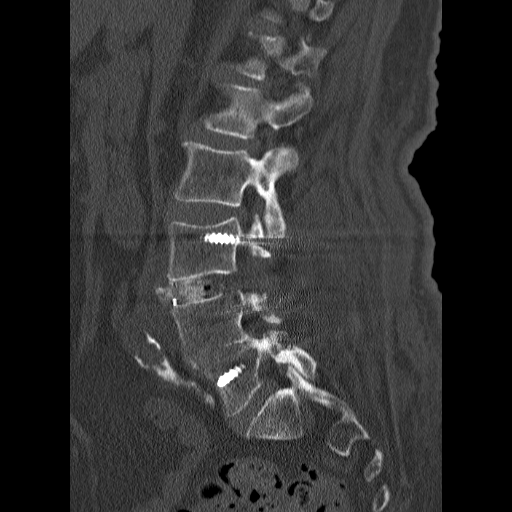
[im 30/59  soft-tissue]
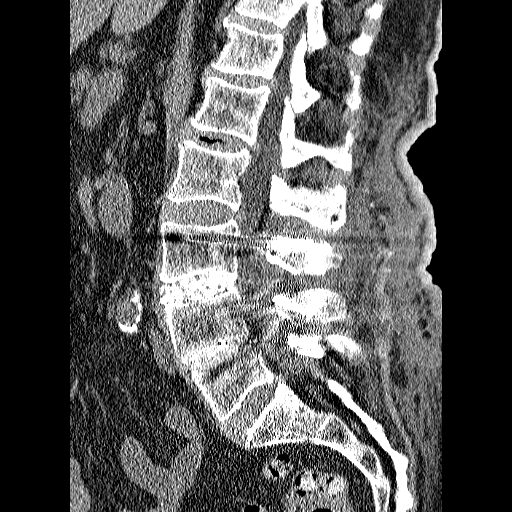
[im 30/59  bone]
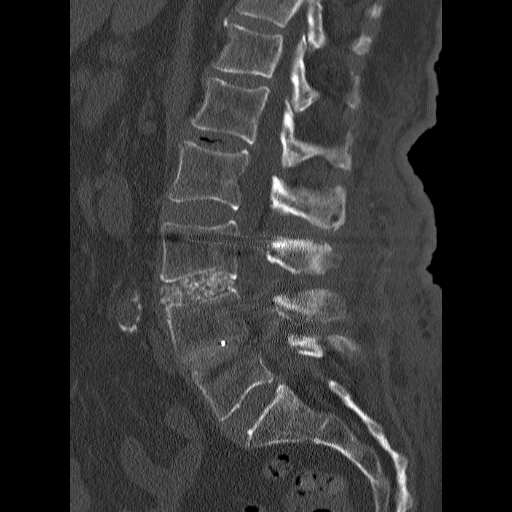
[im 34/59  bone]
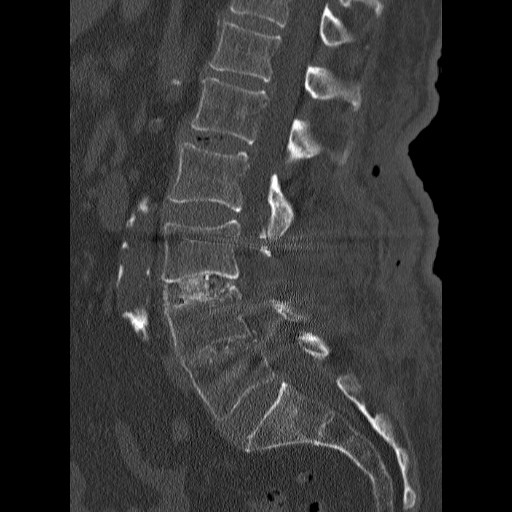
[im 39/59  bone]
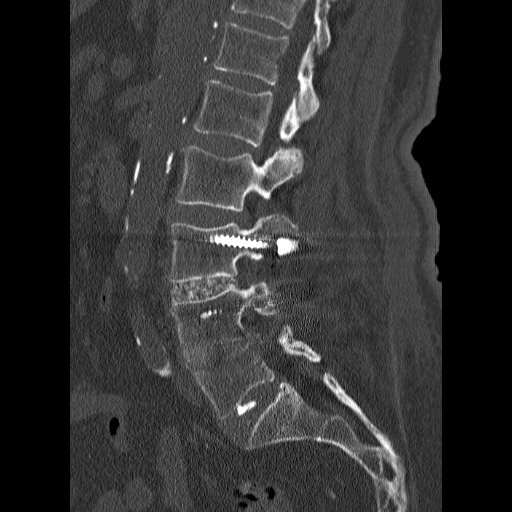

[o-mar, mpr, coronal, coronal · coronal · 0.50mm/px · 3 of 59 slices shown]
[im 12/59  bone]
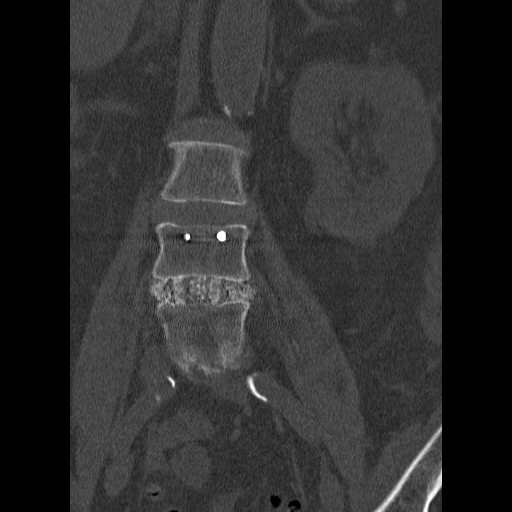
[im 24/59  bone]
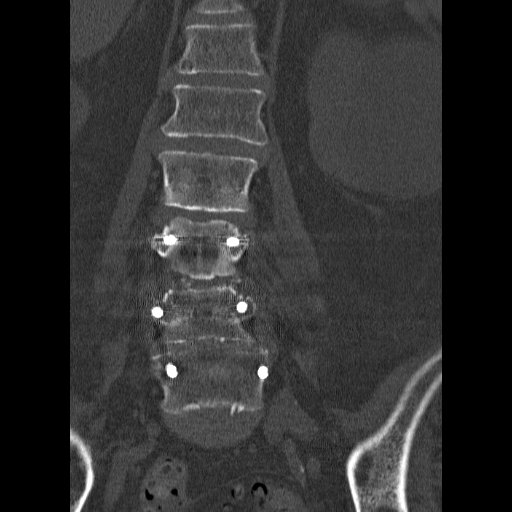
[im 35/59  bone]
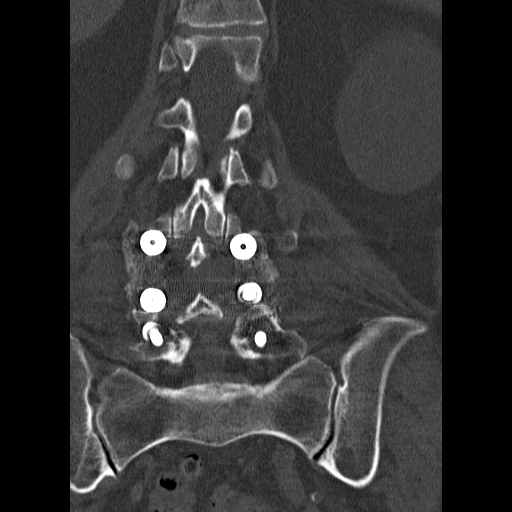

[15 of 33 positions shown; findings below may reference images not displayed]

FINDINGS: The patient is status post extension of a lumbar fusion the included
the L3-4 disc level. The patient underwent PLIF at L3-4.

The right pedicle screw at L3 extends to the medial more cortex
without reaching the cortex. The left pedicle screw is contained
within the bone. The L4 and L5 pedicle screws are unremarkable.

Degenerative changes are noted at the SI joints bilaterally.

Previous fusion at L4-5 is mature.

Vertebral body heights and alignment are maintained. Slight
rightward curvature of the lumbar spine persists, centered at L3.
There is a vacuum disc at L1-2.

The soft tissue windows demonstrate mild atherosclerotic changes in
the and abdominal aorta and branch vessels without aneurysm. A
cystic lesion is present in the left kidney. There is some stranding
about the left kidney.

Mild disc disease at T12-L1 and L1-2 is unchanged. Facet hypertrophy
is stable at these levels.

L2-3 disc herniation is present. Mild facet hypertrophy is noted
bilaterally. Mild central and foraminal narrowing is stable.

L3-4: The patient is status post wide laminectomy bilaterally.
Bilateral foramina automated were performed. No residual or
recurrent stenosis is evident.

L4-5:  Prior fusion at this level is stable.

L5-S1: A shallow central disc protrusion is present without
significant stenosis.
IMPRESSION: 1. Postoperative changes at L3-4 without evidence for the
significant stenosis or hardware complication.
2. Stable fusion at L4-5.
3. Stable mild disk disease at T12-L1, L1-2, and L2-3.

## 2014-09-04 DIAGNOSIS — M1711 Unilateral primary osteoarthritis, right knee: Secondary | ICD-10-CM | POA: Diagnosis not present

## 2014-09-04 DIAGNOSIS — M1712 Unilateral primary osteoarthritis, left knee: Secondary | ICD-10-CM | POA: Diagnosis not present

## 2014-09-09 DIAGNOSIS — I209 Angina pectoris, unspecified: Secondary | ICD-10-CM | POA: Diagnosis not present

## 2014-09-09 DIAGNOSIS — Z9861 Coronary angioplasty status: Secondary | ICD-10-CM | POA: Diagnosis not present

## 2014-10-14 DIAGNOSIS — I200 Unstable angina: Secondary | ICD-10-CM | POA: Diagnosis not present

## 2014-10-14 DIAGNOSIS — E871 Hypo-osmolality and hyponatremia: Secondary | ICD-10-CM | POA: Diagnosis not present

## 2014-10-14 DIAGNOSIS — N182 Chronic kidney disease, stage 2 (mild): Secondary | ICD-10-CM | POA: Diagnosis not present

## 2014-10-14 DIAGNOSIS — E782 Mixed hyperlipidemia: Secondary | ICD-10-CM | POA: Diagnosis not present

## 2014-10-14 DIAGNOSIS — F4320 Adjustment disorder, unspecified: Secondary | ICD-10-CM | POA: Diagnosis not present

## 2014-10-14 DIAGNOSIS — K219 Gastro-esophageal reflux disease without esophagitis: Secondary | ICD-10-CM | POA: Diagnosis not present

## 2014-10-14 DIAGNOSIS — M1991 Primary osteoarthritis, unspecified site: Secondary | ICD-10-CM | POA: Diagnosis not present

## 2014-10-14 DIAGNOSIS — I10 Essential (primary) hypertension: Secondary | ICD-10-CM | POA: Diagnosis not present

## 2014-10-14 DIAGNOSIS — G2581 Restless legs syndrome: Secondary | ICD-10-CM | POA: Diagnosis not present

## 2014-10-14 DIAGNOSIS — M4800 Spinal stenosis, site unspecified: Secondary | ICD-10-CM | POA: Diagnosis not present

## 2014-10-14 DIAGNOSIS — W06XXXS Fall from bed, sequela: Secondary | ICD-10-CM | POA: Diagnosis not present

## 2014-10-14 DIAGNOSIS — R2681 Unsteadiness on feet: Secondary | ICD-10-CM | POA: Diagnosis not present

## 2014-11-13 DIAGNOSIS — M1612 Unilateral primary osteoarthritis, left hip: Secondary | ICD-10-CM | POA: Diagnosis not present

## 2014-11-15 DIAGNOSIS — M1612 Unilateral primary osteoarthritis, left hip: Secondary | ICD-10-CM | POA: Diagnosis not present

## 2014-11-20 DIAGNOSIS — M79605 Pain in left leg: Secondary | ICD-10-CM | POA: Diagnosis not present

## 2014-11-20 DIAGNOSIS — M79604 Pain in right leg: Secondary | ICD-10-CM | POA: Diagnosis not present

## 2014-11-20 DIAGNOSIS — M791 Myalgia: Secondary | ICD-10-CM | POA: Diagnosis not present

## 2014-11-26 ENCOUNTER — Encounter: Payer: Self-pay | Admitting: Cardiology

## 2014-11-27 ENCOUNTER — Encounter: Payer: Self-pay | Admitting: *Deleted

## 2014-11-28 ENCOUNTER — Encounter: Payer: Self-pay | Admitting: Cardiology

## 2014-11-28 ENCOUNTER — Ambulatory Visit (INDEPENDENT_AMBULATORY_CARE_PROVIDER_SITE_OTHER): Payer: Medicare Other | Admitting: Cardiology

## 2014-11-28 VITALS — BP 174/79 | HR 60 | Ht 62.0 in | Wt 176.0 lb

## 2014-11-28 DIAGNOSIS — Z01810 Encounter for preprocedural cardiovascular examination: Secondary | ICD-10-CM | POA: Diagnosis not present

## 2014-11-28 DIAGNOSIS — I2510 Atherosclerotic heart disease of native coronary artery without angina pectoris: Secondary | ICD-10-CM

## 2014-11-28 DIAGNOSIS — K219 Gastro-esophageal reflux disease without esophagitis: Secondary | ICD-10-CM | POA: Diagnosis not present

## 2014-11-28 DIAGNOSIS — M79605 Pain in left leg: Secondary | ICD-10-CM | POA: Diagnosis not present

## 2014-11-28 DIAGNOSIS — N182 Chronic kidney disease, stage 2 (mild): Secondary | ICD-10-CM | POA: Diagnosis not present

## 2014-11-28 DIAGNOSIS — M1612 Unilateral primary osteoarthritis, left hip: Secondary | ICD-10-CM | POA: Diagnosis not present

## 2014-11-28 DIAGNOSIS — Z01818 Encounter for other preprocedural examination: Secondary | ICD-10-CM

## 2014-11-28 DIAGNOSIS — F328 Other depressive episodes: Secondary | ICD-10-CM | POA: Diagnosis not present

## 2014-11-28 DIAGNOSIS — M4800 Spinal stenosis, site unspecified: Secondary | ICD-10-CM | POA: Diagnosis not present

## 2014-11-28 DIAGNOSIS — R55 Syncope and collapse: Secondary | ICD-10-CM

## 2014-11-28 DIAGNOSIS — I10 Essential (primary) hypertension: Secondary | ICD-10-CM | POA: Diagnosis not present

## 2014-11-28 DIAGNOSIS — G2581 Restless legs syndrome: Secondary | ICD-10-CM | POA: Diagnosis not present

## 2014-11-28 DIAGNOSIS — R2681 Unsteadiness on feet: Secondary | ICD-10-CM | POA: Diagnosis not present

## 2014-11-28 DIAGNOSIS — M1991 Primary osteoarthritis, unspecified site: Secondary | ICD-10-CM | POA: Diagnosis not present

## 2014-11-28 DIAGNOSIS — G40802 Other epilepsy, not intractable, without status epilepticus: Secondary | ICD-10-CM | POA: Diagnosis not present

## 2014-11-28 DIAGNOSIS — F4320 Adjustment disorder, unspecified: Secondary | ICD-10-CM | POA: Diagnosis not present

## 2014-11-28 NOTE — Assessment & Plan Note (Signed)
There is history of syncope in the past. There has been no recurrent syncope or presyncope.

## 2014-11-28 NOTE — Assessment & Plan Note (Signed)
I have outlined the fact that the patient had significant lumbar surgery within the past year. She tolerated this well. Her coronary status is stable. She is not having any symptoms. I cannot assess her overall exercise capacity as she is limited by her hip pain. The patient looks and acts younger than her stated age. There is certainly increased risk for anyone having surgery at age 81. However there is no cardiac contraindication at this time. No further cardiac workup is needed. The patient is cleared for left hip replacement if she and Dr. Durward Fortes decided to proceed.

## 2014-11-28 NOTE — Progress Notes (Signed)
Cardiology Office Note   Date:  11/28/2014   ID:  Carrie Gillespie, DOB 05/13/1920, MRN [id number]  PCP:  Curlene Labrum, MD  Cardiologist:  Dola Argyle, MD   Chief Complaint  Patient presents with  . Appointment    Follow-up coronary artery disease. Preoperative clearance for hip surgery.      History of Present Illness: Carrie Gillespie is a 81 y.o. female who presents today to follow-up coronary disease. She is also here for cardiology preop clearance for hip surgery. The patient's cardiac status has been stable. She does have known coronary disease. Her nuclear study was negative in 2012. Within the past year she had significant surgery done on her spine. She tolerated this well. Unfortunately she has severe discomfort in her left hip. Her quality of life is very poor with this pain. She is hoping to have this fixed. This is being considered between her and Dr. Durward Fortes.  She is not having any chest pain. Her exercise level is limited because of her hip pain.    Past Medical History  Diagnosis Date  . Peripheral vascular disease, unspecified     Minimal internal carotid artery disease  . Unspecified essential hypertension   . Other and unspecified hyperlipidemia   . Coronary atherosclerosis of native coronary artery     Status post stent to right coronary 2004, catheterization 2005 nonobstructive CAD, normal LV function, Cardiolite study 2008 negative for ischemia. Residual moderate ostial RCA disease. Stress testing 2012 negative for ischemia ejection fraction 78%  . Carotid artery disease     with an occluded right vertebral artery, otherwise nonobstructive carotid artery disease  . Nerve entrapment syndrome of lower extremity      status post prior back surgery and weakness in left leg  . Ejection fraction     LV function normal by nuclear,  2012  . Arthritis   . Seizures   . Ejection fraction     Past Surgical History  Procedure Laterality Date  . Kidney surgery   2010    1/4 of right kidney removed [facility]  . Cardiac catheterization      nonobstructive coronary artery disease  . Cataract extraction    . Knee arthroplasty Left 2004  . Partial hysterectomy    . Coronary angioplasty  1998-1999    Patient Active Problem List   Diagnosis Date Noted  . Nausea with vomiting 07/29/2013  . UTI (urinary tract infection) 07/29/2013  . Bradycardia 07/29/2013  . Status post lumbar laminectomy 07/29/2013  . Syncope 07/25/2013  . Ejection fraction   . Otitis externa 12/16/2011  . Neuropathic pain of lower extremity 12/16/2011  . Nerve entrapment syndrome of lower extremity   . Hyponatremia 06/21/2011  . Insomnia 06/21/2011  . Peripheral vascular disease, unspecified   . Coronary atherosclerosis of native coronary artery   . Carotid artery disease   . HYPERLIPIDEMIA-MIXED 05/23/2009  . HYPERTENSION, UNSPECIFIED 05/23/2009      Current Outpatient Prescriptions  Medication Sig Dispense Refill  . aspirin EC 81 MG tablet Take 81 mg by mouth daily.      . carbamazepine (TEGRETOL XR) 200 MG 12 hr tablet Take 200 mg by mouth daily.      . chlorthalidone (HYGROTON) 25 MG tablet Take 1 tablet (25 mg total) by mouth daily. 90 tablet 3  . isosorbide mononitrate (IMDUR) 120 MG 24 hr tablet Take 120 mg by mouth daily.    Marland Kitchen lisinopril (PRINIVIL,ZESTRIL) 10 MG tablet Take 10 mg by  mouth daily.     . metoprolol succinate (TOPROL-XL) 50 MG 24 hr tablet Take 50 mg by mouth daily. Take with or immediately following a meal.    . Multiple Vitamins-Minerals (MULTIVITAL) tablet Take 1 tablet by mouth daily.      . Multiple Vitamins-Minerals (PRESERVISION AREDS PO) Take 1 tablet by mouth daily.    . nitroGLYCERIN (NITROSTAT) 0.4 MG SL tablet Place 0.4 mg under the tongue every 5 (five) minutes as needed for chest pain.     Marland Kitchen omeprazole (PRILOSEC) 20 MG capsule Take 20 mg by mouth daily.      . pravastatin (PRAVACHOL) 40 MG tablet Take 40 mg by mouth daily.     No  current facility-administered medications for this visit.    Allergies:   Review of patient's allergies indicates no known allergies.    Social History:  The patient  reports that she has never smoked. She has never used smokeless tobacco. She reports that she does not drink alcohol or use illicit drugs.   Family History:  The patient's family history includes Diabetes in her other; Hypertension in her other.    ROS:  Please see the history of present illness.     Patient denies fever, chills, headache, sweats, rash, change in vision, change in hearing, chest pain, cough, nausea or vomiting, urinary symptoms. All other systems are reviewed and are negative.   PHYSICAL EXAM: VS:  BP 174/79 mmHg  Pulse 60  Ht 5\' 2"  (1.575 m)  Wt 176 lb (79.833 kg)  BMI 32.18 kg/m2  SpO2 97% , The patient is stable. She is here with her son. She is oriented to person time and place. Affect is normal. She looks younger than her stated age. Head is atraumatic. Sclera and conjunctiva are normal. There is no jugulovenous distention. Lungs are clear. Respiratory effort is nonlabored. Cardiac exam reveals S1 and S2. The abdomen is soft. There is no peripheral edema. There are no musculoskeletal deformities. There are no skin rashes.  EKG:   EKG is done today and reviewed by me. There is no significant change from the past. She has decreased R-wave in V2. This is old.   Recent Labs: No results found for requested labs within last 365 days.    Lipid Panel No results found for: CHOL, TRIG, HDL, CHOLHDL, VLDL, LDLCALC, LDLDIRECT    Wt Readings from Last 3 Encounters:  11/28/14 176 lb (79.833 kg)  01/23/14 174 lb 12.8 oz (79.289 kg)  07/24/13 179 lb 3.7 oz (81.3 kg)      Current medicines are reviewed  The patient understands her medications well.     ASSESSMENT AND PLAN:

## 2014-11-28 NOTE — Patient Instructions (Signed)

## 2014-11-28 NOTE — Assessment & Plan Note (Signed)
The patient has known coronary disease. She had a Cardiolite scan in 2012 that was negative for ischemia. She has normal left ventricular function. She is not having any significant symptoms. She is on appropriate medications. No further workup.

## 2014-12-04 DIAGNOSIS — M1612 Unilateral primary osteoarthritis, left hip: Secondary | ICD-10-CM | POA: Diagnosis not present

## 2014-12-10 ENCOUNTER — Inpatient Hospital Stay (HOSPITAL_COMMUNITY): Admission: RE | Admit: 2014-12-10 | Payer: Medicare Other | Source: Ambulatory Visit

## 2014-12-17 ENCOUNTER — Ambulatory Visit (INDEPENDENT_AMBULATORY_CARE_PROVIDER_SITE_OTHER): Payer: Medicare Other | Admitting: Ophthalmology

## 2014-12-18 ENCOUNTER — Encounter (HOSPITAL_COMMUNITY): Payer: Self-pay

## 2014-12-18 ENCOUNTER — Encounter (HOSPITAL_COMMUNITY)
Admission: RE | Admit: 2014-12-18 | Discharge: 2014-12-18 | Disposition: A | Discharge status: Home / Self Care | Payer: Medicare Other | Source: Ambulatory Visit | Attending: Orthopaedic Surgery | Admitting: Orthopaedic Surgery

## 2014-12-18 ENCOUNTER — Ambulatory Visit (HOSPITAL_COMMUNITY)
Admission: RE | Admit: 2014-12-18 | Discharge: 2014-12-18 | Disposition: A | Discharge status: Home / Self Care | Payer: Medicare Other | Source: Ambulatory Visit | Attending: Orthopedic Surgery | Admitting: Orthopedic Surgery

## 2014-12-18 DIAGNOSIS — Z01811 Encounter for preprocedural respiratory examination: Secondary | ICD-10-CM | POA: Insufficient documentation

## 2014-12-18 DIAGNOSIS — Z01818 Encounter for other preprocedural examination: Secondary | ICD-10-CM

## 2014-12-18 DIAGNOSIS — M1612 Unilateral primary osteoarthritis, left hip: Secondary | ICD-10-CM | POA: Diagnosis not present

## 2014-12-18 HISTORY — DX: Personal history of other medical treatment: Z92.89

## 2014-12-18 HISTORY — DX: Unspecified macular degeneration: H35.30

## 2014-12-18 HISTORY — DX: Pain in unspecified joint: M25.50

## 2014-12-18 HISTORY — DX: Major depressive disorder, single episode, unspecified: F32.9

## 2014-12-18 HISTORY — DX: Gastro-esophageal reflux disease without esophagitis: K21.9

## 2014-12-18 HISTORY — DX: Depression, unspecified: F32.A

## 2014-12-18 HISTORY — DX: Personal history of other diseases of the respiratory system: Z87.09

## 2014-12-18 HISTORY — DX: Diverticulosis of intestine, part unspecified, without perforation or abscess without bleeding: K57.90

## 2014-12-18 HISTORY — DX: Polyneuropathy, unspecified: G62.9

## 2014-12-18 LAB — COMPREHENSIVE METABOLIC PANEL
ALBUMIN: 3.8 g/dL (ref 3.5–5.2)
ALT: 18 U/L (ref 0–35)
ANION GAP: 9 (ref 5–15)
AST: 18 U/L (ref 0–37)
Alkaline Phosphatase: 67 U/L (ref 39–117)
BUN: 18 mg/dL (ref 6–23)
CALCIUM: 9.2 mg/dL (ref 8.4–10.5)
CO2: 29 mmol/L (ref 19–32)
Chloride: 91 mmol/L — ABNORMAL LOW (ref 96–112)
Creatinine, Ser: 0.85 mg/dL (ref 0.50–1.10)
GFR calc Af Amer: 67 mL/min — ABNORMAL LOW (ref >90)
GFR calc non Af Amer: 58 mL/min — ABNORMAL LOW (ref >90)
GLUCOSE: 78 mg/dL (ref 70–99)
Potassium: 4.5 mmol/L (ref 3.5–5.1)
SODIUM: 129 mmol/L — AB (ref 135–145)
Total Bilirubin: 0.5 mg/dL (ref 0.3–1.2)
Total Protein: 6.9 g/dL (ref 6.0–8.3)

## 2014-12-18 LAB — CBC WITH DIFFERENTIAL/PLATELET
BASOS ABS: 0 10*3/uL (ref 0.0–0.1)
Basophils Relative: 1 % (ref 0–1)
EOS ABS: 0.3 10*3/uL (ref 0.0–0.7)
EOS PCT: 4 % (ref 0–5)
HEMATOCRIT: 33.4 % — AB (ref 36.0–46.0)
HEMOGLOBIN: 11.1 g/dL — AB (ref 12.0–15.0)
Lymphocytes Relative: 40 % (ref 12–46)
Lymphs Abs: 3.1 10*3/uL (ref 0.7–4.0)
MCH: 30.2 pg (ref 26.0–34.0)
MCHC: 33.2 g/dL (ref 30.0–36.0)
MCV: 90.8 fL (ref 78.0–100.0)
MONO ABS: 0.7 10*3/uL (ref 0.1–1.0)
Monocytes Relative: 10 % (ref 3–12)
Neutro Abs: 3.6 10*3/uL (ref 1.7–7.7)
Neutrophils Relative %: 45 % (ref 43–77)
Platelets: 269 10*3/uL (ref 150–400)
RBC: 3.68 MIL/uL — AB (ref 3.87–5.11)
RDW: 13.7 % (ref 11.5–15.5)
WBC: 7.7 10*3/uL (ref 4.0–10.5)

## 2014-12-18 LAB — PROTIME-INR
INR: 1 (ref 0.00–1.49)
Prothrombin Time: 13.3 seconds (ref 11.6–15.2)

## 2014-12-18 LAB — URINALYSIS, ROUTINE W REFLEX MICROSCOPIC
Bilirubin Urine: NEGATIVE
Glucose, UA: NEGATIVE mg/dL
Hgb urine dipstick: NEGATIVE
KETONES UR: NEGATIVE mg/dL
Nitrite: NEGATIVE
PROTEIN: NEGATIVE mg/dL
Specific Gravity, Urine: 1.024 (ref 1.005–1.030)
UROBILINOGEN UA: 0.2 mg/dL (ref 0.0–1.0)
pH: 6 (ref 5.0–8.0)

## 2014-12-18 LAB — URINE MICROSCOPIC-ADD ON

## 2014-12-18 LAB — SURGICAL PCR SCREEN
MRSA, PCR: NEGATIVE
Staphylococcus aureus: NEGATIVE

## 2014-12-18 LAB — APTT: aPTT: 33 seconds (ref 24–37)

## 2014-12-18 LAB — TYPE AND SCREEN
ABO/RH(D): A POS
Antibody Screen: NEGATIVE

## 2014-12-18 MED ORDER — CHLORHEXIDINE GLUCONATE 4 % EX LIQD: 60.0000 mL | Freq: Once | CUTANEOUS | Status: DC

## 2014-12-18 NOTE — Pre-Procedure Instructions (Signed)
Carrie Gillespie  12/18/2014   Your procedure is scheduled on:  Thurs, Mar 31 @ 11:30 AM  Report to [facility]  at 9:30 AM.  Call this number if you have problems the morning of surgery: [phone]   Remember:   Do not eat food or drink liquids after midnight.   Take these medicines the morning of surgery with A SIP OF WATER: Tegretol(Carbamazepine),Cymbalta(Duloxetine),Pain Pill(if needed),Imdur(Isosorbide),Metoprlol(Toprol),and Omeprazole(Prilosec)              Stop taking your Aspirin and any Vitamins. No Goody's,BC's,Aleve,Ibuprofen,Fish Oil,or any Herbal Medications.    Do not wear jewelry, make-up or nail polish.  Do not wear lotions, powders, or perfumes. You may wear deodorant.  Do not shave 48 hours prior to surgery.   Do not bring valuables to the hospital.  [facility] is not responsible                  for any belongings or valuables.               Contacts, dentures or bridgework may not be worn into surgery.  Leave suitcase in the car. After surgery it may be brought to your room.  For patients admitted to the hospital, discharge time is determined by your                treatment team.             [facility] - Preparing for Surgery  Before surgery, you can play an important role.  Because skin is not sterile, your skin needs to be as free of germs as possible.  You can reduce the number of germs on you skin by washing with CHG (chlorahexidine gluconate) soap before surgery.  CHG is an antiseptic cleaner which kills germs and bonds with the skin to continue killing germs even after washing.  Please DO NOT use if you have an allergy to CHG or antibacterial soaps.  If your skin becomes reddened/irritated stop using the CHG and inform your nurse when you arrive at Short Stay.  Do not shave (including legs and underarms) for at least 48 hours prior to the first CHG shower.  You may shave your face.  Please follow these instructions carefully:   1.  Shower with  CHG Soap the night before surgery and the                                morning of Surgery.  2.  If you choose to wash your hair, wash your hair first as usual with your       normal shampoo.  3.  After you shampoo, rinse your hair and body thoroughly to remove the                      Shampoo.  4.  Use CHG as you would any other liquid soap.  You can apply chg directly       to the skin and wash gently with scrungie or a clean washcloth.  5.  Apply the CHG Soap to your body ONLY FROM THE NECK DOWN.        Do not use on open wounds or open sores.  Avoid contact with your eyes,       ears, mouth and genitals (private parts).  Wash genitals (private parts)       with your  normal soap.  6.  Wash thoroughly, paying special attention to the area where your surgery        will be performed.  7.  Thoroughly rinse your body with warm water from the neck down.  8.  DO NOT shower/wash with your normal soap after using and rinsing off       the CHG Soap.  9.  Pat yourself dry with a clean towel.            10.  Wear clean pajamas.            11.  Place clean sheets on your bed the night of your first shower and do not        sleep with pets.  Day of Surgery  Do not apply any lotions/deoderants the morning of surgery.  Please wear clean clothes to the hospital/surgery center.    Special Instructions:    Please read over the following fact sheets that you were given: Pain Booklet, Coughing and Deep Breathing, Blood Transfusion Information, MRSA Information and Surgical Site Infection Prevention

## 2014-12-18 NOTE — Progress Notes (Signed)
Cardiologist is Dr.Katz with last visit in epic from 11-28-14  EKG in epic from 11-28-14  Echo report in epic from 2014  Stress test in epic from 2001  Medical Md is Dr.Burdine

## 2014-12-19 ENCOUNTER — Encounter (HOSPITAL_COMMUNITY): Payer: Self-pay

## 2014-12-19 DIAGNOSIS — E871 Hypo-osmolality and hyponatremia: Secondary | ICD-10-CM | POA: Diagnosis not present

## 2014-12-19 DIAGNOSIS — Z01818 Encounter for other preprocedural examination: Secondary | ICD-10-CM | POA: Diagnosis not present

## 2014-12-19 NOTE — H&P (Signed)
CHIEF COMPLAINT:  Carrie Gillespie is a very pleasant 81 year old white female who is seen today for evaluation of her left hip.     HISTORY OF PRESENT ILLNESS:  She has had significant pain and discomfort in the left hip for many years.  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  She has been tried with several injections of cortisone with initial results that were good, however, it is now coming to the point where her injections have not been very good.  They have not been helpful and she is now having to use a walker.  She is so miserable that she feels that she cannot continue to do her activities of daily living, because of this.  She had reached a point where at her last visit that she stated that she "is ready for the Lord to take her" if she is going to continue to have this much pain and discomfort.  Her pain is extremely severe, constant, aching with catching.  She states that she cannot go on living like this.  She is seen today for evaluation.     PAST MEDICAL HISTORY:  In general her health is good.    PAST SURGICAL HISTORY/HOSPITALIZATION:   1.  In 1966 vaginal hysterectomy. 2.  In 1999 cardiac stent.  3.  In 2005 left total knee replacement by Dr. Amado Coe with continued pain. 4.  In 2005 lumbar spine surgery.  5.  In 2014 lumbar spine surgery. 6.  Cataracts x2.     CURRENT MEDICATIONS:  1.  Aspirin 81 mg daily. 2.  Equate Allergy Relief 20 mg daily.  3.  Tegretol 200 mg daily for mini-seizures.  4.  Isosorbide mono ER 120 mg daily.  5.  Ibuprofen 600 mg daily.  6.  Lisinopril 10 mg daily.  7.  Metoprolol 50 mg daily. 8.  Pravastatin 40 mg at bedtime. 9.  Prilosec 20.6 mg b.i.d.  10.  Multivitamin. 11.  PreserVision AREDS. 12.  Duloxetine 60 mg daily.   13.  Lortab 7.5/325 p.r.n.  14.  Oxycodone 7.5/325 p.r.n. pain.    ALLERGIES:  Latex.    REVIEW OF SYSTEMS:  A 14 point  review of systems is positive for glasses and decreased hearing.  She has had cataract surgery bilaterally.  She denies any chest pain, but does have heart disease with a stent.  No strokes.  She has had hypertension prior to her medication, but now it is normal.  She has had mini-seizures in the past and was on Tegretol.  She stopped it and then unfortunately had a grand mal seizure and therefore she put herself back on that.  She does have a history of depression.     FAMILY HISTORY:  Positive for her mother who is deceased at age 29 from colon cancer.  She had heart disease and diabetes.  She had a father who was killed at a very young age from a motor vehicle accident.  She has one living brother at age 67.  She has had one brother, at age 71, deceased as he was dialysis and had pancreatic carcinoma.  She had also one other brother who died in his 47s from lung cancer with metastatic disease.  She has had two sisters and one is deceased at age 3 and apparently was on dialysis.  She has one sister who spent nine years on dialysis and  is age 39.    SOCIAL HISTORY:  She is a 81 year old white widowed female.  She is a retired [employer] manager.  She denies the use of tobacco or alcohol.     PHYSICAL EXAMINATION:  In general she is a 81 year old white female, well developed, well nourished, alert, pleasant and cooperative in moderate distress secondary to left hip pain.     Her height is 62 inches and weight 169 pounds.  BMI is 30.9.   Vital signs:  Temperature 97.4, pulse 68, respiration 16, blood pressure 126/72.   Head is normocephalic.  Eyes:  Pupils equal, round and reactive to light and accommodation.  Extraocular movements intact.  Ears, nose and throat were benign.   Chest had good expansion. Lungs had decreased breath sounds, but were clear.  Cardiac:  Regular rhythm and rate.  Occasional ectopic. A grade [id number] systolic murmur barely palpable with markedly distant heart sounds noted.  Pulses were  trace to 1+ bilateral and symmetric in dorsalis pedis bilaterally.   Abdomen:  Obese, soft, nontender and no masses palpable.  Normal bowel sounds.  Genital, rectal and breast exam are not indicated for an orthopedic evaluation.  CNS:  She is oriented x3 and cranial nerves II-XII grossly intact.  Musculoskeletal:  At this time she refuses any motion of her left hip.  She can sit comfortably to 90 degrees of flexion and I get through about 100 degrees, however, I cannot get much of any internal and external rotation secondary to pain.     RADIOGRAPHS:  The radiographic studies reveal bone on bone OA of the left hip with marked periarticular cysts with sclerosing of the acetabulum and femoral head superiorly.  She does have periarticular spurring about the femoral head.     CLINICAL IMPRESSION:   1.  End-stage OA of the left hip.  2.  Near end-stage OA of the right hip.  3.  History of mini-seizures.   4.  History of cardiac stent and atherosclerotic heart disease.   5.  Latex allergy.    RECOMMENDATIONS:  At this time I have reviewed clearance forms from Dr. Ron Parker who feels that she is a candidate for surgery.  She is at moderate risk for the surgery, but again no absolute contraindication.  Dr. Pleas Koch, also from a medical and cardiac standpoint feels that she certainly would be at risk for surgery, but because she states there are no other alternatives he will go with clearing.     Therefore we will proceed with left total hip replacement.  The procedure, risks and benefits were fully explained to her and her family.  I used appropriate prosthesis and trials to show her what will be implanted.  All questions were answered.    Mike Craze [place], [place] [phone]  12/19/2014 11:16 AM

## 2014-12-19 NOTE — Progress Notes (Signed)
Anesthesia Chart Review: Patient is a 81 year old female scheduled for left THA on 12/26/14 by Dr. Durward Fortes.  History includes non-smoker, HTN, HLD, CAD s/p mid RCA stent '00, arthritis, right partial nephrectomy [facility], '10), mild carotid stenosis 0-39% in '14, GERD, seizures, depression, peripheral neuropathy, macular degeneration, left TKA '04, partial hysterectomy, L3-4 PLIF '14. Patient lives in [place], [place]. PCP is listed as Dr. Judd Lien. Cardiologist is Dr. Dola Argyle, last visit 11/28/14.  He felt she was at increased risk for surgery due to age, but her cardiac status is stable and therefore he felt there was no cardiac contraindication for hip surgery.  No further cardiac testing ordered.    EKG on 11/27/1608/20/14 showed SB at 55 bpm, old anteroseptal infarct. No change.   06/13/14 Echo ([facility]; Dr. Hamilton Capri at [facility]): LV is normal in size, wall thickness, and wall motion with EF of 60-65%. LV diastolic function is abnormal. LA/RA mildly dilated. Mild 1+ MR. Pulmonary hypertension is not suggested by Doppler findings. Mild aortic sclerosis is present with good valvular opening.   Nuclear stress test on 05/25/11 [facility]; scanned under Media tab 07/24/13 Encounter) showed probably normal LV perfusion. Limitations and artifact were due to breast tissue. Mild, apical anteroseptal defect that is fixed, suggestive of soft tissue attenuation. No large area os ischemia. Calculated post stress LVEF was 78%.  Her last cardiac cath was on 09/06/03 and showed normal LV systolic function, patent mid RCA stent with less than 20% stenosis, 70-75% ostial stenosis of the RCA, 30% proximal RCA. Normal LM. 40% proximal LAD, 25% mid LAD, 40% ostial D1. LCx with small OM and very large ramus with 30% proximal stenosis.  07/29/13 Carotid duplex: Right: moderate intimal wall thickening CCA. Mild mixed plaque origin ICA. Left: mild sof tplaque distal CCA. Mild calcific  plaque origin ICA with acoustic shadowing. Bilateral: 1-39% ICA stenosis. Vertebral artery flow is antegrade. ICA/CCA ratio: R-0.59 L-1.0.  12/17/13 CXR: Chronic bronchitic changes. No active disease.  Preoperative labs noted. Na 129. (Historically, labs have shown hyponatremia in the past, 126-130 in 06/2013. She is on chlorthalidone.) Cr 0.85. H/H 11.1/33.4. PT/PTT WNL. T&S done.   Her cardiologist is aware of plans for surgery.  Since her Na is < 130, I'll order an ISTAT4 to recheck for stability.  If labs are acceptable and otherwise no acute changes then I would anticipate that she could proceed as planned.  George Hugh [facility] Short Stay Center/Anesthesiology Phone [phone] 12/19/2014 12:13 PM

## 2014-12-23 MED ORDER — ACETAMINOPHEN 10 MG/ML IV SOLN: 1000.0000 mg | Freq: Once | INTRAVENOUS | Status: DC

## 2014-12-23 MED ORDER — SODIUM CHLORIDE 0.9 % IV SOLN: INTRAVENOUS | Status: DC

## 2014-12-23 MED ORDER — CEFAZOLIN SODIUM-DEXTROSE 2-3 GM-% IV SOLR
2.0000 g | INTRAVENOUS | Status: AC
  Administered 2014-12-24: 2 g via INTRAVENOUS
  Filled 2014-12-23: qty 50

## 2014-12-23 NOTE — Progress Notes (Signed)
Patient notified to arrive at 12 noon. Verbalized understanding.

## 2014-12-24 ENCOUNTER — Inpatient Hospital Stay (HOSPITAL_COMMUNITY)
Admission: RE | Admit: 2014-12-24 | Discharge: 2014-12-27 | DRG: 470 | Disposition: A | Discharge status: Skilled Nursing Facility | Payer: Medicare Other | Source: Ambulatory Visit | Attending: Orthopaedic Surgery | Admitting: Orthopaedic Surgery

## 2014-12-24 ENCOUNTER — Inpatient Hospital Stay (HOSPITAL_COMMUNITY): Payer: Medicare Other

## 2014-12-24 ENCOUNTER — Encounter (HOSPITAL_COMMUNITY): Payer: Self-pay | Admitting: Anesthesiology

## 2014-12-24 ENCOUNTER — Inpatient Hospital Stay (HOSPITAL_COMMUNITY): Payer: Medicare Other | Admitting: Vascular Surgery

## 2014-12-24 ENCOUNTER — Inpatient Hospital Stay (HOSPITAL_COMMUNITY): Payer: Medicare Other | Admitting: Anesthesiology

## 2014-12-24 ENCOUNTER — Encounter (HOSPITAL_COMMUNITY)
Admission: RE | Disposition: A | Discharge status: Skilled Nursing Facility | Payer: Self-pay | Source: Ambulatory Visit | Attending: Orthopaedic Surgery

## 2014-12-24 DIAGNOSIS — R569 Unspecified convulsions: Secondary | ICD-10-CM | POA: Diagnosis not present

## 2014-12-24 DIAGNOSIS — Z833 Family history of diabetes mellitus: Secondary | ICD-10-CM | POA: Diagnosis not present

## 2014-12-24 DIAGNOSIS — Z79899 Other long term (current) drug therapy: Secondary | ICD-10-CM | POA: Diagnosis not present

## 2014-12-24 DIAGNOSIS — D62 Acute posthemorrhagic anemia: Secondary | ICD-10-CM | POA: Diagnosis not present

## 2014-12-24 DIAGNOSIS — H409 Unspecified glaucoma: Secondary | ICD-10-CM | POA: Diagnosis not present

## 2014-12-24 DIAGNOSIS — Z91040 Latex allergy status: Secondary | ICD-10-CM

## 2014-12-24 DIAGNOSIS — R262 Difficulty in walking, not elsewhere classified: Secondary | ICD-10-CM | POA: Diagnosis not present

## 2014-12-24 DIAGNOSIS — Z955 Presence of coronary angioplasty implant and graft: Secondary | ICD-10-CM | POA: Diagnosis not present

## 2014-12-24 DIAGNOSIS — M160 Bilateral primary osteoarthritis of hip: Principal | ICD-10-CM | POA: Diagnosis present

## 2014-12-24 DIAGNOSIS — G629 Polyneuropathy, unspecified: Secondary | ICD-10-CM | POA: Diagnosis present

## 2014-12-24 DIAGNOSIS — Z7982 Long term (current) use of aspirin: Secondary | ICD-10-CM | POA: Diagnosis not present

## 2014-12-24 DIAGNOSIS — K219 Gastro-esophageal reflux disease without esophagitis: Secondary | ICD-10-CM | POA: Diagnosis present

## 2014-12-24 DIAGNOSIS — F329 Major depressive disorder, single episode, unspecified: Secondary | ICD-10-CM | POA: Diagnosis present

## 2014-12-24 DIAGNOSIS — E871 Hypo-osmolality and hyponatremia: Secondary | ICD-10-CM | POA: Diagnosis not present

## 2014-12-24 DIAGNOSIS — Z8249 Family history of ischemic heart disease and other diseases of the circulatory system: Secondary | ICD-10-CM

## 2014-12-24 DIAGNOSIS — Z471 Aftercare following joint replacement surgery: Secondary | ICD-10-CM | POA: Diagnosis not present

## 2014-12-24 DIAGNOSIS — M25552 Pain in left hip: Secondary | ICD-10-CM | POA: Diagnosis not present

## 2014-12-24 DIAGNOSIS — I2510 Atherosclerotic heart disease of native coronary artery without angina pectoris: Secondary | ICD-10-CM | POA: Diagnosis present

## 2014-12-24 DIAGNOSIS — M6281 Muscle weakness (generalized): Secondary | ICD-10-CM | POA: Diagnosis not present

## 2014-12-24 DIAGNOSIS — E784 Other hyperlipidemia: Secondary | ICD-10-CM | POA: Diagnosis present

## 2014-12-24 DIAGNOSIS — Z96652 Presence of left artificial knee joint: Secondary | ICD-10-CM | POA: Diagnosis present

## 2014-12-24 DIAGNOSIS — Z9889 Other specified postprocedural states: Secondary | ICD-10-CM

## 2014-12-24 DIAGNOSIS — I10 Essential (primary) hypertension: Secondary | ICD-10-CM | POA: Diagnosis present

## 2014-12-24 DIAGNOSIS — Z96649 Presence of unspecified artificial hip joint: Secondary | ICD-10-CM

## 2014-12-24 DIAGNOSIS — I739 Peripheral vascular disease, unspecified: Secondary | ICD-10-CM | POA: Diagnosis not present

## 2014-12-24 DIAGNOSIS — E785 Hyperlipidemia, unspecified: Secondary | ICD-10-CM | POA: Diagnosis not present

## 2014-12-24 DIAGNOSIS — K5900 Constipation, unspecified: Secondary | ICD-10-CM | POA: Diagnosis not present

## 2014-12-24 DIAGNOSIS — M1612 Unilateral primary osteoarthritis, left hip: Secondary | ICD-10-CM | POA: Diagnosis not present

## 2014-12-24 DIAGNOSIS — H3530 Unspecified macular degeneration: Secondary | ICD-10-CM | POA: Diagnosis present

## 2014-12-24 DIAGNOSIS — Z96642 Presence of left artificial hip joint: Secondary | ICD-10-CM | POA: Diagnosis not present

## 2014-12-24 DIAGNOSIS — K5790 Diverticulosis of intestine, part unspecified, without perforation or abscess without bleeding: Secondary | ICD-10-CM | POA: Diagnosis not present

## 2014-12-24 HISTORY — PX: TOTAL HIP ARTHROPLASTY: SHX124

## 2014-12-24 LAB — POCT I-STAT 4, (NA,K, GLUC, HGB,HCT)
Glucose, Bld: 109 mg/dL — ABNORMAL HIGH (ref 70–99)
HCT: 41 % (ref 36.0–46.0)
Hemoglobin: 13.9 g/dL (ref 12.0–15.0)
Potassium: 4.6 mmol/L (ref 3.5–5.1)
Sodium: 132 mmol/L — ABNORMAL LOW (ref 135–145)

## 2014-12-24 SURGERY — ARTHROPLASTY, HIP, TOTAL,POSTERIOR APPROACH
Anesthesia: Monitor Anesthesia Care | Site: Hip | Laterality: Left

## 2014-12-24 MED ORDER — PROPOFOL 10 MG/ML IV BOLUS
INTRAVENOUS | Status: AC
  Filled 2014-12-24: qty 20

## 2014-12-24 MED ORDER — ALUM & MAG HYDROXIDE-SIMETH 200-200-20 MG/5ML PO SUSP: 30.0000 mL | ORAL | Status: DC | PRN

## 2014-12-24 MED ORDER — PROPOFOL INFUSION 10 MG/ML OPTIME
INTRAVENOUS | Status: DC | PRN
  Administered 2014-12-24: 75 ug/kg/min via INTRAVENOUS

## 2014-12-24 MED ORDER — HYDROCODONE-ACETAMINOPHEN 7.5-325 MG PO TABS
1.0000 | ORAL_TABLET | Freq: Four times a day (QID) | ORAL | Status: DC | PRN
  Administered 2014-12-24: 1 via ORAL
  Filled 2014-12-24: qty 1

## 2014-12-24 MED ORDER — PHENOL 1.4 % MT LIQD: 1.0000 | OROMUCOSAL | Status: DC | PRN

## 2014-12-24 MED ORDER — DOCUSATE SODIUM 100 MG PO CAPS
100.0000 mg | ORAL_CAPSULE | Freq: Two times a day (BID) | ORAL | Status: DC
  Administered 2014-12-24 – 2014-12-27 (×6): 100 mg via ORAL
  Filled 2014-12-24 (×5): qty 1

## 2014-12-24 MED ORDER — ACETAMINOPHEN 650 MG RE SUPP: 650.0000 mg | Freq: Four times a day (QID) | RECTAL | Status: DC | PRN

## 2014-12-24 MED ORDER — LIDOCAINE HCL (CARDIAC) 20 MG/ML IV SOLN
INTRAVENOUS | Status: AC
  Filled 2014-12-24: qty 5

## 2014-12-24 MED ORDER — SODIUM CHLORIDE 0.9 % IV SOLN
75.0000 mL/h | INTRAVENOUS | Status: DC
  Administered 2014-12-24: 75 mL/h via INTRAVENOUS

## 2014-12-24 MED ORDER — BOOST / RESOURCE BREEZE PO LIQD
1.0000 | Freq: Three times a day (TID) | ORAL | Status: DC
  Administered 2014-12-24 – 2014-12-27 (×5): 1 via ORAL

## 2014-12-24 MED ORDER — RIVAROXABAN 10 MG PO TABS
10.0000 mg | ORAL_TABLET | Freq: Every day | ORAL | Status: DC
  Administered 2014-12-25: 10 mg via ORAL
  Filled 2014-12-24: qty 1

## 2014-12-24 MED ORDER — ACETAMINOPHEN 10 MG/ML IV SOLN
INTRAVENOUS | Status: AC
  Administered 2014-12-24: 1000 mg
  Filled 2014-12-24: qty 100

## 2014-12-24 MED ORDER — ISOSORBIDE MONONITRATE ER 60 MG PO TB24
120.0000 mg | ORAL_TABLET | Freq: Every day | ORAL | Status: DC
  Administered 2014-12-25 – 2014-12-27 (×3): 120 mg via ORAL
  Filled 2014-12-24 (×3): qty 2

## 2014-12-24 MED ORDER — ONDANSETRON HCL 4 MG/2ML IJ SOLN
4.0000 mg | Freq: Four times a day (QID) | INTRAMUSCULAR | Status: DC | PRN
  Administered 2014-12-25 – 2014-12-26 (×2): 4 mg via INTRAVENOUS
  Filled 2014-12-24 (×2): qty 2

## 2014-12-24 MED ORDER — ONDANSETRON HCL 4 MG PO TABS: 4.0000 mg | ORAL_TABLET | Freq: Four times a day (QID) | ORAL | Status: DC | PRN

## 2014-12-24 MED ORDER — BUPIVACAINE-EPINEPHRINE (PF) 0.25% -1:200000 IJ SOLN
INTRAMUSCULAR | Status: AC
  Filled 2014-12-24: qty 30

## 2014-12-24 MED ORDER — DIPHENHYDRAMINE HCL 12.5 MG/5ML PO ELIX: 12.5000 mg | ORAL_SOLUTION | ORAL | Status: DC | PRN

## 2014-12-24 MED ORDER — DIPHENHYDRAMINE HCL 25 MG PO TABS: 25.0000 mg | ORAL_TABLET | Freq: Four times a day (QID) | ORAL | Status: DC | PRN

## 2014-12-24 MED ORDER — PANTOPRAZOLE SODIUM 40 MG PO TBEC
40.0000 mg | DELAYED_RELEASE_TABLET | Freq: Every day | ORAL | Status: DC
  Administered 2014-12-25 – 2014-12-26 (×2): 40 mg via ORAL
  Filled 2014-12-24 (×2): qty 1

## 2014-12-24 MED ORDER — HYDROMORPHONE HCL 1 MG/ML IJ SOLN
0.5000 mg | INTRAMUSCULAR | Status: DC | PRN
  Administered 2014-12-24 – 2014-12-27 (×7): 0.5 mg via INTRAVENOUS
  Filled 2014-12-24 (×7): qty 1

## 2014-12-24 MED ORDER — FENTANYL CITRATE 0.05 MG/ML IJ SOLN
INTRAMUSCULAR | Status: AC
  Filled 2014-12-24: qty 5

## 2014-12-24 MED ORDER — FENTANYL CITRATE 0.05 MG/ML IJ SOLN
INTRAMUSCULAR | Status: AC
  Filled 2014-12-24: qty 2

## 2014-12-24 MED ORDER — ONDANSETRON HCL 4 MG/2ML IJ SOLN
INTRAMUSCULAR | Status: AC
  Filled 2014-12-24: qty 2

## 2014-12-24 MED ORDER — CEFAZOLIN SODIUM 1-5 GM-% IV SOLN
1.0000 g | Freq: Four times a day (QID) | INTRAVENOUS | Status: AC
  Administered 2014-12-24 – 2014-12-25 (×2): 1 g via INTRAVENOUS
  Filled 2014-12-24 (×2): qty 50

## 2014-12-24 MED ORDER — LACTATED RINGERS IV SOLN
INTRAVENOUS | Status: DC | PRN
  Administered 2014-12-24 (×2): via INTRAVENOUS

## 2014-12-24 MED ORDER — KETOROLAC TROMETHAMINE 15 MG/ML IJ SOLN
INTRAMUSCULAR | Status: AC
  Filled 2014-12-24: qty 1

## 2014-12-24 MED ORDER — METOCLOPRAMIDE HCL 5 MG/ML IJ SOLN: 5.0000 mg | Freq: Three times a day (TID) | INTRAMUSCULAR | Status: DC | PRN

## 2014-12-24 MED ORDER — PROPOFOL 10 MG/ML IV BOLUS
INTRAVENOUS | Status: DC | PRN
  Administered 2014-12-24: 20 mg via INTRAVENOUS

## 2014-12-24 MED ORDER — LACTATED RINGERS IV SOLN
INTRAVENOUS | Status: DC
  Administered 2014-12-24: 12:00:00 via INTRAVENOUS

## 2014-12-24 MED ORDER — ONDANSETRON HCL 4 MG/2ML IJ SOLN: 4.0000 mg | Freq: Once | INTRAMUSCULAR | Status: DC | PRN

## 2014-12-24 MED ORDER — DULOXETINE HCL 60 MG PO CPEP
60.0000 mg | ORAL_CAPSULE | Freq: Every day | ORAL | Status: DC
  Administered 2014-12-25 – 2014-12-27 (×3): 60 mg via ORAL
  Filled 2014-12-24 (×3): qty 1

## 2014-12-24 MED ORDER — MENTHOL 3 MG MT LOZG: 1.0000 | LOZENGE | OROMUCOSAL | Status: DC | PRN

## 2014-12-24 MED ORDER — SODIUM CHLORIDE 0.9 % IR SOLN
Status: DC | PRN
  Administered 2014-12-24: 1000 mL

## 2014-12-24 MED ORDER — CARBAMAZEPINE ER 200 MG PO TB12
200.0000 mg | ORAL_TABLET | Freq: Every day | ORAL | Status: DC
  Administered 2014-12-25 – 2014-12-27 (×3): 200 mg via ORAL
  Filled 2014-12-24 (×3): qty 1

## 2014-12-24 MED ORDER — MAGNESIUM HYDROXIDE 400 MG/5ML PO SUSP: 30.0000 mL | Freq: Every day | ORAL | Status: DC | PRN

## 2014-12-24 MED ORDER — ROCURONIUM BROMIDE 50 MG/5ML IV SOLN
INTRAVENOUS | Status: AC
  Filled 2014-12-24: qty 1

## 2014-12-24 MED ORDER — LISINOPRIL 10 MG PO TABS
10.0000 mg | ORAL_TABLET | Freq: Every day | ORAL | Status: DC
  Administered 2014-12-25 – 2014-12-27 (×3): 10 mg via ORAL
  Filled 2014-12-24 (×3): qty 1

## 2014-12-24 MED ORDER — PRAVASTATIN SODIUM 40 MG PO TABS
40.0000 mg | ORAL_TABLET | Freq: Every day | ORAL | Status: DC
  Administered 2014-12-25 – 2014-12-26 (×2): 40 mg via ORAL
  Filled 2014-12-24 (×2): qty 1

## 2014-12-24 MED ORDER — METOCLOPRAMIDE HCL 5 MG PO TABS: 5.0000 mg | ORAL_TABLET | Freq: Three times a day (TID) | ORAL | Status: DC | PRN

## 2014-12-24 MED ORDER — BUPIVACAINE IN DEXTROSE 0.75-8.25 % IT SOLN
INTRATHECAL | Status: DC | PRN
  Administered 2014-12-24: 1.2 mL via INTRATHECAL

## 2014-12-24 MED ORDER — METOPROLOL SUCCINATE ER 50 MG PO TB24
50.0000 mg | ORAL_TABLET | Freq: Every day | ORAL | Status: DC
  Administered 2014-12-25 – 2014-12-27 (×3): 50 mg via ORAL
  Filled 2014-12-24 (×3): qty 1

## 2014-12-24 MED ORDER — BISACODYL 10 MG RE SUPP: 10.0000 mg | Freq: Every day | RECTAL | Status: DC | PRN

## 2014-12-24 MED ORDER — OXYCODONE-ACETAMINOPHEN 5-325 MG PO TABS
1.0000 | ORAL_TABLET | ORAL | Status: DC | PRN
  Administered 2014-12-24 – 2014-12-26 (×9): 1 via ORAL
  Filled 2014-12-24 (×10): qty 1

## 2014-12-24 MED ORDER — FENTANYL CITRATE 0.05 MG/ML IJ SOLN
INTRAMUSCULAR | Status: DC | PRN
  Administered 2014-12-24: 50 ug via INTRAVENOUS
  Administered 2014-12-24: 25 ug via INTRAVENOUS
  Administered 2014-12-24 (×3): 50 ug via INTRAVENOUS
  Administered 2014-12-24: 25 ug via INTRAVENOUS

## 2014-12-24 MED ORDER — ONDANSETRON HCL 4 MG/2ML IJ SOLN
INTRAMUSCULAR | Status: DC | PRN
  Administered 2014-12-24: 4 mg via INTRAVENOUS

## 2014-12-24 MED ORDER — MORPHINE SULFATE 2 MG/ML IJ SOLN
1.0000 mg | INTRAMUSCULAR | Status: DC | PRN
  Administered 2014-12-24: 1 mg via INTRAVENOUS
  Filled 2014-12-24: qty 1

## 2014-12-24 MED ORDER — MAGNESIUM CITRATE PO SOLN: 1.0000 | Freq: Once | ORAL | Status: AC | PRN

## 2014-12-24 MED ORDER — FENTANYL CITRATE 0.05 MG/ML IJ SOLN
25.0000 ug | INTRAMUSCULAR | Status: DC | PRN
  Administered 2014-12-24: 50 ug via INTRAVENOUS

## 2014-12-24 MED ORDER — BUPIVACAINE-EPINEPHRINE (PF) 0.25% -1:200000 IJ SOLN
INTRAMUSCULAR | Status: DC | PRN
  Administered 2014-12-24: 10 mL

## 2014-12-24 MED ORDER — NITROGLYCERIN 0.4 MG SL SUBL: 0.4000 mg | SUBLINGUAL_TABLET | SUBLINGUAL | Status: DC | PRN

## 2014-12-24 MED ORDER — KETOROLAC TROMETHAMINE 15 MG/ML IJ SOLN
7.5000 mg | Freq: Four times a day (QID) | INTRAMUSCULAR | Status: AC
  Administered 2014-12-24 – 2014-12-25 (×4): 7.5 mg via INTRAVENOUS
  Filled 2014-12-24 (×3): qty 1

## 2014-12-24 MED ORDER — ACETAMINOPHEN 325 MG PO TABS: 650.0000 mg | ORAL_TABLET | Freq: Four times a day (QID) | ORAL | Status: DC | PRN

## 2014-12-24 SURGICAL SUPPLY — 69 items
BLADE SAW SAG 73X25 THK (BLADE) ×1
BLADE SAW SGTL 73X25 THK (BLADE) ×1 IMPLANT
BRUSH FEMORAL CANAL (MISCELLANEOUS) IMPLANT
CAPT HIP TOTAL 2 ×1 IMPLANT
COVER BACK TABLE 24X17X13 BIG (DRAPES) IMPLANT
COVER SURGICAL LIGHT HANDLE (MISCELLANEOUS) ×2 IMPLANT
DRAPE IMP U-DRAPE 54X76 (DRAPES) ×2 IMPLANT
DRAPE INCISE IOBAN 66X45 STRL (DRAPES) IMPLANT
DRAPE ORTHO SPLIT 77X108 STRL (DRAPES) ×4
DRAPE SURG ORHT 6 SPLT 77X108 (DRAPES) ×2 IMPLANT
DRSG MEPILEX BORDER 4X12 (GAUZE/BANDAGES/DRESSINGS) ×1 IMPLANT
DRSG MEPILEX BORDER 4X8 (GAUZE/BANDAGES/DRESSINGS) ×1 IMPLANT
DURAPREP 26ML APPLICATOR (WOUND CARE) ×4 IMPLANT
ELECT BLADE 4.0 EZ CLEAN MEGAD (MISCELLANEOUS) ×2
ELECT BLADE 6.5 EXT (BLADE) IMPLANT
ELECT REM PT RETURN 9FT ADLT (ELECTROSURGICAL) ×2
ELECTRODE BLDE 4.0 EZ CLN MEGD (MISCELLANEOUS) IMPLANT
ELECTRODE REM PT RTRN 9FT ADLT (ELECTROSURGICAL) ×1 IMPLANT
EVACUATOR 1/8 PVC DRAIN (DRAIN) IMPLANT
FACESHIELD WRAPAROUND (MASK) ×4 IMPLANT
FACESHIELD WRAPAROUND OR TEAM (MASK) ×3 IMPLANT
GLOVE BIOGEL PI IND STRL 8 (GLOVE) ×2 IMPLANT
GLOVE BIOGEL PI IND STRL 8.5 (GLOVE) ×1 IMPLANT
GLOVE BIOGEL PI INDICATOR 8 (GLOVE) ×2
GLOVE BIOGEL PI INDICATOR 8.5 (GLOVE) ×1
GLOVE ECLIPSE 8.0 STRL XLNG CF (GLOVE) ×2 IMPLANT
GLOVE SURG ORTHO 8.5 STRL (GLOVE) ×4 IMPLANT
GLOVE SURG SS PI 6.5 STRL IVOR (GLOVE) ×2 IMPLANT
GLOVE SURG SS PI 7.5 STRL IVOR (GLOVE) ×3 IMPLANT
GLOVE SURG SS PI 8.0 STRL IVOR (GLOVE) ×2 IMPLANT
GLOVE SURG SS PI 8.5 STRL IVOR (GLOVE) ×2
GLOVE SURG SS PI 8.5 STRL STRW (GLOVE) IMPLANT
GOWN STRL REUS W/ TWL LRG LVL3 (GOWN DISPOSABLE) ×2 IMPLANT
GOWN STRL REUS W/TWL 2XL LVL3 (GOWN DISPOSABLE) ×4 IMPLANT
GOWN STRL REUS W/TWL LRG LVL3 (GOWN DISPOSABLE) ×4
HANDPIECE INTERPULSE COAX TIP (DISPOSABLE)
IMMOBILIZER KNEE 20 (SOFTGOODS) IMPLANT
IMMOBILIZER KNEE 22 UNIV (SOFTGOODS) ×1 IMPLANT
IMMOBILIZER KNEE 24 THIGH 36 (MISCELLANEOUS) IMPLANT
IMMOBILIZER KNEE 24 UNIV (MISCELLANEOUS)
KIT BASIN OR (CUSTOM PROCEDURE TRAY) ×2 IMPLANT
KIT ROOM TURNOVER OR (KITS) ×2 IMPLANT
MANIFOLD NEPTUNE II (INSTRUMENTS) ×2 IMPLANT
NEEDLE 22X1 1/2 (OR ONLY) (NEEDLE) ×2 IMPLANT
NS IRRIG 1000ML POUR BTL (IV SOLUTION) ×2 IMPLANT
PACK TOTAL JOINT (CUSTOM PROCEDURE TRAY) ×2 IMPLANT
PACK UNIVERSAL I (CUSTOM PROCEDURE TRAY) ×2 IMPLANT
PAD ARMBOARD 7.5X6 YLW CONV (MISCELLANEOUS) ×4 IMPLANT
PRESSURIZER FEMORAL UNIV (MISCELLANEOUS) IMPLANT
SET HNDPC FAN SPRY TIP SCT (DISPOSABLE) IMPLANT
SPONGE LAP 18X18 X RAY DECT (DISPOSABLE) ×1 IMPLANT
STAPLER VISISTAT 35W (STAPLE) ×2 IMPLANT
SUCTION FRAZIER TIP 10 FR DISP (SUCTIONS) ×2 IMPLANT
SUT BONE WAX W31G (SUTURE) IMPLANT
SUT ETHIBOND NAB CT1 #1 30IN (SUTURE) ×6 IMPLANT
SUT MNCRL AB 3-0 PS2 18 (SUTURE) ×2 IMPLANT
SUT VIC AB 0 CT1 27 (SUTURE) ×4
SUT VIC AB 0 CT1 27XBRD ANBCTR (SUTURE) ×2 IMPLANT
SUT VIC AB 1 CT1 27 (SUTURE) ×4
SUT VIC AB 1 CT1 27XBRD ANBCTR (SUTURE) ×2 IMPLANT
SUT VIC AB 2-0 CT1 27 (SUTURE) ×2
SUT VIC AB 2-0 CT1 TAPERPNT 27 (SUTURE) ×1 IMPLANT
SYR CONTROL 10ML LL (SYRINGE) ×2 IMPLANT
TOWEL OR 17X24 6PK STRL BLUE (TOWEL DISPOSABLE) ×2 IMPLANT
TOWEL OR 17X26 10 PK STRL BLUE (TOWEL DISPOSABLE) ×2 IMPLANT
TOWER CARTRIDGE SMART MIX (DISPOSABLE) IMPLANT
TRAY FOLEY BAG SILVER LF 16FR (CATHETERS) ×1 IMPLANT
TRAY FOLEY CATH 16FRSI W/METER (SET/KITS/TRAYS/PACK) ×2 IMPLANT
WATER STERILE IRR 1000ML POUR (IV SOLUTION) ×5 IMPLANT

## 2014-12-24 NOTE — Anesthesia Preprocedure Evaluation (Signed)
Anesthesia Evaluation  Patient identified by MRN, date of birth, ID band Patient awake    Reviewed: Allergy & Precautions, NPO status , Patient's Chart, lab work & pertinent test results  Airway Mallampati: III   Neck ROM: full    Dental   Pulmonary neg pulmonary ROS,  breath sounds clear to auscultation        Cardiovascular hypertension, + CAD, + Cardiac Stents and + Peripheral Vascular Disease Rhythm:regular Rate:Normal  Cleared by cardiologist.   Neuro/Psych Seizures -,  Depression  Neuromuscular disease    GI/Hepatic GERD-  ,  Endo/Other    Renal/GU      Musculoskeletal  (+) Arthritis -,   Abdominal   Peds  Hematology   Anesthesia Other Findings   Reproductive/Obstetrics                             Anesthesia Physical Anesthesia Plan  ASA: III  Anesthesia Plan: MAC and Spinal   Post-op Pain Management:    Induction: Intravenous  Airway Management Planned: Simple Face Mask  Additional Equipment:   Intra-op Plan:   Post-operative Plan:   Informed Consent: I have reviewed the patients History and Physical, chart, labs and discussed the procedure including the risks, benefits and alternatives for the proposed anesthesia with the patient or authorized representative who has indicated his/her understanding and acceptance.     Plan Discussed with: CRNA, Anesthesiologist and Surgeon  Anesthesia Plan Comments:         Anesthesia Quick Evaluation

## 2014-12-24 NOTE — H&P (Signed)
  The recent History & Physical has been reviewed. I have personally examined the patient today. There is no interval change to the documented History & Physical. The patient would like to proceed with the procedure.  [name] W 12/24/2014,  1:41 PM

## 2014-12-24 NOTE — Transfer of Care (Signed)
Immediate Anesthesia Transfer of Care Note  Patient: Carrie Gillespie  Procedure(s) Performed: Procedure(s): TOTAL HIP ARTHROPLASTY (Left)  Patient Location: PACU  Anesthesia Type:MAC and Spinal  Level of Consciousness: awake, alert , oriented and patient cooperative  Airway & Oxygen Therapy: Patient Spontanous Breathing and Patient connected to nasal cannula oxygen  Post-op Assessment: Report given to RN and Post -op Vital signs reviewed and stable  Post vital signs: Reviewed and stable  Last Vitals:  Filed Vitals:   12/24/14 1209  BP: 224/98  Pulse: 85  Temp: 36.7 C  Resp: 20    Complications: No apparent anesthesia complications

## 2014-12-24 NOTE — Anesthesia Procedure Notes (Signed)
Spinal Patient location during procedure: OR Start time: 12/24/2014 1:58 PM End time: 12/24/2014 2:05 PM Staffing Anesthesiologist: HODIERNE, ADAM Performed by: anesthesiologist  Preanesthetic Checklist Completed: patient identified, site marked, surgical consent, pre-op evaluation, timeout performed, IV checked, risks and benefits discussed and monitors and equipment checked Spinal Block Patient position: sitting Prep: Betadine Patient monitoring: heart rate, cardiac monitor, continuous pulse ox and blood pressure Approach: midline Location: L3-4 Injection technique: single-shot Needle Needle type: Pencan  Needle gauge: 24 G Needle length: 10 cm Assessment Sensory level: T10 Additional Notes Pt tolerated the procedure well.

## 2014-12-24 NOTE — Progress Notes (Signed)
Patient ID: [name], female   DOB: Jun 10, 1920, 81 y.o.   MRN: [id number] PATIENT ID:      Carrie Gillespie  MRN:     [id number] DOB/AGE:    Oct 03, 1919 / 81 y.o.       OPERATIVE REPORT    DATE OF PROCEDURE:  12/24/2014       PREOPERATIVE DIAGNOSIS:   END STAGE PRIMARY OSTEOARTHRITIS OF LEFT HIP                                                       Estimated body mass index is 32.18 kg/(m^2) as calculated from the following:   Height as of 12/18/14: 5\' 2"  (1.575 m).   Weight as of 11/28/14: 79.833 kg (176 lb).     POSTOPERATIVE DIAGNOSIS:   END STAGE PRIMARY OSTEOARTHRITIS OF LEFT HIP                                                                     Estimated body mass index is 32.18 kg/(m^2) as calculated from the following:   Height as of 12/18/14: 5\' 2"  (1.575 m).   Weight as of 11/28/14: 79.833 kg (176 lb).     PROCEDURE:  Procedure(s): TOTAL HIP ARTHROPLASTY      SURGEON:  [name], MD    ASSISTANT:   Biagio Borg, PA-C   (Present and scrubbed throughout the case, critical for assistance with exposure, retraction, instrumentation, and closure.)          ANESTHESIA: spinal and IV sedation     DRAINS: none :      TOURNIQUET TIME: * No tourniquets in log *    COMPLICATIONS:  None   CONDITION:  stable  PROCEDURE IN DETAIL: [id number]   [name] W 12/24/2014, 3:44 PM

## 2014-12-24 NOTE — Progress Notes (Signed)
Patient ID: Carrie Gillespie, female   DOB: [birth year], 81 y.o.   MRN: [id number] Call by nursing, apparently no orders for narcotic pain medication ordered post op. Message forwarded to Dr. Durward Fortes, no response. Will start on Morphine sulfate 1 mg every 2 hours as she is 81 years old. Also percocet 5mg /325mg  every 4 hours if needed.  Ice packs also help .

## 2014-12-24 NOTE — Anesthesia Postprocedure Evaluation (Signed)
  Anesthesia Post-op Note  Patient: Carrie Gillespie  Procedure(s) Performed: Procedure(s): TOTAL HIP ARTHROPLASTY (Left)  Patient Location: PACU  Anesthesia Type: MAC, Spinal   Level of Consciousness: awake, alert  and oriented  Airway and Oxygen Therapy: Patient Spontanous Breathing  Post-op Pain: mild  Post-op Assessment: Post-op Vital signs reviewed  Post-op Vital Signs: Reviewed  Last Vitals:  Filed Vitals:   12/24/14 1715  BP: 179/66  Pulse: 78  Temp: 36.4 C  Resp: 12    Complications: No apparent anesthesia complications

## 2014-12-25 ENCOUNTER — Encounter (HOSPITAL_COMMUNITY): Payer: Self-pay | Admitting: Orthopaedic Surgery

## 2014-12-25 LAB — BASIC METABOLIC PANEL
ANION GAP: 4 — AB (ref 5–15)
BUN: 15 mg/dL (ref 6–23)
CHLORIDE: 96 mmol/L (ref 96–112)
CO2: 32 mmol/L (ref 19–32)
Calcium: 8.8 mg/dL (ref 8.4–10.5)
Creatinine, Ser: 0.94 mg/dL (ref 0.50–1.10)
GFR calc Af Amer: 59 mL/min — ABNORMAL LOW (ref >90)
GFR calc non Af Amer: 51 mL/min — ABNORMAL LOW (ref >90)
GLUCOSE: 131 mg/dL — AB (ref 70–99)
Potassium: 4.4 mmol/L (ref 3.5–5.1)
Sodium: 132 mmol/L — ABNORMAL LOW (ref 135–145)

## 2014-12-25 LAB — CBC
HCT: 30.9 % — ABNORMAL LOW (ref 36.0–46.0)
Hemoglobin: 10 g/dL — ABNORMAL LOW (ref 12.0–15.0)
MCH: 30.1 pg (ref 26.0–34.0)
MCHC: 32.4 g/dL (ref 30.0–36.0)
MCV: 93.1 fL (ref 78.0–100.0)
Platelets: 216 10*3/uL (ref 150–400)
RBC: 3.32 MIL/uL — ABNORMAL LOW (ref 3.87–5.11)
RDW: 14 % (ref 11.5–15.5)
WBC: 11.1 10*3/uL — ABNORMAL HIGH (ref 4.0–10.5)

## 2014-12-25 MED ORDER — ASPIRIN EC 325 MG PO TBEC
325.0000 mg | DELAYED_RELEASE_TABLET | Freq: Every day | ORAL | Status: DC
  Administered 2014-12-26 – 2014-12-27 (×2): 325 mg via ORAL
  Filled 2014-12-25 (×2): qty 1

## 2014-12-25 NOTE — Progress Notes (Signed)
Patient ID: Carrie Gillespie, female   DOB: 04-29-1920, 81 y.o.   MRN: [id number] PATIENT ID: Carrie Gillespie        MRN:  [id number]          DOB/AGE: 04/14/1920 / 81 y.o.    Carrie Fears, MD   Biagio Borg, PA-C [address] [place], [place]  [address]                             [phone]   PROGRESS NOTE  Subjective:  negative for Chest Pain  negative for Shortness of Breath  negative for Nausea/Vomiting   negative for Calf Pain    Tolerating Diet: yes         Patient reports pain as 5 on 0-10 scale.     Difficult night controlling pain, better this am  Objective: Vital signs in last 24 hours:   Patient Vitals for the past 24 hrs:  BP Temp Temp src Pulse Resp SpO2  12/25/14 0418 (!) 151/50 mmHg 98.3 F (36.8 C) - 92 16 94 %  12/25/14 0400 - - - - 16 99 %  12/25/14 0057 (!) 133/50 mmHg 97.6 F (36.4 C) - 89 16 99 %  12/25/14 0000 - - - - 16 99 %  12/24/14 2100 134/67 mmHg 97.7 F (36.5 C) - 87 14 99 %  12/24/14 2000 - - - - 16 99 %  12/24/14 1803 (!) 179/66 mmHg 97.6 F (36.4 C) Oral 78 - 100 %  12/24/14 1715 (!) 179/66 mmHg 97.6 F (36.4 C) Oral 78 12 100 %  12/24/14 1700 (!) 173/72 mmHg - - 73 15 100 %  12/24/14 1645 (!) 178/60 mmHg 97.5 F (36.4 C) - 70 12 100 %  12/24/14 1630 (!) 183/89 mmHg - - 68 12 100 %  12/24/14 1615 (!) 127/50 mmHg - - 87 12 (!) 65 %  12/24/14 1611 (!) 127/50 mmHg 97.6 F (36.4 C) - 71 11 99 %  12/24/14 1209 (!) 224/98 mmHg 98 F (36.7 C) - 85 20 98 %      Intake/Output from previous day:   03/29 0701 - 03/30 0700 In: 2042.5 [I.V.:2042.5] Out: 795 [Urine:575]   Intake/Output this shift:       Intake/Output      03/29 0701 - 03/30 0700 03/30 0701 - 03/31 0700   I.V. 2042.5    Total Intake 2042.5     Urine 575    Blood 220    Total Output 795     Net +1247.5             LABORATORY DATA:  Recent Labs  12/18/14 0950 12/24/14 1202 12/25/14 0441  WBC 7.7  --  11.1*  HGB 11.1* 13.9 10.0*  HCT 33.4* 41.0 30.9*  PLT  269  --  216    Recent Labs  12/18/14 0950 12/24/14 1202 12/25/14 0441  NA 129* 132* 132*  K 4.5 4.6 4.4  CL 91*  --  96  CO2 29  --  32  BUN 18  --  15  CREATININE 0.85  --  0.94  GLUCOSE 78 109* 131*  CALCIUM 9.2  --  8.8   Lab Results  Component Value Date   INR 1.00 12/18/2014    Recent Radiographic Studies :  Dg Chest 2 View  12/18/2014   CLINICAL DATA:  Preop left hip replacement.  EXAM: CHEST  2 VIEW  COMPARISON:  05/25/2014  FINDINGS: Mild peribronchial thickening likely reflects chronic bronchitis. Heart is normal size. No confluent opacities or effusions. No acute bony abnormality.  IMPRESSION: Chronic bronchitic changes.  No active disease.   Electronically Signed   By: Rolm Baptise M.D.   On: 12/18/2014 09:51   Dg Hip Port Unilat With Pelvis 1v Left  12/24/2014   CLINICAL DATA:  Postoperative fascia postop left hip replacement.  EXAM: LEFT HIP (WITH PELVIS) 1 VIEW PORTABLE  COMPARISON:  None.  FINDINGS: Changes of left hip replacement. No hardware or bony complicating feature. Normal alignment. Advanced degenerative changes in the right hip. Postoperative changes partially imaged in the visualized lower lumbar spine.  IMPRESSION: Left hip replacement.  No complicating feature.   Electronically Signed   By: Rolm Baptise M.D.   On: 12/24/2014 16:51     Examination:  General appearance: alert, cooperative and no distress  Wound Exam: clean, dry, intact   Drainage:  None: wound tissue dry  Motor Exam: EHL, FHL, Anterior Tibial and Posterior Tibial Intact  Sensory Exam: Superficial Peroneal, Deep Peroneal and Tibial normal  Vascular Exam: Normal  Assessment:    1 Day Post-Op  Procedure(s) (LRB): TOTAL HIP ARTHROPLASTY (Left)  ADDITIONAL DIAGNOSIS:  Active Problems:   Primary osteoarthritis of left hip   S/P total hip arthroplasty  Acute Blood Loss Anemia and Hyponatremia-asymptomatic, monitor, Na low pre op and without change   Plan: Physical Therapy  as ordered Weight Bearing as Tolerated (WBAT)  DVT Prophylaxis:  Xarelto, Foot Pumps and TED hose  DISCHARGE PLAN: Skilled Nursing Facility/Rehab  DISCHARGE NEEDS: HHPT, Walker and 3-in-1 comode seat  Catheter out, awake, alert this am-KVO IV, OOB with PT      [name] W  12/25/2014 7:29 AM

## 2014-12-25 NOTE — Evaluation (Signed)
Physical Therapy Evaluation Patient Details Name: Carrie Gillespie MRN: [id number] DOB: [birth year] Today's Date: 12/25/2014   History of Present Illness  Pt is a 81 y/o F s/p L THA.  Pt's PMH includes 2005 and 2014 lumbar spine surgery, L TKA in 2005, PVD, GERD, and CAD.  Clinical Impression  Pt is s/p THA resulting in the deficits listed below (see PT Problem List). Pt required min assist during bed mobility for positioning of LLE.  Pt required min assist to stand upright and assistance w/ RW positioning during ambulation.  Pt will benefit from skilled PT to increase their independence and safety with mobility to allow discharge to the venue listed below.     Follow Up Recommendations Supervision/Assistance - 24 hour;SNF    Equipment Recommendations  Rolling walker with 5" wheels;3in1 (PT)    Recommendations for Other Services OT consult     Precautions / Restrictions Precautions Precautions: Posterior Hip;Fall Precaution Booklet Issued: Yes (comment) Precaution Comments: reviewed 3/3 precautions Required Braces or Orthoses: Knee Immobilizer - Left Knee Immobilizer - Left: Other (comment) (not specified in order) Restrictions Weight Bearing Restrictions: Yes (WBAT LLE)      Mobility  Bed Mobility Overal bed mobility: Needs Assistance Bed Mobility: Supine to Sit     Supine to sit: Min assist;HOB elevated     General bed mobility comments: Min assist w/ bringing LLE OOB.  Verbal cues for proper positioning of hands and sequencing of moving trunk and LEs to sit up  Transfers Overall transfer level: Needs assistance Equipment used: Rolling walker (2 wheeled) Transfers: Sit to/from Stand Sit to Stand: Min assist         General transfer comment: Verbal cues for proper hand placement, specifically to have hands on handrests during sit<>stand and to bring LLE out in front 2/2 KI when stand>sit.  Min assist for standing upright.  Ambulation/Gait Ambulation/Gait assistance:  Min assist Ambulation Distance (Feet): 8 Feet Assistive device: Rolling walker (2 wheeled) Gait Pattern/deviations: Step-to pattern;Decreased stance time - left;Decreased stride length;Antalgic;Trunk flexed Gait velocity: decr Gait velocity interpretation: Below normal speed for age/gender General Gait Details: Pt w/ slightly flexed posture.  Min assist for support to stand upright and for proper positioning of RW.    Stairs            Wheelchair Mobility    Modified Rankin (Stroke Patients Only)       Balance Overall balance assessment: Needs assistance Sitting-balance support: Bilateral upper extremity supported;Feet supported Sitting balance-Leahy Scale: Fair     Standing balance support: Bilateral upper extremity supported Standing balance-Leahy Scale: Fair                               Pertinent Vitals/Pain Pain Assessment: 0-10 Pain Score: 8  Pain Location: L hip Pain Descriptors / Indicators: Aching;Constant;Jabbing Pain Intervention(s): Limited activity within patient's tolerance;Monitored during session;Repositioned    Home Living Family/patient expects to be discharged to:: Skilled nursing facility [facility]) Living Arrangements: Alone                    Prior Function Level of Independence: Independent with assistive device(s) (RW at all times)         Comments: has RW, cane, rollator     Hand Dominance        Extremity/Trunk Assessment               Lower Extremity Assessment: LLE deficits/detail;Generalized  weakness (generalized weakness BLEs)   LLE Deficits / Details: as expected s/p L THA     Communication   Communication: HOH  Cognition Arousal/Alertness: Awake/alert Behavior During Therapy: WFL for tasks assessed/performed Overall Cognitive Status: Within Functional Limits for tasks assessed                      General Comments General comments (skin integrity, edema, etc.): Speak clearly  and inc volume as pt is HOH.      Exercises Total Joint Exercises Ankle Circles/Pumps: AROM;Both;10 reps;Supine Quad Sets: AROM;Both;10 reps;Supine Heel Slides: AAROM;Left;5 reps;Supine      Assessment/Plan    PT Assessment Patient needs continued PT services  PT Diagnosis Difficulty walking;Abnormality of gait;Acute pain;Generalized weakness   PT Problem List Decreased strength;Decreased range of motion;Decreased activity tolerance;Decreased balance;Decreased mobility;Decreased coordination;Decreased knowledge of use of DME;Decreased safety awareness;Pain;Decreased knowledge of precautions  PT Treatment Interventions DME instruction;Gait training;Stair training;Functional mobility training;Therapeutic activities;Therapeutic exercise;Balance training;Neuromuscular re-education;Patient/family education;Modalities   PT Goals (Current goals can be found in the Care Plan section) Acute Rehab PT Goals Patient Stated Goal: none stated PT Goal Formulation: With patient Time For Goal Achievement: 01/01/15 Potential to Achieve Goals: Good    Frequency 7X/week   Barriers to discharge Decreased caregiver support lives alone    Co-evaluation               End of Session Equipment Utilized During Treatment: Gait belt;Left knee immobilizer Activity Tolerance: Patient limited by fatigue Patient left: in chair;with call bell/phone within reach;with chair alarm set Nurse Communication: Mobility status;Precautions;Weight bearing status (to nurse tech)         Time: [id number] PT Time Calculation (min) (ACUTE ONLY): 47 min   Charges:   PT Evaluation $Initial PT Evaluation Tier I: 1 Procedure PT Treatments $Gait Training: 8-22 mins $Therapeutic Exercise: 8-22 mins   PT G CodesJoslyn Hy PT, [place] [id number] 12/25/2014, 5:12 PM

## 2014-12-25 NOTE — Progress Notes (Signed)
PHARMACY INTERVENTION  Called Dr. Rudene Anda office this morning to discuss drug interaction between carbamazepine and Xarelto. Carbamazepine is an inducer and can increase the metabolism of Xarelto making it less effective - use together not recommended.  Spoke with Biagio Borg, PA who will discuss with Dr. Durward Fortes and either use aspirin or enoxaparin for VTE prophylaxis.  [place], Pharm.D., BCPS Clinical Pharmacist Pager: [phone] 12/25/2014 1:58 PM

## 2014-12-25 NOTE — Progress Notes (Signed)
OT Cancellation Note  Patient Details Name: Carrie Gillespie MRN: [id number] DOB: 09/29/1919   Cancelled Treatment:    Reason Eval/Treat Not Completed: OT screened.  Pt is Medicare current D/C plan is SNF. No apparent immediate acute care OT needs, therefore will defer OT to SNF. If OT eval is needed please call Acute Rehab Dept. at [phone] or text page OT at [phone].     Benito Mccreedy OTR/L [id number] 12/25/2014, 5:43 PM

## 2014-12-25 NOTE — Op Note (Signed)
Carrie Gillespie, Carrie Gillespie NO.:  [id number]  MEDICAL RECORD NO.:  [id number]  LOCATION:  5N05C                        FACILITY:  [facility]  PHYSICIAN:  Vonna Kotyk. Ruthel Martine, M.D.DATE OF BIRTH:  1920/06/12  DATE OF PROCEDURE:  12/24/2014 DATE OF DISCHARGE:                              OPERATIVE REPORT   PREOPERATIVE DIAGNOSIS:  Primary end-stage osteoarthritis, left hip.  POSTOPERATIVE DIAGNOSIS:  Primary end-stage osteoarthritis, left hip.  PROCEDURE:  Left total hip replacement.  SURGEON:  Vonna Kotyk. Durward Fortes, MD.  ASSISTANTAaron Edelman D. Petrarca, PA-C who was present throughout the operative procedure to ensure its timely completion.  ANESTHESIA:  Spinal with IV sedation.  COMPLICATIONS:  None.  COMPONENTS:  DePuy AML 12-mm standard femoral component, a 32-mm outer diameter hip ball with a +1 neck length, a 50-mm outer diameter Gription 3 metallic acetabular component with a single 6.5 titanium screw apex hole eliminator, and a Marathon polyethylene liner +4 with a 10-degree posterior lip.  COMPLICATIONS:  None.  PROCEDURE:  Carrie Gillespie was met with the family in the holding area, identified the left hip as appropriate operative site and marked it accordingly.  The patient was then transported to room #3 and placed under spinal anesthesia without difficulty.  She was given IV sedation and then placed in the lateral decubitus position with the left side up and secured to the operating room table with the Innomed hip system.  The left hip was then prepped from the iliac crest to the ankle with chlorhexidine scrub and DuraPrep x2.  Sterile draping was performed. Time-out was called.  The patient did have a Foley inserted by a nursing staff.  The urine was clear.  A routine Southern incision was utilized and via sharp dissection, carried down to subcutaneous tissue.  Abundant adipose tissue was incised and any bleeding was controlled with the Bovie.   Self-retaining retractors were inserted.  The iliotibial band was identified and incised along length of the skin incision.  Self-retaining retractors were placed more deeply.  The short external rotators were identified.  They were incised with the Bovie.  Any tendinous structures were tagged with 0 Ethibond suture. Capsule was identified and incised along the femoral neck and head. Serosanguineous fluid exuded from the joint.  The head was then easily dislocated posteriorly and amputated at the head and neck junction about a fingerbreadth proximal to the lesser trochanter using the calcar guide and oscillating saw.  I cleared soft tissue from the piriformis fossa, made a starting hole and then inserted the canal finder.  I reamed sequentially to 11.5 mm to accept a 12-mm component.  Rasping was performed sequentially from a 10.5 to a 12 mm component.  Calcar reamer was used to obtain the appropriate calcar angle at a nice flush fit with no cracks identified.  Retractors were then placed about the acetabulum.  The labrum was sharply excised.  Reaming was then performed sequentially to 49 mm to accept a 50-mm component, I had nice bleeding bone circumferentially.  I trialed a 48 and then a 50-mm component, had nice complete seating of the 48 but not the 50 with a good rim fit.  I therefore  inserted a Gription 3 metallic acetabular component, impacted using the external guide.  I then inserted the trial polyethylene component followed by the 12-mm femoral stem, and then a +1 neck length, and a 32-mm hip ball. The entire construct was reduced.  Acetabulum was stable and had excellent stability, I could not feel any subluxation.  It may have been 4 or 5 mm longer but there was no shorter neck and it was perfectly stable.  The trial components removed.  The acetabulum was irrigated.  I inserted a single 6.5-mm titanium screw after appropriate drilling and measuring. I felt like it was  in bone completely.  The apex hole eliminator was then inserted followed by the Marathon liner.  The 12-mm AML porous-coated stem was then impacted and flushed on the calcar.  Morse taper neck was cleaned, and then I inserted the +1 neck with a 32-mm outer diameter hip ball.  The acetabulum was cleaned.  The head was then reduced and again through a full range of motion perfectly stable.  The wound was irrigated with saline solution.  I closed the capsule anatomically with #1 Ethibond suture, the short external rotators with the same material.  I did check the sciatic nerve throughout the procedure and felt it was well protected and out of harm's way.  We injected the capsule with 0.25% Marcaine with epinephrine.  The iliotibial band was closed with a running #1 Vicryl, subcu closed in several layers of Vicryl and then 3-0 Monocryl, skin was closed with skin clips.  Sterile bulky dressing was applied followed by a patient's support stocking.  The patient tolerated the procedure without complication, returned to the postanesthesia recovery room without complication.     Vonna Kotyk. Durward Fortes, M.D.     PWW/MEDQ  D:  12/24/2014  T:  12/25/2014  Job:  [id number]

## 2014-12-25 NOTE — Progress Notes (Signed)
Utilization review completed.  

## 2014-12-25 NOTE — Progress Notes (Signed)
INITIAL NUTRITION ASSESSMENT  DOCUMENTATION CODES Per approved criteria  -Obesity Unspecified   INTERVENTION: Continue Resource Breeze po TID, each supplement provides 250 kcal and 9 grams of protein.  Recommend obtaining new weight to fully assess weight trends.  Encourage adequate PO intake.  NUTRITION DIAGNOSIS: Increased nutrient needs related to s/p surgery as evidenced by estimated nutrition needs.   Goal: Pt to meet >/= 90% of their estimated nutrition needs   Monitor:  PO intake, weight trends, labs, I/O's  Reason for Assessment: MST  81 y.o. female  Admitting Dx: <principal problem not specified>  ASSESSMENT: Pt with significant pain and discomfort in the left hip for many years. 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  Procedure(3/29): TOTAL HIP ARTHROPLASTY (Left)  Pt reports her appetite is fine currently and PTA at home eating at least 2 meals a day along with snacks in between. Current meal completion is 50%. Pt reports she does not like some of the food served. Pt reports usual body weight of 165-170 lbs. Pt currently has [employer] ordered and has been consuming them. Will continue with current orders. Pt was encouraged to eat her food at meals and to drink her supplement.  Pt with no observed significant fat or muscle mass loss.  Labs and medications reviewed.  Height: Ht Readings from Last 1 Encounters:  11/28/14 5\' 2"  (1.575 m)    Weight: Wt Readings from Last 1 Encounters:  11/28/14 176 lb (79.833 kg)    Ideal Body Weight: 110 lbs  % Ideal Body Weight: 160%  Wt Readings from Last 10 Encounters:  11/28/14 176 lb (79.833 kg)  01/23/14 174 lb 12.8 oz (79.289 kg)  07/24/13 179 lb 3.7 oz (81.3 kg)  12/27/12 182 lb (82.555 kg)  07/04/12 184 lb (83.462 kg)  12/16/11 186 lb (84.369 kg)  06/18/11 177 lb (80.287 kg)  03/22/11 182 lb (82.555 kg)  09/10/10 180 lb  (81.647 kg)  09/04/10 181 lb (82.101 kg)    Usual Body Weight: 165 lbs  % Usual Body Weight: 107%  BMI:  Body Mass Index: 32.43 kg/(m^2) Class I obesity  Estimated Nutritional Needs: Kcal: 1750-1950 Protein: 80-95 grams Fluid: 1.7 - 1.9 L/day  Skin: Incision on left hip and back, non-pitting LLE edema  Diet Order: Diet Heart Room service appropriate?: Yes; Fluid consistency:: Thin  EDUCATION NEEDS: -No education needs identified at this time   Intake/Output Summary (Last 24 hours) at 12/25/14 1041 Last data filed at 12/25/14 0900  Gross per 24 hour  Intake 2282.5 ml  Output    795 ml  Net 1487.5 ml    Last BM: 3/29  Labs:   Recent Labs Lab 12/24/14 1202 12/25/14 0441  NA 132* 132*  K 4.6 4.4  CL  --  96  CO2  --  32  BUN  --  15  CREATININE  --  0.94  CALCIUM  --  8.8  GLUCOSE 109* 131*    CBG (last 3)  No results for input(s): GLUCAP in the last 72 hours.  Scheduled Meds: . carbamazepine  200 mg Oral Daily  . docusate sodium  100 mg Oral BID  . DULoxetine  60 mg Oral Daily  . feeding supplement (RESOURCE BREEZE)  1 Container Oral TID BM  . isosorbide mononitrate  120 mg Oral Daily  . ketorolac  7.5 mg Intravenous 4 times per day  . lisinopril  10  mg Oral Daily  . metoprolol succinate  50 mg Oral Daily  . pantoprazole  40 mg Oral Daily  . pravastatin  40 mg Oral Daily  . rivaroxaban  10 mg Oral Q breakfast    Continuous Infusions: . sodium chloride 75 mL/hr (12/24/14 1906)    Past Medical History  Diagnosis Date  . Other and unspecified hyperlipidemia     takes Pravastatin daily  . Coronary atherosclerosis of native coronary artery     Status post stent to right coronary 2004, catheterization 2005 nonobstructive CAD, normal LV function, Cardiolite study 2008 negative for ischemia. Residual moderate ostial RCA disease. Stress testing 2012 negative for ischemia ejection fraction 78%  . Nerve entrapment syndrome of lower extremity      status  post prior back surgery and weakness in left leg  . Arthritis   . Unspecified essential hypertension     takes Lisinopril,Metoprolol,and Imdur daily  . Depression     takes Cymbalta daily  . GERD (gastroesophageal reflux disease)     takes Omeprazole daily  . History of bronchitis     > 42yrs ago   . Seizures     takes Tegretol daily;has only had 1 seizure and that was several yrs ago.  . Peripheral neuropathy   . Joint pain   . Diverticulosis   . History of blood transfusion     no abnormal reaction noted  . Macular degeneration     dry  . Peripheral vascular disease, unspecified     Minimal internal carotid artery disease  . Carotid artery disease     with an occluded right vertebral artery, otherwise nonobstructive carotid artery disease    Past Surgical History  Procedure Laterality Date  . Kidney surgery  2010    1/4 of right kidney removed [facility]  . Cardiac catheterization      nonobstructive coronary artery disease  . Cataract extraction    . Knee arthroplasty Left 2004  . Partial hysterectomy    . Coronary angioplasty  1998-1999  . Colonoscopy    . Back surgery  10/14    Kallie Locks, MS, RD, LDN Pager # [phone] After hours/ weekend pager # [phone]

## 2014-12-25 NOTE — Care Management Note (Signed)
CARE MANAGEMENT NOTE 12/25/2014  Patient:  CHRISETTE, FILION   Account Number:  [id number]  Date Initiated:  12/25/2014  Documentation initiated by:  Ricki Miller  Subjective/Objective Assessment:   81 yr old female admitted with osteoarthritis of left hip.atient underwent a left total hip arthroplasty.     Action/Plan:   Patient will need shortterm rehab at [facility]. Social worker is aware.   Anticipated DC Date:     Anticipated DC Plan:  SKILLED NURSING FACILITY  In-house referral  Clinical Social Worker      DC Planning Services  CM consult      Choice offered to / List presented to:     DME arranged  NA        [place] arranged  NA      Status of service:  In process, will continue to follow Medicare Important Message given?   (If response is "NO", the following Medicare IM given date fields will be blank) Date Medicare IM given:   Medicare IM given by:   Date Additional Medicare IM given:   Additional Medicare IM given by:    Discharge Disposition:    Per UR Regulation:  Reviewed for med. necessity/level of care/duration of stay  If discussed at [place] of Stay Meetings, dates discussed:    Comments:

## 2014-12-26 LAB — BASIC METABOLIC PANEL
Anion gap: 7 (ref 5–15)
BUN: 17 mg/dL (ref 6–23)
CO2: 29 mmol/L (ref 19–32)
CREATININE: 0.92 mg/dL (ref 0.50–1.10)
Calcium: 8.7 mg/dL (ref 8.4–10.5)
Chloride: 94 mmol/L — ABNORMAL LOW (ref 96–112)
GFR calc Af Amer: 61 mL/min — ABNORMAL LOW (ref >90)
GFR calc non Af Amer: 52 mL/min — ABNORMAL LOW (ref >90)
GLUCOSE: 133 mg/dL — AB (ref 70–99)
Potassium: 4.2 mmol/L (ref 3.5–5.1)
Sodium: 130 mmol/L — ABNORMAL LOW (ref 135–145)

## 2014-12-26 LAB — CBC
HEMATOCRIT: 26.1 % — AB (ref 36.0–46.0)
Hemoglobin: 8.7 g/dL — ABNORMAL LOW (ref 12.0–15.0)
MCH: 30.6 pg (ref 26.0–34.0)
MCHC: 33.3 g/dL (ref 30.0–36.0)
MCV: 91.9 fL (ref 78.0–100.0)
PLATELETS: 199 10*3/uL (ref 150–400)
RBC: 2.84 MIL/uL — ABNORMAL LOW (ref 3.87–5.11)
RDW: 13.8 % (ref 11.5–15.5)
WBC: 10.1 10*3/uL (ref 4.0–10.5)

## 2014-12-26 MED ORDER — ASPIRIN 325 MG PO TBEC: 325.0000 mg | DELAYED_RELEASE_TABLET | Freq: Every day | ORAL | Status: DC

## 2014-12-26 MED ORDER — HYDROCODONE-ACETAMINOPHEN 7.5-325 MG PO TABS: 1.0000 | ORAL_TABLET | Freq: Four times a day (QID) | ORAL | Status: DC | PRN

## 2014-12-26 MED ORDER — BOOST / RESOURCE BREEZE PO LIQD: 1.0000 | Freq: Three times a day (TID) | ORAL | Status: DC

## 2014-12-26 NOTE — Clinical Social Work Placement (Signed)
Clinical Social Work Department CLINICAL SOCIAL WORK PLACEMENT NOTE 12/26/2014  Patient:  Carrie Gillespie, Carrie Gillespie  Account Number:  [id number] [place] date:  12/24/2014  Clinical Social Worker:  Durward Fortes, CLINICAL SOCIAL WORKER  Date/time:  12/26/2014 10:06 AM  Clinical Social Work is seeking post-discharge placement for this patient at the following level of care:   [place]   (*CSW will update this form in Epic as items are completed)   12/26/2014  Patient/family provided with [place] Department of Clinical Social Work's list of facilities offering this level of care within the geographic area requested by the patient (or if unable, by the patient's family).  12/26/2014  Patient/family informed of their freedom to choose among providers that offer the needed level of care, that participate in Medicare, Medicaid or managed care program needed by the patient, have an available bed and are willing to accept the patient.  12/26/2014  Patient/family informed of [facility]' ownership interest in [facility], as well as of the fact that they are under no obligation to receive care at this facility.  PASARR submitted to EDS on  PASARR number received on   FL2 transmitted to all facilities in geographic area requested by pt/family on   FL2 transmitted to all facilities within larger geographic area on   Patient informed that his/her managed care company has contracts with or will negotiate with  certain facilities, including the following:     Patient/family informed of bed offers received:  12/27/2014 Patient chooses bed at [facility] in [place] ( pre-arranged by family) Physician recommends and patient chooses bed at  n/a  Patient to be transferred to  [facility] in [place] on  12/27/2014 Patient to be transferred to facility by Family (daughter) Patient and family notified of transfer on 12/27/2014 Name of family member notified:  Carrie Gillespie at bedside  The following physician  request were entered in Epic:   Additional Comments:  Kierra S. Wiley, BSW-Intern

## 2014-12-26 NOTE — Discharge Summary (Signed)
Carrie Fears, MD   Carrie Borg, PA-C [address], [place], [place]  [address]                             [phone]  PATIENT ID: Carrie Gillespie        MRN:  [id number]          DOB/AGE: 1919-11-21 / 81 y.o.    DISCHARGE SUMMARY  ADMISSION DATE:    12/24/2014 DISCHARGE DATE:   12/27/2014   ADMISSION DIAGNOSIS: END STAGE PRIMARY OSTEOARTHRITIS OF LEFT HIP    DISCHARGE DIAGNOSIS:  END STAGE PRIMARY OSTEOARTHRITIS OF LEFT HIP    ADDITIONAL DIAGNOSIS: Active Problems:   Primary osteoarthritis of left hip   S/P total hip arthroplasty  Past Medical History  Diagnosis Date  . Other and unspecified hyperlipidemia     takes Pravastatin daily  . Coronary atherosclerosis of native coronary artery     Status post stent to right coronary 2004, catheterization 2005 nonobstructive CAD, normal LV function, Cardiolite study 2008 negative for ischemia. Residual moderate ostial RCA disease. Stress testing 2012 negative for ischemia ejection fraction 78%  . Nerve entrapment syndrome of lower extremity      status post prior back surgery and weakness in left leg  . Arthritis   . Unspecified essential hypertension     takes Lisinopril,Metoprolol,and Imdur daily  . Depression     takes Cymbalta daily  . GERD (gastroesophageal reflux disease)     takes Omeprazole daily  . History of bronchitis     > 85yrs ago   . Seizures     takes Tegretol daily;has only had 1 seizure and that was several yrs ago.  . Peripheral neuropathy   . Joint pain   . Diverticulosis   . History of blood transfusion     no abnormal reaction noted  . Macular degeneration     dry  . Peripheral vascular disease, unspecified     Minimal internal carotid artery disease  . Carotid artery disease     with an occluded right vertebral artery, otherwise nonobstructive carotid artery disease    PROCEDURE: Procedure(s): TOTAL HIP ARTHROPLASTY Left on 12/24/2014  CONSULTS: none     HISTORY: She has had  significant pain and discomfort in the left hip for many years. 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 She has been tried with several injections of cortisone with initial results that were good, however, it is now coming to the point where her injections have not been very good. They have not been helpful and she is now having to use a walker. She is so miserable that she feels that she cannot continue to do her activities of daily living, because of this. She had reached a point where at her last visit that she stated that she "is ready for the Lord to take her" if she is going to continue to have this much pain and discomfort. Her pain is extremely severe, constant, aching with catching. She states that she cannot go on living like this. She is seen today for evaluation.    HOSPITAL COURSE:  Carrie Gillespie is a 81 y.o. admitted on 12/24/2014 and found to have a diagnosis of END STAGE PRIMARY OSTEOARTHRITIS OF LEFT HIP.  After appropriate laboratory studies were obtained  they were taken  to the operating room on 12/24/2014 and underwent  Procedure(s): TOTAL HIP ARTHROPLASTY  Left.   They were given perioperative antibiotics:  Anti-infectives    Start     Dose/Rate Route Frequency Ordered Stop   12/24/14 1930  ceFAZolin (ANCEF) IVPB 1 g/50 mL premix     1 g 100 mL/hr over 30 Minutes Intravenous Every 6 hours 12/24/14 1731 12/25/14 0250   12/24/14 0600  ceFAZolin (ANCEF) IVPB 2 g/50 mL premix     2 g 100 mL/hr over 30 Minutes Intravenous On call to O.R. 12/23/14 1212 12/24/14 1357    .  Tolerated the procedure well.  Placed with a foley intraoperatively.    Toradol was given post op.  POD #1, allowed out of bed to a chair.  PT for ambulation and exercise program.  Foley D/C'd in morning.  IV saline locked.  O2 discontionued.  POD #2, continued PT and ambulation.  Hypo active bowel  sounds but active. Emesis x1 but felt better . POD #3, continued PT and ambulation.  Nausea improved and is hungry.  The remainder of the hospital course was dedicated to ambulation and strengthening.   The patient was discharged on 3 Days Post-Op in  Stable condition.  Blood products given:none  DIAGNOSTIC STUDIES: Recent vital signs:  Patient Vitals for the past 24 hrs:  BP Temp Temp src Pulse Resp SpO2  12/27/14 0429 (!) 152/63 mmHg 98.9 F (37.2 C) - 100 18 94 %  12/27/14 0322 - - - - 16 -  12/27/14 0000 - - - - 16 -  12/26/14 2138 (!) 155/56 mmHg - - - - -  12/26/14 2024 (!) 168/65 mmHg 98.2 F (36.8 C) - 94 18 95 %  12/26/14 2000 - - - - 16 -  12/26/14 1200 - - - - 16 96 %  12/26/14 1100 (!) 157/57 mmHg 98.9 F (37.2 C) - 94 16 96 %  12/26/14 0947 134/66 mmHg 98.1 F (36.7 C) Oral 92 16 98 %       Recent laboratory studies:  Recent Labs  12/24/14 1202 12/25/14 0441 12/26/14 0800 12/27/14 0547  WBC  --  11.1* 10.1 10.2  HGB 13.9 10.0* 8.7* 8.2*  HCT 41.0 30.9* 26.1* 24.5*  PLT  --  216 199 199    Recent Labs  12/24/14 1202 12/25/14 0441 12/26/14 0800 12/27/14 0547  NA 132* 132* 130* 127*  K 4.6 4.4 4.2 4.1  CL  --  96 94* 92*  CO2  --  32 29 31  BUN  --  15 17 18   CREATININE  --  0.94 0.92 0.71  GLUCOSE 109* 131* 133* 111*  CALCIUM  --  8.8 8.7 8.4   Lab Results  Component Value Date   INR 1.00 12/18/2014     Recent Radiographic Studies :  Dg Chest 2 View  12/18/2014   CLINICAL DATA:  Preop left hip replacement.  EXAM: CHEST  2 VIEW  COMPARISON:  05/25/2014  FINDINGS: Mild peribronchial thickening likely reflects chronic bronchitis. Heart is normal size. No confluent opacities or effusions. No acute bony abnormality.  IMPRESSION: Chronic bronchitic changes.  No active disease.   Electronically Signed   By: Rolm Baptise M.D.   On: 12/18/2014 09:51   Dg Hip Port Unilat With Pelvis 1v Left  12/24/2014   CLINICAL DATA:  Postoperative fascia postop  left hip replacement.  EXAM: LEFT HIP (WITH PELVIS) 1 VIEW PORTABLE  COMPARISON:  None.  FINDINGS: Changes  of left hip replacement. No hardware or bony complicating feature. Normal alignment. Advanced degenerative changes in the right hip. Postoperative changes partially imaged in the visualized lower lumbar spine.  IMPRESSION: Left hip replacement.  No complicating feature.   Electronically Signed   By: Rolm Baptise M.D.   On: 12/24/2014 16:51    DISCHARGE INSTRUCTIONS:     Discharge Instructions    Call MD / Call 911    Complete by:  As directed   If you experience chest pain or shortness of breath, CALL 911 and be transported to the hospital emergency room.  If you develope a fever above 101 F, pus (white drainage) or increased drainage or redness at the wound, or calf pain, call your surgeon's office.     Change dressing    Complete by:  As directed   DO NOT CHANGE THE DRESSING     Constipation Prevention    Complete by:  As directed   Drink plenty of fluids.  Prune juice may be helpful.  You may use a stool softener, such as Colace (over the counter) 100 mg twice a day.  Use MiraLax (over the counter) for constipation as needed.     Diet general    Complete by:  As directed      Driving restrictions    Complete by:  As directed   No driving for 6 weeks     Follow the hip precautions as taught in Physical Therapy    Complete by:  As directed      Increase activity slowly as tolerated    Complete by:  As directed      Lifting restrictions    Complete by:  As directed   No lifting for 6 weeks     Patient may shower    Complete by:  As directed   You may shower over the brown dressing     TED hose    Complete by:  As directed   Use stockings (TED hose) for 3 weeks on left leg.  You may remove them at night for sleeping.     Weight bearing as tolerated    Complete by:  As directed   Laterality:  left  Extremity:  Lower           DISCHARGE MEDICATIONS:     Medication List      STOP taking these medications        chlorthalidone 25 MG tablet  Commonly known as:  HYGROTON     ibuprofen 600 MG tablet  Commonly known as:  ADVIL,MOTRIN     oxyCODONE-acetaminophen 7.5-325 MG per tablet  Commonly known as:  PERCOCET      TAKE these medications        aspirin 325 MG EC tablet  Take 1 tablet (325 mg total) by mouth daily.     diphenhydrAMINE 25 MG tablet  Commonly known as:  BENADRYL  Take 25 mg by mouth every 6 (six) hours as needed for allergies.     DULoxetine 60 MG capsule  Commonly known as:  CYMBALTA  Take 60 mg by mouth daily.     feeding supplement (RESOURCE BREEZE) Liqd  Take 1 Container by mouth 3 (three) times daily between meals.     HYDROcodone-acetaminophen 7.5-325 MG per tablet  Commonly known as:  NORCO  Take 1 tablet by mouth every 6 (six) hours as needed for moderate pain.     isosorbide mononitrate 120 MG 24 hr tablet  Commonly known as:  IMDUR  Take 120 mg by mouth daily.     lisinopril 10 MG tablet  Commonly known as:  PRINIVIL,ZESTRIL  Take 10 mg by mouth daily.     metoprolol succinate 50 MG 24 hr tablet  Commonly known as:  TOPROL-XL  Take 50 mg by mouth daily. Take with or immediately following a meal.     nitroGLYCERIN 0.4 MG SL tablet  Commonly known as:  NITROSTAT  Place 0.4 mg under the tongue every 5 (five) minutes as needed for chest pain.     pravastatin 40 MG tablet  Commonly known as:  PRAVACHOL  Take 40 mg by mouth daily.     PRESERVISION AREDS PO  Take 1 tablet by mouth daily.     PRILOSEC 20 MG capsule  Generic drug:  omeprazole  Take 20 mg by mouth daily.     TEGRETOL-XR 200 MG 12 hr tablet  Generic drug:  carbamazepine  Take 200 mg by mouth daily.        FOLLOW UP VISIT:   Follow-up Information    Follow up with [facility], Vonna Kotyk, MD. Schedule an appointment as soon as possible for a visit on 01/08/2015.   Specialty:  Orthopedic Surgery   Contact information:   [address] [address] [phone]       DISPOSITION:   Skilled Nursing Facility/Rehab  CONDITION:  Stable   Mike Craze. [place], [place] [phone]  12/27/2014 9:04 AM

## 2014-12-26 NOTE — Clinical Social Work Psychosocial (Cosign Needed)
Clinical Social Work Department BRIEF PSYCHOSOCIAL ASSESSMENT 12/26/2014  Patient:  Carrie Gillespie, Carrie Gillespie     Account Number:  [id number]     Admit date:  12/24/2014  Clinical Social Worker:  Durward Fortes, CLINICAL SOCIAL WORKER  Date/Time:  12/26/2014 09:58 AM  Referred by:  Physician  Date Referred:  12/26/2014 Referred for  SNF Placement   Other Referral:   none.   Interview type:  Patient Other interview type:   none.    PSYCHOSOCIAL DATA Living Status:  ALONE Admitted from facility:   Level of care:   Primary support name:  Dalbert Batman Primary support relationship to patient:  CHILD, ADULT Degree of support available:   Adequate support.    CURRENT CONCERNS Current Concerns  Post-Acute Placement   Other Concerns:   none.    SOCIAL WORK ASSESSMENT / PLAN CSW and BSW-Intern consulted regarding SNf placement for pt once medically stable for discharge.    BSW-Intern met with pt at bedside after receiving report that pt is from [place]. BSW-Intern wanted pt to confirm that pt was from [place] and that pt would like to be placed within a [place] SNF. Pt informed BSW-Intern that pt is living alone, howeverr pt's son Carrie Gillespie) is the contact person for pt if pt needs anything.    Pt is looking foward to the return back home to be with friends and family.    Pt expressed no stressors at the moment as it relates to discharge. CSW and BSW-Intern to continue to assist with discharge planning needs.   Assessment/plan status:  Psychosocial Support/Ongoing Assessment of Needs Other assessment/ plan:   none.   Information/referral to community resources:   Pt to be discharged to [place] once medically stable for discharge.    PATIENT'S/FAMILY'S RESPONSE TO PLAN OF CARE: Pt agreeable and understandin gof CSW plan of care. Pt expressed no further questions or concerns at this time.       Virgie Dad Shamell Hittle, BSW-Intern

## 2014-12-26 NOTE — Clinical Social Work Note (Cosign Needed)
BSW-Intern updated pt and pt's family at bedside regarding the status of [facility] in [place].  BSW -Intern informed family that all clinicals had been faxed over to the admissions staff at [facility], and are now waiting for confirmation of the facility for pt and pt's family.   Carrie Gillespie, [place]

## 2014-12-26 NOTE — Progress Notes (Signed)
Physical Therapy Treatment Patient Details Name: Carrie Gillespie MRN: [id number] DOB: August 22, 1920 Today's Date: 12/26/2014    History of Present Illness Pt is a 81 y/o F s/p L THA.  Pt's PMH includes 2005 and 2014 lumbar spine surgery, L TKA in 2005, PVD, GERD, and CAD.    PT Comments    Pt ambulated 20 ft to bathroom this session w/ productive vomit while on commode, RN notified.  Session limited following this w/ nausea and generalized weakness.  Pt requires verbal cues to bring LLE out in front of her during stand>sit 2/2 KI and requires min assist for proper positioning of RW during ambulation.     Follow Up Recommendations  Supervision/Assistance - 24 hour;SNF     Equipment Recommendations  Rolling walker with 5" wheels;3in1 (PT)    Recommendations for Other Services       Precautions / Restrictions Precautions Precautions: Posterior Hip;Fall Precaution Comments: reviewed 3/3 precautions Required Braces or Orthoses: Knee Immobilizer - Left Knee Immobilizer - Left:  (not specified in order) Restrictions Weight Bearing Restrictions: Yes (WBAT LLE)    Mobility  Bed Mobility Overal bed mobility: Needs Assistance Bed Mobility: Sit to Supine       Sit to supine: Min assist   General bed mobility comments: Min assist w/ BLEs into bed  Transfers Overall transfer level: Needs assistance Equipment used: Rolling walker (2 wheeled) Transfers: Sit to/from Stand Sit to Stand: Min assist         General transfer comment: Verbal cues for proper hand placement, specifically to have hands on handrests during sit<>stand and to bring LLE out in front 2/2 KI when stand>sit.  Min assist for standing upright.  Ambulation/Gait Ambulation/Gait assistance: Min assist Ambulation Distance (Feet): 20 Feet Assistive device: Rolling walker (2 wheeled) Gait Pattern/deviations: Antalgic;Trunk flexed;Decreased stance time - left;Decreased stride length;Step-to pattern Gait velocity:  decr Gait velocity interpretation: Below normal speed for age/gender General Gait Details: Min assist for standing upright and proper positioning of RW   Stairs            Wheelchair Mobility    Modified Rankin (Stroke Patients Only)       Balance Overall balance assessment: Needs assistance Sitting-balance support: Bilateral upper extremity supported;Feet supported Sitting balance-Leahy Scale: Fair     Standing balance support: Bilateral upper extremity supported Standing balance-Leahy Scale: Fair                      Cognition Arousal/Alertness: Awake/alert Behavior During Therapy: WFL for tasks assessed/performed Overall Cognitive Status: Within Functional Limits for tasks assessed                      Exercises Total Joint Exercises Ankle Circles/Pumps: AROM;Both;10 reps;Seated Straight Leg Raises: Left;5 reps;Supine;AAROM    General Comments General comments (skin integrity, edema, etc.): Pt w/ productive vomit while on toilet. Pt has a h/o nausea and vomitting      Pertinent Vitals/Pain Pain Assessment: 0-10 Pain Score: 10-Worst pain ever Pain Location: L hip Pain Descriptors / Indicators: Aching;Grimacing;Guarding;Heaviness;Moaning Pain Intervention(s): Limited activity within patient's tolerance;Monitored during session;Repositioned    Home Living                      Prior Function            PT Goals (current goals can now be found in the care plan section) Acute Rehab PT Goals Patient Stated Goal: to get back to  bed Progress towards PT goals: Progressing toward goals    Frequency  7X/week    PT Plan Current plan remains appropriate    Co-evaluation             End of Session Equipment Utilized During Treatment: Gait belt;Left knee immobilizer Activity Tolerance: Patient limited by pain;Patient limited by fatigue (limited by nausea) Patient left: in bed;with call bell/phone within reach;with  family/visitor present;with SCD's reapplied     Time: [id number] PT Time Calculation (min) (ACUTE ONLY): 35 min  Charges:  $Gait Training: 8-22 mins $Therapeutic Exercise: 8-22 mins                    G CodesJoslyn Hy PT, [place] [phone] 12/26/2014, 5:00 PM

## 2014-12-26 NOTE — Progress Notes (Signed)
Patient ID: Carrie Gillespie, female   DOB: September 10, 1920, 81 y.o.   MRN: [id number] PATIENT ID: Carrie Gillespie        MRN:  [id number]          DOB/AGE: September 07, 1920 / 81 y.o.    Carrie Fears, MD   Biagio Borg, PA-C [address] [place], [place]  [address]                             [phone]   PROGRESS NOTE  Subjective:  negative for Chest Pain  negative for Shortness of Breath  negative for Nausea/Vomiting   negative for Calf Pain    Tolerating Diet: yes         Patient reports pain as mild.     Much more comfortable today, no nausea, voiding without difficulty  Objective: Vital signs in last 24 hours:   Patient Vitals for the past 24 hrs:  BP Temp Temp src Pulse Resp SpO2  12/26/14 1100 (!) 157/57 mmHg 98.9 F (37.2 C) - 94 16 96 %  12/26/14 0947 134/66 mmHg 98.1 F (36.7 C) Oral 92 16 98 %  12/26/14 0541 (!) 151/59 mmHg 98.3 F (36.8 C) Oral 93 16 98 %  12/26/14 0400 - - - - 16 98 %  12/25/14 2317 - - - - 18 99 %  12/25/14 2003 (!) 162/64 mmHg 99 F (37.2 C) - (!) 101 18 99 %  12/25/14 2000 - - - - 18 99 %      Intake/Output from previous day:   03/30 0701 - 03/31 0700 In: 720 [P.O.:720] Out: 450 [Urine:450]   Intake/Output this shift:   03/31 0701 - 03/31 1900 In: 360 [P.O.:360] Out: -    Intake/Output      03/30 0701 - 03/31 0700 03/31 0701 - 04/01 0700   P.O. 720 360   I.V.     Total Intake 720 360   Urine 450    Blood     Total Output 450     Net +270 +360        Urine Occurrence 1 x       LABORATORY DATA:  Recent Labs  12/24/14 1202 12/25/14 0441 12/26/14 0800  WBC  --  11.1* 10.1  HGB 13.9 10.0* 8.7*  HCT 41.0 30.9* 26.1*  PLT  --  216 199    Recent Labs  12/24/14 1202 12/25/14 0441 12/26/14 0800  NA 132* 132* 130*  K 4.6 4.4 4.2  CL  --  96 94*  CO2  --  32 29  BUN  --  15 17  CREATININE  --  0.94 0.92  GLUCOSE 109* 131* 133*  CALCIUM  --  8.8 8.7   Lab Results  Component Value Date   INR 1.00 12/18/2014     Recent Radiographic Studies :  Dg Chest 2 View  12/18/2014   CLINICAL DATA:  Preop left hip replacement.  EXAM: CHEST  2 VIEW  COMPARISON:  05/25/2014  FINDINGS: Mild peribronchial thickening likely reflects chronic bronchitis. Heart is normal size. No confluent opacities or effusions. No acute bony abnormality.  IMPRESSION: Chronic bronchitic changes.  No active disease.   Electronically Signed   By: Rolm Baptise M.D.   On: 12/18/2014 09:51   Dg Hip Port Unilat With Pelvis 1v Left  12/24/2014   CLINICAL DATA:  Postoperative fascia postop left hip replacement.  EXAM: LEFT HIP (WITH  PELVIS) 1 VIEW PORTABLE  COMPARISON:  None.  FINDINGS: Changes of left hip replacement. No hardware or bony complicating feature. Normal alignment. Advanced degenerative changes in the right hip. Postoperative changes partially imaged in the visualized lower lumbar spine.  IMPRESSION: Left hip replacement.  No complicating feature.   Electronically Signed   By: Rolm Baptise M.D.   On: 12/24/2014 16:51     Examination:  General appearance: alert, cooperative and no distress  Wound Exam: clean, dry, intact   Drainage:  None: wound tissue dry  Motor Exam: EHL, FHL, Anterior Tibial and Posterior Tibial Intact  Sensory Exam: Superficial Peroneal, Deep Peroneal and Tibial normal  Vascular Exam: Normal  Assessment:    2 Days Post-Op  Procedure(s) (LRB): TOTAL HIP ARTHROPLASTY (Left)  ADDITIONAL DIAGNOSIS:  Active Problems:   Primary osteoarthritis of left hip   S/P total hip arthroplasty  Acute Blood Loss Anemia and Hyponatremia-asymptomatic   Plan: Physical Therapy as ordered Weight Bearing as Tolerated (WBAT)  DVT Prophylaxis:  Xarelto and TED hose  DISCHARGE PLAN: Skilled Nursing Facility/Rehab  DISCHARGE NEEDS: HHPT, Walker and 3-in-1 comode seat Dressing changed today-wound clean, dry, without calf pain-will plan on D/C tomorrow       Carrie Gillespie W  12/26/2014 12:52 PM

## 2014-12-27 DIAGNOSIS — D62 Acute posthemorrhagic anemia: Secondary | ICD-10-CM | POA: Diagnosis not present

## 2014-12-27 DIAGNOSIS — R569 Unspecified convulsions: Secondary | ICD-10-CM | POA: Diagnosis not present

## 2014-12-27 DIAGNOSIS — G629 Polyneuropathy, unspecified: Secondary | ICD-10-CM | POA: Diagnosis not present

## 2014-12-27 DIAGNOSIS — K5900 Constipation, unspecified: Secondary | ICD-10-CM | POA: Diagnosis not present

## 2014-12-27 DIAGNOSIS — M6281 Muscle weakness (generalized): Secondary | ICD-10-CM | POA: Diagnosis not present

## 2014-12-27 DIAGNOSIS — M25552 Pain in left hip: Secondary | ICD-10-CM | POA: Diagnosis not present

## 2014-12-27 DIAGNOSIS — E871 Hypo-osmolality and hyponatremia: Secondary | ICD-10-CM | POA: Diagnosis not present

## 2014-12-27 DIAGNOSIS — E785 Hyperlipidemia, unspecified: Secondary | ICD-10-CM | POA: Diagnosis not present

## 2014-12-27 DIAGNOSIS — Z471 Aftercare following joint replacement surgery: Secondary | ICD-10-CM | POA: Diagnosis not present

## 2014-12-27 DIAGNOSIS — Z96642 Presence of left artificial hip joint: Secondary | ICD-10-CM | POA: Diagnosis not present

## 2014-12-27 DIAGNOSIS — R52 Pain, unspecified: Secondary | ICD-10-CM | POA: Diagnosis not present

## 2014-12-27 DIAGNOSIS — I10 Essential (primary) hypertension: Secondary | ICD-10-CM | POA: Diagnosis not present

## 2014-12-27 DIAGNOSIS — K219 Gastro-esophageal reflux disease without esophagitis: Secondary | ICD-10-CM | POA: Diagnosis not present

## 2014-12-27 DIAGNOSIS — I2510 Atherosclerotic heart disease of native coronary artery without angina pectoris: Secondary | ICD-10-CM | POA: Diagnosis not present

## 2014-12-27 DIAGNOSIS — M4806 Spinal stenosis, lumbar region: Secondary | ICD-10-CM | POA: Diagnosis not present

## 2014-12-27 DIAGNOSIS — R531 Weakness: Secondary | ICD-10-CM | POA: Diagnosis not present

## 2014-12-27 DIAGNOSIS — H409 Unspecified glaucoma: Secondary | ICD-10-CM | POA: Diagnosis not present

## 2014-12-27 DIAGNOSIS — K5790 Diverticulosis of intestine, part unspecified, without perforation or abscess without bleeding: Secondary | ICD-10-CM | POA: Diagnosis not present

## 2014-12-27 DIAGNOSIS — M1612 Unilateral primary osteoarthritis, left hip: Secondary | ICD-10-CM | POA: Diagnosis not present

## 2014-12-27 DIAGNOSIS — R262 Difficulty in walking, not elsewhere classified: Secondary | ICD-10-CM | POA: Diagnosis not present

## 2014-12-27 DIAGNOSIS — I739 Peripheral vascular disease, unspecified: Secondary | ICD-10-CM | POA: Diagnosis not present

## 2014-12-27 DIAGNOSIS — F329 Major depressive disorder, single episode, unspecified: Secondary | ICD-10-CM | POA: Diagnosis not present

## 2014-12-27 LAB — BASIC METABOLIC PANEL
ANION GAP: 4 — AB (ref 5–15)
BUN: 18 mg/dL (ref 6–23)
CO2: 31 mmol/L (ref 19–32)
Calcium: 8.4 mg/dL (ref 8.4–10.5)
Chloride: 92 mmol/L — ABNORMAL LOW (ref 96–112)
Creatinine, Ser: 0.71 mg/dL (ref 0.50–1.10)
GFR calc Af Amer: 84 mL/min — ABNORMAL LOW (ref >90)
GFR calc non Af Amer: 72 mL/min — ABNORMAL LOW (ref >90)
Glucose, Bld: 111 mg/dL — ABNORMAL HIGH (ref 70–99)
POTASSIUM: 4.1 mmol/L (ref 3.5–5.1)
Sodium: 127 mmol/L — ABNORMAL LOW (ref 135–145)

## 2014-12-27 LAB — CBC
HCT: 24.5 % — ABNORMAL LOW (ref 36.0–46.0)
Hemoglobin: 8.2 g/dL — ABNORMAL LOW (ref 12.0–15.0)
MCH: 30.1 pg (ref 26.0–34.0)
MCHC: 33.5 g/dL (ref 30.0–36.0)
MCV: 90.1 fL (ref 78.0–100.0)
Platelets: 199 10*3/uL (ref 150–400)
RBC: 2.72 MIL/uL — ABNORMAL LOW (ref 3.87–5.11)
RDW: 13.8 % (ref 11.5–15.5)
WBC: 10.2 10*3/uL (ref 4.0–10.5)

## 2014-12-27 NOTE — Clinical Social Work Note (Signed)
CSW contacted daughter Horris Latino to review discharge plans for today.  CSW awaiting a return call.  Nonnie Done, [place] [phone]  Psychiatric & Orthopedics (5N 1-8) Clinical Social Worker

## 2014-12-27 NOTE — Progress Notes (Signed)
Physical Therapy Treatment Patient Details Name: Carrie Gillespie MRN: [id number] DOB: 05/03/1920 Today's Date: 12/27/2014    History of Present Illness Pt is a 81 y/o F s/p L THA.  Pt's PMH includes 2005 and 2014 lumbar spine surgery, L TKA in 2005, PVD, GERD, and CAD.    PT Comments    Pt required max verbal cues this session to maintain RW close to body and to keep BUEs on RW to maintain balance while standing and ambulating.  Pt w/ shuffle during ambulation which improved following verbal cues.  Pt continues to progress well w/ PT and is anticipating d/c to SNF today.   Follow Up Recommendations  Supervision/Assistance - 24 hour;SNF     Equipment Recommendations  Rolling walker with 5" wheels;3in1 (PT)    Recommendations for Other Services       Precautions / Restrictions Precautions Precautions: Posterior Hip;Fall Precaution Comments: reviewed 3/3 precautions Required Braces or Orthoses: Knee Immobilizer - Left Knee Immobilizer - Left:  (not specified in order) Restrictions Weight Bearing Restrictions: Yes (WBAT LLE)    Mobility  Bed Mobility Overal bed mobility:  (Pt on toilet w/ RN tech upon PT arrival)                Transfers Overall transfer level: Needs assistance Equipment used: Rolling walker (2 wheeled) Transfers: Sit to/from Stand Sit to Stand: Min guard         General transfer comment: verbal cues for proper hand placement and to keep hands on RW while standing 2/2 impaired balance  Ambulation/Gait Ambulation/Gait assistance: Min guard Ambulation Distance (Feet): 30 Feet Assistive device: Rolling walker (2 wheeled) Gait Pattern/deviations: Step-to pattern;Decreased stride length;Decreased stance time - left;Shuffle;Antalgic;Trunk flexed Gait velocity: decr Gait velocity interpretation: Below normal speed for age/gender General Gait Details: Verbal cues for pt not to shuffle feet and to pick them up to clear the floor.  Max verbal cues to  maintain RW close to body for safe ambulation.   Stairs            Wheelchair Mobility    Modified Rankin (Stroke Patients Only)       Balance Overall balance assessment: Needs assistance Sitting-balance support: No upper extremity supported;Feet supported Sitting balance-Leahy Scale: Fair     Standing balance support: No upper extremity supported;Bilateral upper extremity supported;During functional activity Standing balance-Leahy Scale: Fair Standing balance comment: slightl loss of balance when standing w/o BUEs on RW but pt able to stabilize following verbal cues to always have BUEs on RW when standing or ambulating                    Cognition Arousal/Alertness: Awake/alert Behavior During Therapy: WFL for tasks assessed/performed Overall Cognitive Status: Within Functional Limits for tasks assessed       Memory: Decreased recall of precautions (pt recalled 1/3 precautions)              Exercises Total Joint Exercises Ankle Circles/Pumps: AROM;Both;10 reps;Seated Hip ABduction/ADduction: AROM;Left;5 reps;Seated Straight Leg Raises: AROM;5 reps;Left;Seated    General Comments        Pertinent Vitals/Pain Pain Assessment: 0-10 Pain Score: 3  Pain Location: L hip Pain Descriptors / Indicators: Aching;Discomfort;Grimacing;Heaviness Pain Intervention(s): Limited activity within patient's tolerance;Monitored during session;Repositioned    Home Living                      Prior Function            PT Goals (  current goals can now be found in the care plan section) Acute Rehab PT Goals Patient Stated Goal: to get back to chair Progress towards PT goals: Progressing toward goals    Frequency  7X/week    PT Plan Current plan remains appropriate    Co-evaluation             End of Session Equipment Utilized During Treatment: Gait belt;Left knee immobilizer Activity Tolerance: Patient tolerated treatment well Patient left:  in chair;with call bell/phone within reach     Time: 0900-0924 PT Time Calculation (min) (ACUTE ONLY): 24 min  Charges:  $Gait Training: 8-22 mins $Therapeutic Exercise: 8-22 mins                    G CodesJoslyn Hy PT, [place] [id number] 12/27/2014, 10:13 AM

## 2014-12-27 NOTE — Progress Notes (Signed)
Attempted to call report to [facility] at [phone]

## 2014-12-27 NOTE — Discharge Planning (Signed)
Patient will discharge today per MD order. Patient will discharge to [facility] in [place] RN to call report prior to transportation to [phone] Transportation: Daughter  CSW sent discharge summary to SNF for review.  Packet is complete.  RN, patient and family aware of discharge plans.  Nonnie Done, [place] [phone]  Psychiatric & Orthopedics (5N 1-16) Clinical Social Worker

## 2014-12-27 NOTE — Progress Notes (Signed)
Patient ID: Carrie Gillespie, female   DOB: [birth year], 81 y.o.   MRN: [id number] PATIENT ID: Carrie Gillespie        MRN:  [id number]          DOB/AGE: [birth year] / 81 y.o.    [name], MD   Biagio Borg, PA-C [address] [place], [place]  [address]                             [phone]   PROGRESS NOTE  Subjective:  negative for Chest Pain  negative for Shortness of Breath  positive for Nausea/Vomiting x1 yesterday am- feeling better this am and even "ordered pancakes for breakfast"  negative for Calf Pain    Tolerating Diet: yes         Patient reports pain as mild.     No pain at rest  Objective: Vital signs in last 24 hours:   Patient Vitals for the past 24 hrs:  BP Temp Temp src Pulse Resp SpO2  12/27/14 0429 (!) 152/63 mmHg 98.9 F (37.2 C) - 100 18 94 %  12/27/14 0322 - - - - 16 -  12/27/14 0000 - - - - 16 -  12/26/14 2138 (!) 155/56 mmHg - - - - -  12/26/14 2024 (!) 168/65 mmHg 98.2 F (36.8 C) - 94 18 95 %  12/26/14 2000 - - - - 16 -  12/26/14 1200 - - - - 16 96 %  12/26/14 1100 (!) 157/57 mmHg 98.9 F (37.2 C) - 94 16 96 %  12/26/14 0947 134/66 mmHg 98.1 F (36.7 C) Oral 92 16 98 %      Intake/Output from previous day:   03/31 0701 - 04/01 0700 In: 360 [P.O.:360] Out: -    Intake/Output this shift:       Intake/Output      03/31 0701 - 04/01 0700 04/01 0701 - 04/02 0700   P.O. 360    Total Intake 360     Urine     Total Output       Net +360          Urine Occurrence 3 x       LABORATORY DATA:  Recent Labs  12/24/14 1202 12/25/14 0441 12/26/14 0800 12/27/14 0547  WBC  --  11.1* 10.1 10.2  HGB 13.9 10.0* 8.7* 8.2*  HCT 41.0 30.9* 26.1* 24.5*  PLT  --  216 199 199    Recent Labs  12/24/14 1202 12/25/14 0441 12/26/14 0800  NA 132* 132* 130*  K 4.6 4.4 4.2  CL  --  96 94*  CO2  --  32 29  BUN  --  15 17  CREATININE  --  0.94 0.92  GLUCOSE 109* 131* 133*  CALCIUM  --  8.8 8.7   Lab Results  Component Value Date   INR 1.00 12/18/2014    Recent Radiographic Studies :  Dg Chest 2 View  12/18/2014   CLINICAL DATA:  Preop left hip replacement.  EXAM: CHEST  2 VIEW  COMPARISON:  05/25/2014  FINDINGS: Mild peribronchial thickening likely reflects chronic bronchitis. Heart is normal size. No confluent opacities or effusions. No acute bony abnormality.  IMPRESSION: Chronic bronchitic changes.  No active disease.   Electronically Signed   By: Rolm Baptise M.D.   On: 12/18/2014 09:51   Dg Hip Port Unilat With Pelvis 1v Left  12/24/2014   CLINICAL  DATA:  Postoperative fascia postop left hip replacement.  EXAM: LEFT HIP (WITH PELVIS) 1 VIEW PORTABLE  COMPARISON:  None.  FINDINGS: Changes of left hip replacement. No hardware or bony complicating feature. Normal alignment. Advanced degenerative changes in the right hip. Postoperative changes partially imaged in the visualized lower lumbar spine.  IMPRESSION: Left hip replacement.  No complicating feature.   Electronically Signed   By: Rolm Baptise M.D.   On: 12/24/2014 16:51     Examination:  General appearance: alert, cooperative and no distress  Wound Exam: clean, dry, intact   Drainage:  None: wound tissue dry  Motor Exam: EHL, FHL, Anterior Tibial and Posterior Tibial Intact  Sensory Exam: Superficial Peroneal, Deep Peroneal and Tibial normal  Vascular Exam: Normal  Assessment:    3 Days Post-Op  Procedure(s) (LRB): TOTAL HIP ARTHROPLASTY (Left)  ADDITIONAL DIAGNOSIS:  Active Problems:   Primary osteoarthritis of left hip   S/P total hip arthroplasty  Acute Blood Loss Anemia and Hyponatremia-asymptomatic   Plan: Physical Therapy as ordered Weight Bearing as Tolerated (WBAT)  DVT Prophylaxis:  Aspirin and TED hose  DISCHARGE PLAN: Skilled Nursing Facility[facility] in [place]: HHPT, Walker and 3-in-1 comode seat   voiding without difficulty, no BM but passing "lots of gas"- abdomen soft with bowel sounds- will plan  to D/C today     [name] W  12/27/2014 7:21 AM

## 2014-12-27 NOTE — Progress Notes (Signed)
Report called to Butch Penny at [facility] in [place].

## 2014-12-31 DIAGNOSIS — M25552 Pain in left hip: Secondary | ICD-10-CM | POA: Diagnosis not present

## 2014-12-31 DIAGNOSIS — K219 Gastro-esophageal reflux disease without esophagitis: Secondary | ICD-10-CM | POA: Diagnosis not present

## 2014-12-31 DIAGNOSIS — F329 Major depressive disorder, single episode, unspecified: Secondary | ICD-10-CM | POA: Diagnosis not present

## 2014-12-31 DIAGNOSIS — I10 Essential (primary) hypertension: Secondary | ICD-10-CM | POA: Diagnosis not present

## 2015-01-01 ENCOUNTER — Ambulatory Visit: Payer: Medicare Other | Admitting: Cardiology

## 2015-01-01 DIAGNOSIS — I10 Essential (primary) hypertension: Secondary | ICD-10-CM | POA: Diagnosis not present

## 2015-01-01 DIAGNOSIS — Z96642 Presence of left artificial hip joint: Secondary | ICD-10-CM | POA: Diagnosis not present

## 2015-01-01 DIAGNOSIS — F329 Major depressive disorder, single episode, unspecified: Secondary | ICD-10-CM | POA: Diagnosis not present

## 2015-01-01 DIAGNOSIS — R531 Weakness: Secondary | ICD-10-CM | POA: Diagnosis not present

## 2015-01-03 DIAGNOSIS — R52 Pain, unspecified: Secondary | ICD-10-CM | POA: Diagnosis not present

## 2015-01-03 DIAGNOSIS — M4806 Spinal stenosis, lumbar region: Secondary | ICD-10-CM | POA: Diagnosis not present

## 2015-01-08 DIAGNOSIS — M1612 Unilateral primary osteoarthritis, left hip: Secondary | ICD-10-CM | POA: Diagnosis not present

## 2015-01-10 DIAGNOSIS — R52 Pain, unspecified: Secondary | ICD-10-CM | POA: Diagnosis not present

## 2015-01-22 DIAGNOSIS — M1711 Unilateral primary osteoarthritis, right knee: Secondary | ICD-10-CM | POA: Diagnosis not present

## 2015-01-26 DIAGNOSIS — R42 Dizziness and giddiness: Secondary | ICD-10-CM | POA: Diagnosis not present

## 2015-01-26 DIAGNOSIS — R51 Headache: Secondary | ICD-10-CM | POA: Diagnosis not present

## 2015-01-26 DIAGNOSIS — R0781 Pleurodynia: Secondary | ICD-10-CM | POA: Diagnosis not present

## 2015-01-26 DIAGNOSIS — I10 Essential (primary) hypertension: Secondary | ICD-10-CM | POA: Diagnosis not present

## 2015-01-26 DIAGNOSIS — S299XXA Unspecified injury of thorax, initial encounter: Secondary | ICD-10-CM | POA: Diagnosis not present

## 2015-01-26 DIAGNOSIS — I519 Heart disease, unspecified: Secondary | ICD-10-CM | POA: Diagnosis not present

## 2015-01-26 DIAGNOSIS — S20211A Contusion of right front wall of thorax, initial encounter: Secondary | ICD-10-CM | POA: Diagnosis not present

## 2015-01-26 DIAGNOSIS — Z8249 Family history of ischemic heart disease and other diseases of the circulatory system: Secondary | ICD-10-CM | POA: Diagnosis not present

## 2015-01-26 DIAGNOSIS — S0990XA Unspecified injury of head, initial encounter: Secondary | ICD-10-CM | POA: Diagnosis not present

## 2015-01-26 DIAGNOSIS — Z955 Presence of coronary angioplasty implant and graft: Secondary | ICD-10-CM | POA: Diagnosis not present

## 2015-01-26 DIAGNOSIS — W19XXXA Unspecified fall, initial encounter: Secondary | ICD-10-CM | POA: Diagnosis not present

## 2015-01-31 DIAGNOSIS — E871 Hypo-osmolality and hyponatremia: Secondary | ICD-10-CM | POA: Diagnosis not present

## 2015-01-31 DIAGNOSIS — E782 Mixed hyperlipidemia: Secondary | ICD-10-CM | POA: Diagnosis not present

## 2015-01-31 DIAGNOSIS — I10 Essential (primary) hypertension: Secondary | ICD-10-CM | POA: Diagnosis not present

## 2015-01-31 DIAGNOSIS — N189 Chronic kidney disease, unspecified: Secondary | ICD-10-CM | POA: Diagnosis not present

## 2015-02-05 DIAGNOSIS — M1612 Unilateral primary osteoarthritis, left hip: Secondary | ICD-10-CM | POA: Diagnosis not present

## 2015-02-05 DIAGNOSIS — Z96642 Presence of left artificial hip joint: Secondary | ICD-10-CM | POA: Diagnosis not present

## 2015-02-05 DIAGNOSIS — Z1389 Encounter for screening for other disorder: Secondary | ICD-10-CM | POA: Diagnosis not present

## 2015-02-05 DIAGNOSIS — M4800 Spinal stenosis, site unspecified: Secondary | ICD-10-CM | POA: Diagnosis not present

## 2015-02-05 DIAGNOSIS — G2581 Restless legs syndrome: Secondary | ICD-10-CM | POA: Diagnosis not present

## 2015-02-05 DIAGNOSIS — K219 Gastro-esophageal reflux disease without esophagitis: Secondary | ICD-10-CM | POA: Diagnosis not present

## 2015-02-05 DIAGNOSIS — E871 Hypo-osmolality and hyponatremia: Secondary | ICD-10-CM | POA: Diagnosis not present

## 2015-02-05 DIAGNOSIS — E782 Mixed hyperlipidemia: Secondary | ICD-10-CM | POA: Diagnosis not present

## 2015-02-05 DIAGNOSIS — F328 Other depressive episodes: Secondary | ICD-10-CM | POA: Diagnosis not present

## 2015-02-05 DIAGNOSIS — I10 Essential (primary) hypertension: Secondary | ICD-10-CM | POA: Diagnosis not present

## 2015-02-05 DIAGNOSIS — N182 Chronic kidney disease, stage 2 (mild): Secondary | ICD-10-CM | POA: Diagnosis not present

## 2015-02-05 DIAGNOSIS — F4320 Adjustment disorder, unspecified: Secondary | ICD-10-CM | POA: Diagnosis not present

## 2015-02-12 DIAGNOSIS — E871 Hypo-osmolality and hyponatremia: Secondary | ICD-10-CM | POA: Diagnosis not present

## 2015-02-12 DIAGNOSIS — M1612 Unilateral primary osteoarthritis, left hip: Secondary | ICD-10-CM | POA: Diagnosis not present

## 2015-03-11 DIAGNOSIS — Z9861 Coronary angioplasty status: Secondary | ICD-10-CM | POA: Diagnosis not present

## 2015-03-12 DIAGNOSIS — E871 Hypo-osmolality and hyponatremia: Secondary | ICD-10-CM | POA: Diagnosis not present

## 2015-03-12 DIAGNOSIS — I10 Essential (primary) hypertension: Secondary | ICD-10-CM | POA: Diagnosis not present

## 2015-03-12 DIAGNOSIS — N182 Chronic kidney disease, stage 2 (mild): Secondary | ICD-10-CM | POA: Diagnosis not present

## 2015-03-13 DIAGNOSIS — I10 Essential (primary) hypertension: Secondary | ICD-10-CM | POA: Diagnosis not present

## 2015-03-19 DIAGNOSIS — M1711 Unilateral primary osteoarthritis, right knee: Secondary | ICD-10-CM | POA: Diagnosis not present

## 2015-03-20 DIAGNOSIS — F4320 Adjustment disorder, unspecified: Secondary | ICD-10-CM | POA: Diagnosis not present

## 2015-03-20 DIAGNOSIS — F328 Other depressive episodes: Secondary | ICD-10-CM | POA: Diagnosis not present

## 2015-03-20 DIAGNOSIS — M1612 Unilateral primary osteoarthritis, left hip: Secondary | ICD-10-CM | POA: Diagnosis not present

## 2015-03-20 DIAGNOSIS — E871 Hypo-osmolality and hyponatremia: Secondary | ICD-10-CM | POA: Diagnosis not present

## 2015-03-20 DIAGNOSIS — G40802 Other epilepsy, not intractable, without status epilepticus: Secondary | ICD-10-CM | POA: Diagnosis not present

## 2015-03-20 DIAGNOSIS — I10 Essential (primary) hypertension: Secondary | ICD-10-CM | POA: Diagnosis not present

## 2015-03-20 DIAGNOSIS — Z96641 Presence of right artificial hip joint: Secondary | ICD-10-CM | POA: Diagnosis not present

## 2015-03-20 DIAGNOSIS — E782 Mixed hyperlipidemia: Secondary | ICD-10-CM | POA: Diagnosis not present

## 2015-03-20 DIAGNOSIS — G2581 Restless legs syndrome: Secondary | ICD-10-CM | POA: Diagnosis not present

## 2015-03-20 DIAGNOSIS — M4800 Spinal stenosis, site unspecified: Secondary | ICD-10-CM | POA: Diagnosis not present

## 2015-03-20 DIAGNOSIS — K219 Gastro-esophageal reflux disease without esophagitis: Secondary | ICD-10-CM | POA: Diagnosis not present

## 2015-03-20 DIAGNOSIS — N182 Chronic kidney disease, stage 2 (mild): Secondary | ICD-10-CM | POA: Diagnosis not present

## 2015-03-24 DIAGNOSIS — H60339 Swimmer's ear, unspecified ear: Secondary | ICD-10-CM | POA: Diagnosis not present

## 2015-03-24 DIAGNOSIS — I10 Essential (primary) hypertension: Secondary | ICD-10-CM | POA: Diagnosis not present

## 2015-03-24 DIAGNOSIS — H9201 Otalgia, right ear: Secondary | ICD-10-CM | POA: Diagnosis not present

## 2015-03-25 DIAGNOSIS — H9209 Otalgia, unspecified ear: Secondary | ICD-10-CM | POA: Diagnosis not present

## 2015-03-25 DIAGNOSIS — I2510 Atherosclerotic heart disease of native coronary artery without angina pectoris: Secondary | ICD-10-CM | POA: Diagnosis not present

## 2015-03-25 DIAGNOSIS — Z955 Presence of coronary angioplasty implant and graft: Secondary | ICD-10-CM | POA: Diagnosis not present

## 2015-03-25 DIAGNOSIS — Z794 Long term (current) use of insulin: Secondary | ICD-10-CM | POA: Diagnosis not present

## 2015-03-25 DIAGNOSIS — R51 Headache: Secondary | ICD-10-CM | POA: Diagnosis not present

## 2015-03-25 DIAGNOSIS — I10 Essential (primary) hypertension: Secondary | ICD-10-CM | POA: Diagnosis not present

## 2015-03-25 DIAGNOSIS — Z8249 Family history of ischemic heart disease and other diseases of the circulatory system: Secondary | ICD-10-CM | POA: Diagnosis not present

## 2015-03-25 DIAGNOSIS — Z90710 Acquired absence of both cervix and uterus: Secondary | ICD-10-CM | POA: Diagnosis not present

## 2015-03-25 DIAGNOSIS — E780 Pure hypercholesterolemia: Secondary | ICD-10-CM | POA: Diagnosis not present

## 2015-03-25 DIAGNOSIS — M5481 Occipital neuralgia: Secondary | ICD-10-CM | POA: Diagnosis not present

## 2015-03-25 DIAGNOSIS — Z79899 Other long term (current) drug therapy: Secondary | ICD-10-CM | POA: Diagnosis not present

## 2015-03-26 DIAGNOSIS — Z96642 Presence of left artificial hip joint: Secondary | ICD-10-CM | POA: Diagnosis not present

## 2015-03-26 DIAGNOSIS — M4800 Spinal stenosis, site unspecified: Secondary | ICD-10-CM | POA: Diagnosis not present

## 2015-03-26 DIAGNOSIS — M25559 Pain in unspecified hip: Secondary | ICD-10-CM | POA: Diagnosis not present

## 2015-03-26 DIAGNOSIS — M1990 Unspecified osteoarthritis, unspecified site: Secondary | ICD-10-CM | POA: Diagnosis not present

## 2015-03-26 DIAGNOSIS — R2681 Unsteadiness on feet: Secondary | ICD-10-CM | POA: Diagnosis not present

## 2015-03-26 DIAGNOSIS — M6281 Muscle weakness (generalized): Secondary | ICD-10-CM | POA: Diagnosis not present

## 2015-03-26 DIAGNOSIS — M25569 Pain in unspecified knee: Secondary | ICD-10-CM | POA: Diagnosis not present

## 2015-03-27 DIAGNOSIS — R42 Dizziness and giddiness: Secondary | ICD-10-CM | POA: Diagnosis not present

## 2015-03-27 DIAGNOSIS — I708 Atherosclerosis of other arteries: Secondary | ICD-10-CM | POA: Diagnosis not present

## 2015-03-27 DIAGNOSIS — R931 Abnormal findings on diagnostic imaging of heart and coronary circulation: Secondary | ICD-10-CM | POA: Diagnosis not present

## 2015-03-27 DIAGNOSIS — I6523 Occlusion and stenosis of bilateral carotid arteries: Secondary | ICD-10-CM | POA: Diagnosis not present

## 2015-03-27 DIAGNOSIS — Z9181 History of falling: Secondary | ICD-10-CM | POA: Diagnosis not present

## 2015-03-27 DIAGNOSIS — I517 Cardiomegaly: Secondary | ICD-10-CM | POA: Diagnosis not present

## 2015-03-28 DIAGNOSIS — M4800 Spinal stenosis, site unspecified: Secondary | ICD-10-CM | POA: Diagnosis not present

## 2015-03-28 DIAGNOSIS — M1991 Primary osteoarthritis, unspecified site: Secondary | ICD-10-CM | POA: Diagnosis not present

## 2015-03-28 DIAGNOSIS — H6091 Unspecified otitis externa, right ear: Secondary | ICD-10-CM | POA: Diagnosis not present

## 2015-03-28 DIAGNOSIS — S161XXA Strain of muscle, fascia and tendon at neck level, initial encounter: Secondary | ICD-10-CM | POA: Diagnosis not present

## 2015-04-01 DIAGNOSIS — Z96642 Presence of left artificial hip joint: Secondary | ICD-10-CM | POA: Diagnosis not present

## 2015-04-01 DIAGNOSIS — M25559 Pain in unspecified hip: Secondary | ICD-10-CM | POA: Diagnosis not present

## 2015-04-01 DIAGNOSIS — M25569 Pain in unspecified knee: Secondary | ICD-10-CM | POA: Diagnosis not present

## 2015-04-01 DIAGNOSIS — M6281 Muscle weakness (generalized): Secondary | ICD-10-CM | POA: Diagnosis not present

## 2015-04-01 DIAGNOSIS — R2681 Unsteadiness on feet: Secondary | ICD-10-CM | POA: Diagnosis not present

## 2015-04-01 DIAGNOSIS — M1990 Unspecified osteoarthritis, unspecified site: Secondary | ICD-10-CM | POA: Diagnosis not present

## 2015-04-01 DIAGNOSIS — M4800 Spinal stenosis, site unspecified: Secondary | ICD-10-CM | POA: Diagnosis not present

## 2015-04-03 DIAGNOSIS — M1990 Unspecified osteoarthritis, unspecified site: Secondary | ICD-10-CM | POA: Diagnosis not present

## 2015-04-03 DIAGNOSIS — M4800 Spinal stenosis, site unspecified: Secondary | ICD-10-CM | POA: Diagnosis not present

## 2015-04-03 DIAGNOSIS — R2681 Unsteadiness on feet: Secondary | ICD-10-CM | POA: Diagnosis not present

## 2015-04-03 DIAGNOSIS — M25559 Pain in unspecified hip: Secondary | ICD-10-CM | POA: Diagnosis not present

## 2015-04-03 DIAGNOSIS — M6281 Muscle weakness (generalized): Secondary | ICD-10-CM | POA: Diagnosis not present

## 2015-04-03 DIAGNOSIS — M25569 Pain in unspecified knee: Secondary | ICD-10-CM | POA: Diagnosis not present

## 2015-04-07 DIAGNOSIS — H6091 Unspecified otitis externa, right ear: Secondary | ICD-10-CM | POA: Diagnosis not present

## 2015-04-07 DIAGNOSIS — H9201 Otalgia, right ear: Secondary | ICD-10-CM | POA: Diagnosis not present

## 2015-04-08 DIAGNOSIS — R2681 Unsteadiness on feet: Secondary | ICD-10-CM | POA: Diagnosis not present

## 2015-04-08 DIAGNOSIS — M4800 Spinal stenosis, site unspecified: Secondary | ICD-10-CM | POA: Diagnosis not present

## 2015-04-08 DIAGNOSIS — M25569 Pain in unspecified knee: Secondary | ICD-10-CM | POA: Diagnosis not present

## 2015-04-08 DIAGNOSIS — M6281 Muscle weakness (generalized): Secondary | ICD-10-CM | POA: Diagnosis not present

## 2015-04-08 DIAGNOSIS — M1990 Unspecified osteoarthritis, unspecified site: Secondary | ICD-10-CM | POA: Diagnosis not present

## 2015-04-08 DIAGNOSIS — M25559 Pain in unspecified hip: Secondary | ICD-10-CM | POA: Diagnosis not present

## 2015-04-22 DIAGNOSIS — N182 Chronic kidney disease, stage 2 (mild): Secondary | ICD-10-CM | POA: Diagnosis not present

## 2015-04-22 DIAGNOSIS — M4800 Spinal stenosis, site unspecified: Secondary | ICD-10-CM | POA: Diagnosis not present

## 2015-04-22 DIAGNOSIS — M1612 Unilateral primary osteoarthritis, left hip: Secondary | ICD-10-CM | POA: Diagnosis not present

## 2015-04-22 DIAGNOSIS — R2681 Unsteadiness on feet: Secondary | ICD-10-CM | POA: Diagnosis not present

## 2015-04-22 DIAGNOSIS — M79605 Pain in left leg: Secondary | ICD-10-CM | POA: Diagnosis not present

## 2015-04-22 DIAGNOSIS — M1990 Unspecified osteoarthritis, unspecified site: Secondary | ICD-10-CM | POA: Diagnosis not present

## 2015-04-22 DIAGNOSIS — M6281 Muscle weakness (generalized): Secondary | ICD-10-CM | POA: Diagnosis not present

## 2015-04-22 DIAGNOSIS — M182 Bilateral post-traumatic osteoarthritis of first carpometacarpal joints: Secondary | ICD-10-CM | POA: Diagnosis not present

## 2015-04-22 DIAGNOSIS — E871 Hypo-osmolality and hyponatremia: Secondary | ICD-10-CM | POA: Diagnosis not present

## 2015-04-22 DIAGNOSIS — M1991 Primary osteoarthritis, unspecified site: Secondary | ICD-10-CM | POA: Diagnosis not present

## 2015-04-22 DIAGNOSIS — Z96641 Presence of right artificial hip joint: Secondary | ICD-10-CM | POA: Diagnosis not present

## 2015-04-22 DIAGNOSIS — M25559 Pain in unspecified hip: Secondary | ICD-10-CM | POA: Diagnosis not present

## 2015-04-22 DIAGNOSIS — Z96642 Presence of left artificial hip joint: Secondary | ICD-10-CM | POA: Diagnosis not present

## 2015-04-22 DIAGNOSIS — G40802 Other epilepsy, not intractable, without status epilepticus: Secondary | ICD-10-CM | POA: Diagnosis not present

## 2015-04-22 DIAGNOSIS — I10 Essential (primary) hypertension: Secondary | ICD-10-CM | POA: Diagnosis not present

## 2015-04-22 DIAGNOSIS — M79604 Pain in right leg: Secondary | ICD-10-CM | POA: Diagnosis not present

## 2015-04-22 DIAGNOSIS — M25569 Pain in unspecified knee: Secondary | ICD-10-CM | POA: Diagnosis not present

## 2015-04-29 DIAGNOSIS — M4800 Spinal stenosis, site unspecified: Secondary | ICD-10-CM | POA: Diagnosis not present

## 2015-04-29 DIAGNOSIS — N182 Chronic kidney disease, stage 2 (mild): Secondary | ICD-10-CM | POA: Diagnosis not present

## 2015-04-29 DIAGNOSIS — M25569 Pain in unspecified knee: Secondary | ICD-10-CM | POA: Diagnosis not present

## 2015-04-29 DIAGNOSIS — M25559 Pain in unspecified hip: Secondary | ICD-10-CM | POA: Diagnosis not present

## 2015-04-29 DIAGNOSIS — M6281 Muscle weakness (generalized): Secondary | ICD-10-CM | POA: Diagnosis not present

## 2015-04-29 DIAGNOSIS — M1990 Unspecified osteoarthritis, unspecified site: Secondary | ICD-10-CM | POA: Diagnosis not present

## 2015-04-29 DIAGNOSIS — R2681 Unsteadiness on feet: Secondary | ICD-10-CM | POA: Diagnosis not present

## 2015-04-29 DIAGNOSIS — E871 Hypo-osmolality and hyponatremia: Secondary | ICD-10-CM | POA: Diagnosis not present

## 2015-04-29 DIAGNOSIS — Z96642 Presence of left artificial hip joint: Secondary | ICD-10-CM | POA: Diagnosis not present

## 2015-05-07 DIAGNOSIS — M25569 Pain in unspecified knee: Secondary | ICD-10-CM | POA: Diagnosis not present

## 2015-05-07 DIAGNOSIS — M25559 Pain in unspecified hip: Secondary | ICD-10-CM | POA: Diagnosis not present

## 2015-05-07 DIAGNOSIS — M6281 Muscle weakness (generalized): Secondary | ICD-10-CM | POA: Diagnosis not present

## 2015-05-07 DIAGNOSIS — M4800 Spinal stenosis, site unspecified: Secondary | ICD-10-CM | POA: Diagnosis not present

## 2015-05-07 DIAGNOSIS — R2681 Unsteadiness on feet: Secondary | ICD-10-CM | POA: Diagnosis not present

## 2015-05-07 DIAGNOSIS — M1990 Unspecified osteoarthritis, unspecified site: Secondary | ICD-10-CM | POA: Diagnosis not present

## 2015-05-21 DIAGNOSIS — R2681 Unsteadiness on feet: Secondary | ICD-10-CM | POA: Diagnosis not present

## 2015-05-21 DIAGNOSIS — M4800 Spinal stenosis, site unspecified: Secondary | ICD-10-CM | POA: Diagnosis not present

## 2015-05-21 DIAGNOSIS — M6281 Muscle weakness (generalized): Secondary | ICD-10-CM | POA: Diagnosis not present

## 2015-05-21 DIAGNOSIS — M25569 Pain in unspecified knee: Secondary | ICD-10-CM | POA: Diagnosis not present

## 2015-05-21 DIAGNOSIS — M25559 Pain in unspecified hip: Secondary | ICD-10-CM | POA: Diagnosis not present

## 2015-05-21 DIAGNOSIS — M1990 Unspecified osteoarthritis, unspecified site: Secondary | ICD-10-CM | POA: Diagnosis not present

## 2015-05-22 DIAGNOSIS — M170 Bilateral primary osteoarthritis of knee: Secondary | ICD-10-CM | POA: Diagnosis not present

## 2015-05-22 DIAGNOSIS — M25561 Pain in right knee: Secondary | ICD-10-CM | POA: Diagnosis not present

## 2015-05-22 DIAGNOSIS — M1611 Unilateral primary osteoarthritis, right hip: Secondary | ICD-10-CM | POA: Diagnosis not present

## 2015-05-28 ENCOUNTER — Encounter: Payer: Self-pay | Admitting: Cardiology

## 2015-05-28 ENCOUNTER — Ambulatory Visit (INDEPENDENT_AMBULATORY_CARE_PROVIDER_SITE_OTHER): Payer: Medicare Other | Admitting: Cardiology

## 2015-05-28 VITALS — BP 155/75 | HR 61 | Ht 62.0 in | Wt 172.0 lb

## 2015-05-28 DIAGNOSIS — Z01818 Encounter for other preprocedural examination: Secondary | ICD-10-CM | POA: Diagnosis not present

## 2015-05-28 DIAGNOSIS — I2510 Atherosclerotic heart disease of native coronary artery without angina pectoris: Secondary | ICD-10-CM

## 2015-05-28 DIAGNOSIS — M4800 Spinal stenosis, site unspecified: Secondary | ICD-10-CM | POA: Diagnosis not present

## 2015-05-28 DIAGNOSIS — R0989 Other specified symptoms and signs involving the circulatory and respiratory systems: Secondary | ICD-10-CM

## 2015-05-28 DIAGNOSIS — M6281 Muscle weakness (generalized): Secondary | ICD-10-CM | POA: Diagnosis not present

## 2015-05-28 DIAGNOSIS — R2681 Unsteadiness on feet: Secondary | ICD-10-CM | POA: Diagnosis not present

## 2015-05-28 DIAGNOSIS — Z01810 Encounter for preprocedural cardiovascular examination: Secondary | ICD-10-CM | POA: Diagnosis not present

## 2015-05-28 DIAGNOSIS — M25559 Pain in unspecified hip: Secondary | ICD-10-CM | POA: Diagnosis not present

## 2015-05-28 DIAGNOSIS — M1990 Unspecified osteoarthritis, unspecified site: Secondary | ICD-10-CM | POA: Diagnosis not present

## 2015-05-28 DIAGNOSIS — M25569 Pain in unspecified knee: Secondary | ICD-10-CM | POA: Diagnosis not present

## 2015-05-28 DIAGNOSIS — R9430 Abnormal result of cardiovascular function study, unspecified: Secondary | ICD-10-CM

## 2015-05-28 NOTE — Progress Notes (Signed)
Cardiology Office Note   Date:  05/28/2015   ID:  Carrie Gillespie, DOB 04-12-1920, MRN [id number]  PCP:  Curlene Labrum, MD  Cardiologist:  Dola Argyle, MD   Chief Complaint  Patient presents with  . Appointment    Follow-up coronary disease      History of Present Illness: Carrie Gillespie is a 81 y.o. female who presents today to follow-up coronary disease and for preop clearance for right hip surgery. I saw her last March, 2016. At that time she was cleared for left hip surgery. This was done successfully and she is doing well. She is incapacitated by right hip pain. She had a nuclear scan that was negative in 2012.  Past Medical History  Diagnosis Date  . Other and unspecified hyperlipidemia     takes Pravastatin daily  . Coronary atherosclerosis of native coronary artery     Status post stent to right coronary 2004, catheterization 2005 nonobstructive CAD, normal LV function, Cardiolite study 2008 negative for ischemia. Residual moderate ostial RCA disease. Stress testing 2012 negative for ischemia ejection fraction 78%  . Nerve entrapment syndrome of lower extremity      status post prior back surgery and weakness in left leg  . Arthritis   . Unspecified essential hypertension     takes Lisinopril,Metoprolol,and Imdur daily  . Depression     takes Cymbalta daily  . GERD (gastroesophageal reflux disease)     takes Omeprazole daily  . History of bronchitis     > 13yrs ago   . Seizures     takes Tegretol daily;has only had 1 seizure and that was several yrs ago.  . Peripheral neuropathy   . Joint pain   . Diverticulosis   . History of blood transfusion     no abnormal reaction noted  . Macular degeneration     dry  . Peripheral vascular disease, unspecified     Minimal internal carotid artery disease  . Carotid artery disease     with an occluded right vertebral artery, otherwise nonobstructive carotid artery disease    Past Surgical History  Procedure  Laterality Date  . Kidney surgery  2010    1/4 of right kidney removed [facility]  . Cardiac catheterization      nonobstructive coronary artery disease  . Cataract extraction    . Knee arthroplasty Left 2004  . Partial hysterectomy    . Coronary angioplasty  1998-1999  . Colonoscopy    . Back surgery  10/14  . Total hip arthroplasty Left 12/24/2014    Procedure: TOTAL HIP ARTHROPLASTY;  Surgeon: Garald Balding, MD;  Location: [facility];  Service: Orthopedics;  Laterality: Left;    Patient Active Problem List   Diagnosis Date Noted  . Primary osteoarthritis of left hip 12/24/2014  . S/P total hip arthroplasty 12/24/2014  . Pre-operative cardiovascular examination 11/28/2014  . Nausea with vomiting 07/29/2013  . UTI (urinary tract infection) 07/29/2013  . Bradycardia 07/29/2013  . Status post lumbar laminectomy 07/29/2013  . Syncope 07/25/2013  . Ejection fraction   . Otitis externa 12/16/2011  . Neuropathic pain of lower extremity 12/16/2011  . Nerve entrapment syndrome of lower extremity   . Hyponatremia 06/21/2011  . Insomnia 06/21/2011  . Peripheral vascular disease, unspecified   . Coronary atherosclerosis of native coronary artery   . Carotid artery disease   . HYPERLIPIDEMIA-MIXED 05/23/2009  . HYPERTENSION, UNSPECIFIED 05/23/2009      Current Outpatient Prescriptions  Medication Sig Dispense  Refill  . aspirin 81 MG tablet Take 81 mg by mouth daily.    . carbamazepine (TEGRETOL XR) 200 MG 12 hr tablet Take 200 mg by mouth daily.      . diphenhydrAMINE (BENADRYL) 25 MG tablet Take 25 mg by mouth every 6 (six) hours as needed for allergies.    . DULoxetine (CYMBALTA) 60 MG capsule Take 60 mg by mouth daily.    Marland Kitchen lisinopril (PRINIVIL,ZESTRIL) 10 MG tablet Take 10 mg by mouth daily.     . metoprolol succinate (TOPROL-XL) 50 MG 24 hr tablet Take 50 mg by mouth daily. Take with or immediately following a meal.    . nitroGLYCERIN (NITROSTAT) 0.4 MG SL tablet Place 0.4 mg  under the tongue every 5 (five) minutes as needed for chest pain.     Marland Kitchen omeprazole (PRILOSEC) 20 MG capsule Take 20 mg by mouth daily.      . pravastatin (PRAVACHOL) 40 MG tablet Take 40 mg by mouth daily.    . isosorbide mononitrate (IMDUR) 120 MG 24 hr tablet Take 120 mg by mouth daily.     No current facility-administered medications for this visit.    Allergies:   Latex    Social History:  The patient  reports that she has never smoked. She has never used smokeless tobacco. She reports that she does not drink alcohol or use illicit drugs.   Family History:  The patient's family history includes Cancer in her mother; Diabetes in her mother and other; Hypertension in her other; Kidney disease in her sister.    ROS:  Please see the history of present illness.   Patient denies fever, chills, headache, sweats, rash, change in vision, change in hearing, chest pain, cough, nausea or vomiting, urinary symptoms. All other systems are reviewed and are negative other than the history of present illness.   PHYSICAL EXAM: VS:  BP 155/75 mmHg  Pulse 61  Ht 5\' 2"  (1.575 m)  Wt 146 lb (66.225 kg)  BMI 26.70 kg/m2  SpO2 95% , Patient's here with her son. She is oriented to person time and place. Affect is normal. Head is atraumatic. Sclera and conjunctiva are normal. There is no jugular venous distention. Lungs are clear. Respiratory effort is not labored. Cardiac exam reveals S1 and S2. There are no clicks or significant murmurs. Abdomen is soft. There is no peripheral edema. There are no skin rashes.  EKG:   EKG is done today and reviewed by me. There is sinus rhythm. There is decreased anterior R-wave progression. There is no change from the past.   Recent Labs: 12/18/2014: ALT 18 12/27/2014: BUN 18; Creatinine, Ser 0.71; Hemoglobin 8.2*; Platelets 199; Potassium 4.1; Sodium 127*    Lipid Panel No results found for: CHOL, TRIG, HDL, CHOLHDL, VLDL, LDLCALC, LDLDIRECT    Wt Readings from  Last 3 Encounters:  05/28/15 146 lb (66.225 kg)  12/18/14 176 lb (79.833 kg)  11/28/14 176 lb (79.833 kg)      Current medicines are reviewed  Patient understands her medications.     ASSESSMENT AND PLAN:

## 2015-05-28 NOTE — Assessment & Plan Note (Signed)
EF is 60%, no further workup.

## 2015-05-28 NOTE — Assessment & Plan Note (Signed)
Coronary disease is stable. No change in therapy. 

## 2015-05-28 NOTE — Patient Instructions (Signed)
Your physician recommends that you continue on your current medications as directed. Please refer to the Current Medication list given to you today. Your physician recommends that you schedule a follow-up appointment in: 6 months with Dr. Harl Bowie. You will receive a reminder letter in the mail in about 4 months reminding you to call and schedule your appointment. If you don't receive this letter, please contact our office.

## 2015-05-28 NOTE — Assessment & Plan Note (Signed)
The patient had successful left hip surgery March, 2016. She is limited by right hip pain. She is cleared from the cardiac viewpoint for right hip surgery. The patient looks and ask younger than her stated age.

## 2015-06-06 DIAGNOSIS — N182 Chronic kidney disease, stage 2 (mild): Secondary | ICD-10-CM | POA: Diagnosis not present

## 2015-06-06 DIAGNOSIS — E782 Mixed hyperlipidemia: Secondary | ICD-10-CM | POA: Diagnosis not present

## 2015-06-06 DIAGNOSIS — K219 Gastro-esophageal reflux disease without esophagitis: Secondary | ICD-10-CM | POA: Diagnosis not present

## 2015-06-06 DIAGNOSIS — I10 Essential (primary) hypertension: Secondary | ICD-10-CM | POA: Diagnosis not present

## 2015-06-11 DIAGNOSIS — G2581 Restless legs syndrome: Secondary | ICD-10-CM | POA: Diagnosis not present

## 2015-06-11 DIAGNOSIS — N182 Chronic kidney disease, stage 2 (mild): Secondary | ICD-10-CM | POA: Diagnosis not present

## 2015-06-11 DIAGNOSIS — F328 Other depressive episodes: Secondary | ICD-10-CM | POA: Diagnosis not present

## 2015-06-11 DIAGNOSIS — M1611 Unilateral primary osteoarthritis, right hip: Secondary | ICD-10-CM | POA: Diagnosis not present

## 2015-06-11 DIAGNOSIS — E871 Hypo-osmolality and hyponatremia: Secondary | ICD-10-CM | POA: Diagnosis not present

## 2015-06-11 DIAGNOSIS — M1612 Unilateral primary osteoarthritis, left hip: Secondary | ICD-10-CM | POA: Diagnosis not present

## 2015-06-11 DIAGNOSIS — Z23 Encounter for immunization: Secondary | ICD-10-CM | POA: Diagnosis not present

## 2015-06-11 DIAGNOSIS — I10 Essential (primary) hypertension: Secondary | ICD-10-CM | POA: Diagnosis not present

## 2015-06-11 DIAGNOSIS — M4800 Spinal stenosis, site unspecified: Secondary | ICD-10-CM | POA: Diagnosis not present

## 2015-06-11 DIAGNOSIS — Z96642 Presence of left artificial hip joint: Secondary | ICD-10-CM | POA: Diagnosis not present

## 2015-06-11 DIAGNOSIS — E782 Mixed hyperlipidemia: Secondary | ICD-10-CM | POA: Diagnosis not present

## 2015-06-11 DIAGNOSIS — K219 Gastro-esophageal reflux disease without esophagitis: Secondary | ICD-10-CM | POA: Diagnosis not present

## 2015-06-16 ENCOUNTER — Ambulatory Visit: Payer: Medicare Other | Admitting: Cardiology

## 2015-06-25 DIAGNOSIS — M1611 Unilateral primary osteoarthritis, right hip: Secondary | ICD-10-CM | POA: Diagnosis not present

## 2015-06-25 DIAGNOSIS — M25561 Pain in right knee: Secondary | ICD-10-CM | POA: Diagnosis not present

## 2015-06-27 ENCOUNTER — Encounter (HOSPITAL_COMMUNITY)
Admission: RE | Admit: 2015-06-27 | Discharge: 2015-06-27 | Disposition: A | Discharge status: Home / Self Care | Payer: Medicare Other | Source: Ambulatory Visit | Attending: Orthopaedic Surgery | Admitting: Orthopaedic Surgery

## 2015-06-27 ENCOUNTER — Encounter (HOSPITAL_COMMUNITY): Payer: Self-pay

## 2015-06-27 DIAGNOSIS — M1611 Unilateral primary osteoarthritis, right hip: Secondary | ICD-10-CM | POA: Insufficient documentation

## 2015-06-27 DIAGNOSIS — Z7901 Long term (current) use of anticoagulants: Secondary | ICD-10-CM | POA: Insufficient documentation

## 2015-06-27 DIAGNOSIS — Z01818 Encounter for other preprocedural examination: Secondary | ICD-10-CM | POA: Diagnosis not present

## 2015-06-27 HISTORY — DX: Cough, unspecified: R05.9

## 2015-06-27 HISTORY — DX: Anxiety disorder, unspecified: F41.9

## 2015-06-27 HISTORY — DX: Cough: R05

## 2015-06-27 LAB — URINALYSIS, ROUTINE W REFLEX MICROSCOPIC
Glucose, UA: NEGATIVE mg/dL
HGB URINE DIPSTICK: NEGATIVE
Ketones, ur: NEGATIVE mg/dL
Nitrite: NEGATIVE
PH: 5 (ref 5.0–8.0)
Protein, ur: NEGATIVE mg/dL
SPECIFIC GRAVITY, URINE: 1.034 — AB (ref 1.005–1.030)
UROBILINOGEN UA: 0.2 mg/dL (ref 0.0–1.0)

## 2015-06-27 LAB — COMPREHENSIVE METABOLIC PANEL
ALBUMIN: 4 g/dL (ref 3.5–5.0)
ALT: 16 U/L (ref 14–54)
ANION GAP: 8 (ref 5–15)
AST: 21 U/L (ref 15–41)
Alkaline Phosphatase: 62 U/L (ref 38–126)
BILIRUBIN TOTAL: 0.1 mg/dL — AB (ref 0.3–1.2)
BUN: 21 mg/dL — ABNORMAL HIGH (ref 6–20)
CO2: 26 mmol/L (ref 22–32)
Calcium: 9.4 mg/dL (ref 8.9–10.3)
Chloride: 100 mmol/L — ABNORMAL LOW (ref 101–111)
Creatinine, Ser: 1.06 mg/dL — ABNORMAL HIGH (ref 0.44–1.00)
GFR calc Af Amer: 51 mL/min — ABNORMAL LOW (ref >60)
GFR calc non Af Amer: 44 mL/min — ABNORMAL LOW (ref >60)
GLUCOSE: 100 mg/dL — AB (ref 65–99)
POTASSIUM: 4.4 mmol/L (ref 3.5–5.1)
SODIUM: 134 mmol/L — AB (ref 135–145)
TOTAL PROTEIN: 6.9 g/dL (ref 6.5–8.1)

## 2015-06-27 LAB — SURGICAL PCR SCREEN
MRSA, PCR: NEGATIVE
Staphylococcus aureus: NEGATIVE

## 2015-06-27 LAB — TYPE AND SCREEN
ABO/RH(D): A POS
ANTIBODY SCREEN: NEGATIVE

## 2015-06-27 LAB — CBC WITH DIFFERENTIAL/PLATELET
BASOS PCT: 1 %
Basophils Absolute: 0.1 10*3/uL (ref 0.0–0.1)
EOS ABS: 0.3 10*3/uL (ref 0.0–0.7)
EOS PCT: 4 %
HEMATOCRIT: 36 % (ref 36.0–46.0)
Hemoglobin: 11.6 g/dL — ABNORMAL LOW (ref 12.0–15.0)
Lymphocytes Relative: 49 %
Lymphs Abs: 4.1 10*3/uL — ABNORMAL HIGH (ref 0.7–4.0)
MCH: 29.4 pg (ref 26.0–34.0)
MCHC: 32.2 g/dL (ref 30.0–36.0)
MCV: 91.4 fL (ref 78.0–100.0)
MONO ABS: 0.8 10*3/uL (ref 0.1–1.0)
MONOS PCT: 10 %
Neutro Abs: 3 10*3/uL (ref 1.7–7.7)
Neutrophils Relative %: 36 %
PLATELETS: 235 10*3/uL (ref 150–400)
RBC: 3.94 MIL/uL (ref 3.87–5.11)
RDW: 14.4 % (ref 11.5–15.5)
WBC: 8.4 10*3/uL (ref 4.0–10.5)

## 2015-06-27 LAB — URINE MICROSCOPIC-ADD ON

## 2015-06-27 LAB — PROTIME-INR
INR: 1 (ref 0.00–1.49)
PROTHROMBIN TIME: 13.4 s (ref 11.6–15.2)

## 2015-06-27 LAB — APTT: aPTT: 32 seconds (ref 24–37)

## 2015-06-27 NOTE — Pre-Procedure Instructions (Signed)
    AASHNI LUFF  06/27/2015      [id number] - [place], [place] - [address] [place] [address] Phone: [phone] Fax: [phone]    Your procedure is scheduled on 07/08/15.  Report to [facility] Admitting at 530 A.M.  Call this number if you have problems the morning of surgery:  [phone]   Remember:  Do not eat food or drink liquids after midnight.  Take these medicines the morning of surgery with A SIP OF WATER --tegretol.cymbalta,hydrocodone,imdur,metoprolol.prilosec   Do not wear jewelry, make-up or nail polish.  Do not wear lotions, powders, or perfumes.  You may wear deodorant.  Do not shave 48 hours prior to surgery.  Men may shave face and neck.  Do not bring valuables to the hospital.  [facility] is not responsible for any belongings or valuables.  Contacts, dentures or bridgework may not be worn into surgery.  Leave your suitcase in the car.  After surgery it may be brought to your room.  For patients admitted to the hospital, discharge time will be determined by your treatment team.  Patients discharged the day of surgery will not be allowed to drive home.   Name and phone number of your driver:  Special instructions:    Please read over the following fact sheets that you were given. Pain Booklet, Coughing and Deep Breathing, Blood Transfusion Information, MRSA Information and Surgical Site Infection Prevention

## 2015-06-27 NOTE — Progress Notes (Signed)
Anesthesia Chart Review:  Pt is 81 year old female scheduled for R total hip arthroplasty on 07/08/2015 with Dr. Durward Fortes.   Patient lives in [place], [place]. PCP is listed as Dr. Judd Lien. Cardiologist is Dr. Dola Argyle, last visit 05/28/15; notes indicate Dr. Ron Parker cleared pt for surgery from cardiac standpoint.   PMH includes: HTN, HLD, CAD (s/p mid RCA stent 2000), right partial nephrectomy ([facility], 2010), mild carotid stenosis (0-39% in 2014), GERD, seizures, depression, peripheral neuropathy, macular degeneration, left TKA 2004, partial hysterectomy. S/p L THA 12/24/14. S/p L3-4 PLIF 07/24/13.    Medications include: ASA, tegretol, imdur, lisinopril, metoprolol, prilosec, prilosec.   Preoperative labs reviewed.    CXR 12/18/2014: Chronic bronchitic changes. No active disease.  EKG 05/28/2015: sinus rhythm. Decreased anterior R-wave progression. No change from previous per Dr. Kae Heller interpretation.   06/13/14 Echo ([facility]; Dr. Hamilton Capri at [facility]): LV is normal in size, wall thickness, and wall motion with EF of 60-65%. LV diastolic function is abnormal. LA/RA mildly dilated. Mild 1+ MR. Pulmonary hypertension is not suggested by Doppler findings. Mild aortic sclerosis is present with good valvular opening.  Nuclear stress test on 05/25/11 [facility]; scanned under Media tab 07/24/13 Encounter) showed probably normal LV perfusion. Limitations and artifact were due to breast tissue. Mild, apical anteroseptal defect that is fixed, suggestive of soft tissue attenuation. No large area os ischemia. Calculated post stress LVEF was 78%.  Her last cardiac cath was on 09/06/03 and showed normal LV systolic function, patent mid RCA stent with less than 20% stenosis, 70-75% ostial stenosis of the RCA, 30% proximal RCA. Normal LM. 40% proximal LAD, 25% mid LAD, 40% ostial D1. LCx with small OM and very large ramus with 30% proximal stenosis.  07/29/13 Carotid  duplex: Right: moderate intimal wall thickening CCA. Mild mixed plaque origin ICA. Left: mild sof tplaque distal CCA. Mild calcific plaque origin ICA with acoustic shadowing. Bilateral: 1-39% ICA stenosis. Vertebral artery flow is antegrade. ICA/CCA ratio: R-0.59 L-1.0.  She tolerated hip replacement surgery without issue back in March.  If no changes, I anticipate pt can proceed with surgery as scheduled.   Willeen Cass, FNP-BC [facility]/Anesthesiology Phone: [phone] 06/27/2015 3:12 PM

## 2015-07-03 NOTE — H&P (Signed)
CHIEF COMPLAINT:  Painful right hip.    HISTORY OF PRESENT ILLNESS:  Carrie Gillespie is a very pleasant 81 year old white female who is seen today for evaluation of her right hip.  She is status post left total hip arthroplasty with a very good result.  However, she had noted recently, in August, worsening pain and problems with the right lower extremity with her right knee and right thigh.  X-rays had been obtained of the pelvis, which revealed significant degenerative changes and AVN of the right hip.  She has subchondral sclerosis and complete collapse of the joint space.  She did have some degenerative changes of her right knee.  She continued to have pain in the thigh and knee area, which has been worsening.  It has now gotten to the point where she is having pain with every step, has to use a walker and is noting to fall at times because of this discomfort and instability of her right hip.  She is now to the point where she has pain with every step, nighttime pain, pain with activities of daily living.  She is unable to find a comfortable position in the bed or with sitting.  Seen today for evaluation.     PAST MEDICAL HISTORY:  In general, health is fair.     PAST SURGICAL HISTORY:   Hospitalizations:  1.  In 1966 for vaginal hysterectomy. 2.  In 1990s for oophorectomy.  3.  In 2005 for left total knee arthroplasty by Dr. Amado Coe with continued pain and discomfort.  4.  In 2010, partial nephrectomy.  5.  In 2005 and 2014, lumbar spine surgery. 6.  In 2016, left total hip arthroplasty.  7.  In 1942, 1947 and 1954, childbirth x3. 8.  Cataract excision x2.    CURRENT MEDICATIONS: 1.    Aspirin 81 mg daily. 2.    Equate allergy relief 25 mg daily.   3.    Tegretol 200 mg daily for seizures. 4.    Isosorbide mono ER 120 mg daily. 5.    Ibuprofen 600 mg daily. 6.    Lisinopril 10 mg daily. 7.    Metoprolol 50 mg daily.  8.    Pravastatin 40 mg at bedtime. 9.    Prilosec 20.6 mg b.i.d.  10.   Duloxetine 60 mg daily.  11.  Tramadol 50 mg p.r.n.  12.  Multivitamin.    ALLERGIES:  Latex.   REVIEW OF SYSTEMS:  A 14-point review of systems is positive for glasses and decreased hearing.  She has had bilateral cataract extractions.  Denies any chest pain, but does have heart disease with a stent.  No history of strokes at this time noted.  She does have hypertension in the past, but apparently is normal now.  She is on medication for this.  She has had mini-seizures in the past, is on Tegretol.  She does have a history of depression.    FAMILY HISTORY:  Positive for a mother who is deceased at age 56 from colon cancer.  She had heart disease and diabetes.  She had a father who was killed at a very young age from a motor vehicle accident.  She has one living brother at age 83.  She has one brother at age 68 deceased and had been on dialysis and had pancreatic carcinoma.  She has also one other brother who died in his 34s from lung cancer with metastasis.  She has two sisters, one is deceased at age 16  and apparently was on dialysis also.  She has one sister 9 years on dialysis and is age 28.     SOCIAL HISTORY:  Carrie Gillespie is a very pleasant 81 year old white widowed female.  Retired [employer] manager.  She denies use of tobacco or alcohol.     PHYSICAL EXAMINATION:  In general, she is a 81 year old white female, well developed, well nourished, alert and cooperative, in moderate distress secondary to right hip pain.  Height 5 feet 2 inches, weight 172, BMI 31.5.  Vital signs:  Temperature 98.8, pulse 82, respirations 16, blood pressure 140/72.  Head:  Normocephalic.  Eyes:  Pupils equal, round and reactive to light and accommodation with extraocular movements intact.  Ears, nose and throat:  Benign. Chest:  Good expansion.  Decreased breath sounds, but were clear. Cardiac:   Regular rhythm and rate with occasional ectopic.  There was a grade [id number] systolic murmur with distant heart sounds.    Pulses:   Trace, bilateral and symmetric in the dorsalis pedis bilaterally.     Abdomen:  Obese, soft, nontender.  No masses palpable.  Normal bowel sounds present.   Genitorectal/breast:  Exam not indicated for an orthopedic evaluation.   CNS:  Oriented x3.  Cranial nerves II-XII grossly intact.   Musculoskeletal:  She has decreased range of motion of the right hip.  She has minimal internal/external rotation.  She can sit comfortably with the hip to 90 degrees, but certainly painful with any range of motion.     CLINICAL IMPRESSION:   1.  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   2.  Status post left total hip arthroplasty.  3.  History of mini-seizures.  4.  Cardiac coronary artery disease with stent.    RECOMMENDATIONS:   At this time, I reviewed a note from Dr. Dola Argyle, stating that from cardiac standpoint, she was cleared for right total hip arthroplasty.  This was on the basis of a successful left hip replacement in March 2016 and the fact that she looks and acts younger than her stated age.  I have also reviewed a note from Dr. Pleas Koch, who felt that she is a moderate risk for a medium-risk surgery.  However, as she did well with her left total hip arthroplasty, then he felt from a medical standpoint, she was a candidate for total hip replacement.  Therefore, our plan is to proceed with a right total hip arthroplasty.  Procedure risks and benefits were fully explained to her again, as well as her daughter, at this visit.  All questions were answered.  We are going to proceed with a total hip arthroplasty on the right in the very near future.    No Changes   Mike Craze. [place], [place] [phone]  07/07/2015 3:36 PM

## 2015-07-08 ENCOUNTER — Inpatient Hospital Stay (HOSPITAL_COMMUNITY): Payer: Medicare Other | Admitting: Emergency Medicine

## 2015-07-08 ENCOUNTER — Inpatient Hospital Stay (HOSPITAL_COMMUNITY): Payer: Medicare Other

## 2015-07-08 ENCOUNTER — Inpatient Hospital Stay (HOSPITAL_COMMUNITY): Payer: Medicare Other | Admitting: Anesthesiology

## 2015-07-08 ENCOUNTER — Encounter (HOSPITAL_COMMUNITY)
Admission: RE | Disposition: A | Discharge status: Skilled Nursing Facility | Payer: Self-pay | Source: Ambulatory Visit | Attending: Orthopaedic Surgery

## 2015-07-08 ENCOUNTER — Encounter (HOSPITAL_COMMUNITY): Payer: Self-pay | Admitting: *Deleted

## 2015-07-08 ENCOUNTER — Inpatient Hospital Stay (HOSPITAL_COMMUNITY)
Admission: RE | Admit: 2015-07-08 | Discharge: 2015-07-11 | DRG: 470 | Disposition: A | Discharge status: Skilled Nursing Facility | Payer: Medicare Other | Source: Ambulatory Visit | Attending: Orthopaedic Surgery | Admitting: Orthopaedic Surgery

## 2015-07-08 DIAGNOSIS — M6281 Muscle weakness (generalized): Secondary | ICD-10-CM | POA: Diagnosis not present

## 2015-07-08 DIAGNOSIS — D62 Acute posthemorrhagic anemia: Secondary | ICD-10-CM | POA: Diagnosis not present

## 2015-07-08 DIAGNOSIS — F329 Major depressive disorder, single episode, unspecified: Secondary | ICD-10-CM | POA: Diagnosis present

## 2015-07-08 DIAGNOSIS — M879 Osteonecrosis, unspecified: Secondary | ICD-10-CM | POA: Diagnosis present

## 2015-07-08 DIAGNOSIS — I739 Peripheral vascular disease, unspecified: Secondary | ICD-10-CM | POA: Diagnosis present

## 2015-07-08 DIAGNOSIS — Z79899 Other long term (current) drug therapy: Secondary | ICD-10-CM | POA: Diagnosis not present

## 2015-07-08 DIAGNOSIS — G629 Polyneuropathy, unspecified: Secondary | ICD-10-CM | POA: Diagnosis present

## 2015-07-08 DIAGNOSIS — E785 Hyperlipidemia, unspecified: Secondary | ICD-10-CM | POA: Diagnosis present

## 2015-07-08 DIAGNOSIS — R569 Unspecified convulsions: Secondary | ICD-10-CM | POA: Diagnosis present

## 2015-07-08 DIAGNOSIS — I2510 Atherosclerotic heart disease of native coronary artery without angina pectoris: Secondary | ICD-10-CM | POA: Diagnosis present

## 2015-07-08 DIAGNOSIS — M1611 Unilateral primary osteoarthritis, right hip: Principal | ICD-10-CM | POA: Diagnosis present

## 2015-07-08 DIAGNOSIS — G589 Mononeuropathy, unspecified: Secondary | ICD-10-CM | POA: Diagnosis not present

## 2015-07-08 DIAGNOSIS — H409 Unspecified glaucoma: Secondary | ICD-10-CM | POA: Diagnosis not present

## 2015-07-08 DIAGNOSIS — F419 Anxiety disorder, unspecified: Secondary | ICD-10-CM | POA: Diagnosis present

## 2015-07-08 DIAGNOSIS — Z7982 Long term (current) use of aspirin: Secondary | ICD-10-CM

## 2015-07-08 DIAGNOSIS — H3530 Unspecified macular degeneration: Secondary | ICD-10-CM | POA: Diagnosis present

## 2015-07-08 DIAGNOSIS — K219 Gastro-esophageal reflux disease without esophagitis: Secondary | ICD-10-CM | POA: Diagnosis present

## 2015-07-08 DIAGNOSIS — Z96642 Presence of left artificial hip joint: Secondary | ICD-10-CM | POA: Diagnosis present

## 2015-07-08 DIAGNOSIS — M25551 Pain in right hip: Secondary | ICD-10-CM | POA: Diagnosis not present

## 2015-07-08 DIAGNOSIS — Z96649 Presence of unspecified artificial hip joint: Secondary | ICD-10-CM

## 2015-07-08 DIAGNOSIS — Z955 Presence of coronary angioplasty implant and graft: Secondary | ICD-10-CM

## 2015-07-08 DIAGNOSIS — K5900 Constipation, unspecified: Secondary | ICD-10-CM | POA: Diagnosis not present

## 2015-07-08 DIAGNOSIS — R262 Difficulty in walking, not elsewhere classified: Secondary | ICD-10-CM | POA: Diagnosis not present

## 2015-07-08 DIAGNOSIS — M169 Osteoarthritis of hip, unspecified: Secondary | ICD-10-CM | POA: Diagnosis not present

## 2015-07-08 DIAGNOSIS — Z471 Aftercare following joint replacement surgery: Secondary | ICD-10-CM | POA: Diagnosis not present

## 2015-07-08 DIAGNOSIS — Z96641 Presence of right artificial hip joint: Secondary | ICD-10-CM | POA: Diagnosis not present

## 2015-07-08 DIAGNOSIS — Z9889 Other specified postprocedural states: Secondary | ICD-10-CM

## 2015-07-08 DIAGNOSIS — I10 Essential (primary) hypertension: Secondary | ICD-10-CM | POA: Diagnosis present

## 2015-07-08 DIAGNOSIS — E871 Hypo-osmolality and hyponatremia: Secondary | ICD-10-CM | POA: Diagnosis not present

## 2015-07-08 DIAGNOSIS — K5790 Diverticulosis of intestine, part unspecified, without perforation or abscess without bleeding: Secondary | ICD-10-CM | POA: Diagnosis not present

## 2015-07-08 HISTORY — PX: TOTAL HIP ARTHROPLASTY: SHX124

## 2015-07-08 LAB — GLUCOSE, CAPILLARY: Glucose-Capillary: 108 mg/dL — ABNORMAL HIGH (ref 65–99)

## 2015-07-08 SURGERY — ARTHROPLASTY, HIP, TOTAL,POSTERIOR APPROACH
Anesthesia: Monitor Anesthesia Care | Site: Hip | Laterality: Right

## 2015-07-08 MED ORDER — SODIUM CHLORIDE 0.9 % IR SOLN
Status: DC | PRN
  Administered 2015-07-08: 1000 mL

## 2015-07-08 MED ORDER — MAGNESIUM CITRATE PO SOLN: 1.0000 | Freq: Once | ORAL | Status: DC | PRN

## 2015-07-08 MED ORDER — FENTANYL CITRATE (PF) 250 MCG/5ML IJ SOLN
INTRAMUSCULAR | Status: AC
  Filled 2015-07-08: qty 5

## 2015-07-08 MED ORDER — OXYCODONE HCL 5 MG PO TABS
ORAL_TABLET | ORAL | Status: AC
  Filled 2015-07-08: qty 1

## 2015-07-08 MED ORDER — METOPROLOL SUCCINATE ER 50 MG PO TB24
50.0000 mg | ORAL_TABLET | Freq: Every day | ORAL | Status: DC
  Administered 2015-07-09 – 2015-07-11 (×3): 50 mg via ORAL
  Filled 2015-07-08 (×3): qty 1

## 2015-07-08 MED ORDER — METOCLOPRAMIDE HCL 5 MG/ML IJ SOLN: 5.0000 mg | Freq: Three times a day (TID) | INTRAMUSCULAR | Status: DC | PRN

## 2015-07-08 MED ORDER — DOCUSATE SODIUM 100 MG PO CAPS
100.0000 mg | ORAL_CAPSULE | Freq: Two times a day (BID) | ORAL | Status: DC
  Administered 2015-07-08 – 2015-07-11 (×6): 100 mg via ORAL
  Filled 2015-07-08 (×6): qty 1

## 2015-07-08 MED ORDER — METOCLOPRAMIDE HCL 5 MG PO TABS: 5.0000 mg | ORAL_TABLET | Freq: Three times a day (TID) | ORAL | Status: DC | PRN

## 2015-07-08 MED ORDER — CARBAMAZEPINE ER 200 MG PO TB12
200.0000 mg | ORAL_TABLET | Freq: Every day | ORAL | Status: DC
  Administered 2015-07-09 – 2015-07-11 (×3): 200 mg via ORAL
  Filled 2015-07-08 (×3): qty 1

## 2015-07-08 MED ORDER — HYDROCODONE-ACETAMINOPHEN 7.5-325 MG PO TABS
1.0000 | ORAL_TABLET | ORAL | Status: DC | PRN
  Administered 2015-07-08 – 2015-07-11 (×10): 1 via ORAL
  Filled 2015-07-08 (×10): qty 1

## 2015-07-08 MED ORDER — LISINOPRIL 10 MG PO TABS
10.0000 mg | ORAL_TABLET | Freq: Every day | ORAL | Status: DC
  Administered 2015-07-08 – 2015-07-11 (×4): 10 mg via ORAL
  Filled 2015-07-08 (×4): qty 1

## 2015-07-08 MED ORDER — BUPIVACAINE-EPINEPHRINE (PF) 0.25% -1:200000 IJ SOLN
INTRAMUSCULAR | Status: AC
  Filled 2015-07-08: qty 30

## 2015-07-08 MED ORDER — ALUM & MAG HYDROXIDE-SIMETH 200-200-20 MG/5ML PO SUSP
30.0000 mL | ORAL | Status: DC | PRN
  Administered 2015-07-09 (×2): 30 mL via ORAL
  Filled 2015-07-08 (×2): qty 30

## 2015-07-08 MED ORDER — METHOCARBAMOL 1000 MG/10ML IJ SOLN: 500.0000 mg | Freq: Four times a day (QID) | INTRAVENOUS | Status: DC | PRN

## 2015-07-08 MED ORDER — ONDANSETRON HCL 4 MG/2ML IJ SOLN: 4.0000 mg | Freq: Four times a day (QID) | INTRAMUSCULAR | Status: DC | PRN

## 2015-07-08 MED ORDER — FENTANYL CITRATE (PF) 100 MCG/2ML IJ SOLN
INTRAMUSCULAR | Status: DC | PRN
  Administered 2015-07-08: 50 ug via INTRAVENOUS

## 2015-07-08 MED ORDER — PROPOFOL 500 MG/50ML IV EMUL
INTRAVENOUS | Status: DC | PRN
  Administered 2015-07-08: 25 ug/kg/min via INTRAVENOUS

## 2015-07-08 MED ORDER — BISACODYL 10 MG RE SUPP: 10.0000 mg | Freq: Every day | RECTAL | Status: DC | PRN

## 2015-07-08 MED ORDER — OXYCODONE HCL 5 MG PO TABS
ORAL_TABLET | ORAL | Status: AC
  Administered 2015-07-08: 5 mg via ORAL
  Filled 2015-07-08: qty 1

## 2015-07-08 MED ORDER — HYDROCODONE-ACETAMINOPHEN 7.5-325 MG PO TABS: 1.0000 | ORAL_TABLET | Freq: Four times a day (QID) | ORAL | Status: DC | PRN

## 2015-07-08 MED ORDER — SODIUM CHLORIDE 0.9 % IV SOLN: 75.0000 mL/h | INTRAVENOUS | Status: DC

## 2015-07-08 MED ORDER — MENTHOL 3 MG MT LOZG: 1.0000 | LOZENGE | OROMUCOSAL | Status: DC | PRN

## 2015-07-08 MED ORDER — DIPHENHYDRAMINE HCL 25 MG PO TABS
25.0000 mg | ORAL_TABLET | Freq: Four times a day (QID) | ORAL | Status: DC | PRN
  Filled 2015-07-08: qty 1

## 2015-07-08 MED ORDER — PANTOPRAZOLE SODIUM 40 MG PO TBEC
40.0000 mg | DELAYED_RELEASE_TABLET | Freq: Every day | ORAL | Status: DC
  Administered 2015-07-09 – 2015-07-11 (×3): 40 mg via ORAL
  Filled 2015-07-08 (×3): qty 1

## 2015-07-08 MED ORDER — ACETAMINOPHEN 10 MG/ML IV SOLN
INTRAVENOUS | Status: AC
  Filled 2015-07-08: qty 100

## 2015-07-08 MED ORDER — LACTATED RINGERS IV SOLN
INTRAVENOUS | Status: DC
  Administered 2015-07-08: 07:00:00 via INTRAVENOUS

## 2015-07-08 MED ORDER — FENTANYL CITRATE (PF) 100 MCG/2ML IJ SOLN
INTRAMUSCULAR | Status: AC
  Administered 2015-07-08: 50 ug via INTRAVENOUS
  Filled 2015-07-08: qty 2

## 2015-07-08 MED ORDER — KETOROLAC TROMETHAMINE 15 MG/ML IJ SOLN
INTRAMUSCULAR | Status: AC
Start: 2015-07-08 — End: 2015-07-09
  Filled 2015-07-08: qty 1

## 2015-07-08 MED ORDER — DULOXETINE HCL 60 MG PO CPEP
60.0000 mg | ORAL_CAPSULE | Freq: Every day | ORAL | Status: DC
  Administered 2015-07-09 – 2015-07-11 (×3): 60 mg via ORAL
  Filled 2015-07-08 (×3): qty 1

## 2015-07-08 MED ORDER — ISOSORBIDE MONONITRATE ER 60 MG PO TB24
120.0000 mg | ORAL_TABLET | Freq: Every day | ORAL | Status: DC
Start: 2015-07-09 — End: 2015-07-11
  Administered 2015-07-09 – 2015-07-11 (×3): 120 mg via ORAL
  Filled 2015-07-08 (×3): qty 2

## 2015-07-08 MED ORDER — CEFAZOLIN SODIUM-DEXTROSE 2-3 GM-% IV SOLR
2.0000 g | INTRAVENOUS | Status: AC
  Administered 2015-07-08: 2 g via INTRAVENOUS

## 2015-07-08 MED ORDER — CHLORHEXIDINE GLUCONATE 4 % EX LIQD: 60.0000 mL | Freq: Once | CUTANEOUS | Status: DC

## 2015-07-08 MED ORDER — FENTANYL CITRATE (PF) 100 MCG/2ML IJ SOLN
25.0000 ug | INTRAMUSCULAR | Status: DC | PRN
  Administered 2015-07-08 (×4): 50 ug via INTRAVENOUS

## 2015-07-08 MED ORDER — HYDROMORPHONE HCL 1 MG/ML IJ SOLN: 0.5000 mg | INTRAMUSCULAR | Status: DC | PRN

## 2015-07-08 MED ORDER — PRAVASTATIN SODIUM 40 MG PO TABS
40.0000 mg | ORAL_TABLET | Freq: Every day | ORAL | Status: DC
  Administered 2015-07-08 – 2015-07-11 (×4): 40 mg via ORAL
  Filled 2015-07-08 (×4): qty 1

## 2015-07-08 MED ORDER — KETOROLAC TROMETHAMINE 15 MG/ML IJ SOLN
7.5000 mg | Freq: Four times a day (QID) | INTRAMUSCULAR | Status: AC
  Administered 2015-07-08 – 2015-07-09 (×4): 7.5 mg via INTRAVENOUS
  Filled 2015-07-08 (×3): qty 1

## 2015-07-08 MED ORDER — TRANEXAMIC ACID 1000 MG/10ML IV SOLN
2000.0000 mg | INTRAVENOUS | Status: DC | PRN
  Administered 2015-07-08: 2000 mg via TOPICAL

## 2015-07-08 MED ORDER — POLYETHYLENE GLYCOL 3350 17 G PO PACK: 17.0000 g | PACK | Freq: Every day | ORAL | Status: DC | PRN

## 2015-07-08 MED ORDER — ONDANSETRON HCL 4 MG/2ML IJ SOLN: 4.0000 mg | Freq: Once | INTRAMUSCULAR | Status: DC | PRN

## 2015-07-08 MED ORDER — ALBUMIN HUMAN 5 % IV SOLN
INTRAVENOUS | Status: DC | PRN
  Administered 2015-07-08: 09:00:00 via INTRAVENOUS

## 2015-07-08 MED ORDER — METHOCARBAMOL 500 MG PO TABS
500.0000 mg | ORAL_TABLET | Freq: Three times a day (TID) | ORAL | Status: DC | PRN
  Administered 2015-07-09 – 2015-07-10 (×2): 500 mg via ORAL
  Filled 2015-07-08 (×2): qty 1

## 2015-07-08 MED ORDER — OXYCODONE HCL 5 MG PO TABS
5.0000 mg | ORAL_TABLET | ORAL | Status: DC | PRN
  Administered 2015-07-08 (×2): 5 mg via ORAL

## 2015-07-08 MED ORDER — CEFAZOLIN SODIUM-DEXTROSE 2-3 GM-% IV SOLR
INTRAVENOUS | Status: AC
Start: 2015-07-08 — End: 2015-07-08
  Filled 2015-07-08: qty 50

## 2015-07-08 MED ORDER — ONDANSETRON HCL 4 MG PO TABS: 4.0000 mg | ORAL_TABLET | Freq: Four times a day (QID) | ORAL | Status: DC | PRN

## 2015-07-08 MED ORDER — ACETAMINOPHEN 10 MG/ML IV SOLN
1000.0000 mg | Freq: Once | INTRAVENOUS | Status: AC
  Administered 2015-07-08: 1000 mg via INTRAVENOUS

## 2015-07-08 MED ORDER — BUPIVACAINE-EPINEPHRINE (PF) 0.25% -1:200000 IJ SOLN
INTRAMUSCULAR | Status: DC | PRN
  Administered 2015-07-08: 17 mL

## 2015-07-08 MED ORDER — TRANEXAMIC ACID 1000 MG/10ML IV SOLN
2000.0000 mg | INTRAVENOUS | Status: DC
  Filled 2015-07-08: qty 20

## 2015-07-08 MED ORDER — PHENOL 1.4 % MT LIQD: 1.0000 | OROMUCOSAL | Status: DC | PRN

## 2015-07-08 MED ORDER — CEFAZOLIN SODIUM-DEXTROSE 2-3 GM-% IV SOLR
2.0000 g | Freq: Four times a day (QID) | INTRAVENOUS | Status: AC
  Administered 2015-07-08 (×2): 2 g via INTRAVENOUS
  Filled 2015-07-08 (×2): qty 50

## 2015-07-08 MED ORDER — RIVAROXABAN 10 MG PO TABS
10.0000 mg | ORAL_TABLET | Freq: Every day | ORAL | Status: DC
  Administered 2015-07-09 – 2015-07-10 (×2): 10 mg via ORAL
  Filled 2015-07-08 (×2): qty 1

## 2015-07-08 MED ORDER — LACTATED RINGERS IV SOLN
INTRAVENOUS | Status: DC | PRN
  Administered 2015-07-08 (×2): via INTRAVENOUS

## 2015-07-08 MED ORDER — LACTATED RINGERS IV SOLN: INTRAVENOUS | Status: DC | PRN

## 2015-07-08 MED ORDER — NITROGLYCERIN 0.4 MG SL SUBL: 0.4000 mg | SUBLINGUAL_TABLET | SUBLINGUAL | Status: DC | PRN

## 2015-07-08 MED ORDER — SODIUM CHLORIDE 0.9 % IV SOLN
75.0000 mL/h | INTRAVENOUS | Status: DC
  Administered 2015-07-08 – 2015-07-09 (×2): 75 mL/h via INTRAVENOUS

## 2015-07-08 SURGICAL SUPPLY — 54 items
BLADE SAW SAG 73X25 THK (BLADE) ×1
BLADE SAW SGTL 73X25 THK (BLADE) ×1 IMPLANT
BRUSH FEMORAL CANAL (MISCELLANEOUS) IMPLANT
CAPT HIP TOTAL 2 ×1 IMPLANT
COVER SURGICAL LIGHT HANDLE (MISCELLANEOUS) ×2 IMPLANT
DRAPE INCISE IOBAN 66X45 STRL (DRAPES) IMPLANT
DRAPE ORTHO SPLIT 77X108 STRL (DRAPES) ×4
DRAPE SURG ORHT 6 SPLT 77X108 (DRAPES) ×2 IMPLANT
DRSG MEPILEX BORDER 4X12 (GAUZE/BANDAGES/DRESSINGS) ×2 IMPLANT
DURAPREP 26ML APPLICATOR (WOUND CARE) ×4 IMPLANT
ELECT BLADE 6.5 EXT (BLADE) IMPLANT
ELECT REM PT RETURN 9FT ADLT (ELECTROSURGICAL) ×2
ELECTRODE REM PT RTRN 9FT ADLT (ELECTROSURGICAL) ×1 IMPLANT
EVACUATOR 1/8 PVC DRAIN (DRAIN) IMPLANT
FACESHIELD WRAPAROUND (MASK) ×6 IMPLANT
FACESHIELD WRAPAROUND OR TEAM (MASK) ×3 IMPLANT
GLOVE BIOGEL PI IND STRL 8 (GLOVE) ×2 IMPLANT
GLOVE BIOGEL PI IND STRL 8.5 (GLOVE) ×1 IMPLANT
GLOVE BIOGEL PI INDICATOR 8 (GLOVE) ×2
GLOVE BIOGEL PI INDICATOR 8.5 (GLOVE) ×1
GLOVE ECLIPSE 8.0 STRL XLNG CF (GLOVE) ×4 IMPLANT
GLOVE SURG ORTHO 8.5 STRL (GLOVE) ×4 IMPLANT
GOWN STRL REUS W/ TWL LRG LVL3 (GOWN DISPOSABLE) ×2 IMPLANT
GOWN STRL REUS W/TWL 2XL LVL3 (GOWN DISPOSABLE) ×4 IMPLANT
GOWN STRL REUS W/TWL LRG LVL3 (GOWN DISPOSABLE) ×4
HANDPIECE INTERPULSE COAX TIP (DISPOSABLE)
IMMOBILIZER KNEE 20 (SOFTGOODS) IMPLANT
IMMOBILIZER KNEE 22 UNIV (SOFTGOODS) ×2 IMPLANT
KIT BASIN OR (CUSTOM PROCEDURE TRAY) ×2 IMPLANT
KIT ROOM TURNOVER OR (KITS) ×2 IMPLANT
MANIFOLD NEPTUNE II (INSTRUMENTS) ×2 IMPLANT
NEEDLE 22X1 1/2 (OR ONLY) (NEEDLE) ×2 IMPLANT
NS IRRIG 1000ML POUR BTL (IV SOLUTION) ×2 IMPLANT
PACK TOTAL JOINT (CUSTOM PROCEDURE TRAY) ×2 IMPLANT
PACK UNIVERSAL I (CUSTOM PROCEDURE TRAY) ×2 IMPLANT
PAD ARMBOARD 7.5X6 YLW CONV (MISCELLANEOUS) ×2 IMPLANT
PRESSURIZER FEMORAL UNIV (MISCELLANEOUS) IMPLANT
SET HNDPC FAN SPRY TIP SCT (DISPOSABLE) IMPLANT
STAPLER VISISTAT 35W (STAPLE) IMPLANT
SUCTION FRAZIER TIP 10 FR DISP (SUCTIONS) ×2 IMPLANT
SUT BONE WAX W31G (SUTURE) IMPLANT
SUT ETHIBOND NAB CT1 #1 30IN (SUTURE) ×6 IMPLANT
SUT MNCRL AB 3-0 PS2 18 (SUTURE) ×2 IMPLANT
SUT VIC AB 0 CT1 27 (SUTURE) ×4
SUT VIC AB 0 CT1 27XBRD ANBCTR (SUTURE) ×2 IMPLANT
SUT VIC AB 1 CT1 27 (SUTURE) ×4
SUT VIC AB 1 CT1 27XBRD ANBCTR (SUTURE) ×2 IMPLANT
SUT VIC AB 2-0 CT1 27 (SUTURE) ×2
SUT VIC AB 2-0 CT1 TAPERPNT 27 (SUTURE) ×1 IMPLANT
SYR CONTROL 10ML LL (SYRINGE) ×2 IMPLANT
TOWEL OR 17X24 6PK STRL BLUE (TOWEL DISPOSABLE) ×2 IMPLANT
TOWEL OR 17X26 10 PK STRL BLUE (TOWEL DISPOSABLE) ×2 IMPLANT
TOWER CARTRIDGE SMART MIX (DISPOSABLE) IMPLANT
WATER STERILE IRR 1000ML POUR (IV SOLUTION) ×8 IMPLANT

## 2015-07-08 NOTE — Anesthesia Procedure Notes (Signed)
Procedure Name: MAC Date/Time: 07/08/2015 7:34 AM Performed by: Jacquiline Doe A Pre-anesthesia Checklist: Patient identified, Timeout performed, Emergency Drugs available, Suction available and Patient being monitored Patient Re-evaluated:Patient Re-evaluated prior to inductionOxygen Delivery Method: Nasal cannula Intubation Type: IV induction Placement Confirmation: positive ETCO2 Dental Injury: Teeth and Oropharynx as per pre-operative assessment

## 2015-07-08 NOTE — Progress Notes (Signed)
Notified Dr. Durward Fortes about patient LOC change this morning. Will continue to monitor closely.

## 2015-07-08 NOTE — Progress Notes (Signed)
PT Cancellation Note  Patient Details Name: Carrie Gillespie MRN: [id number] DOB: Jan 07, 1920   Cancelled Treatment:    Reason Eval/Treat Not Completed: Pain limiting ability to participate;Fatigue/lethargy limiting ability to participate; patient recently to floor after surgery.  Family reports she is confused and pt c/o too much pain to participate in edge of bed activity at this time.  Will complete evaluation tomorrow.   Janice Seales,CYNDI 07/08/2015, 2:42 PM  [place], [place] 07/08/2015

## 2015-07-08 NOTE — Transfer of Care (Signed)
Immediate Anesthesia Transfer of Care Note  Patient: Carrie Gillespie  Procedure(s) Performed: Procedure(s): TOTAL HIP ARTHROPLASTY (Right)  Patient Location: PACU  Anesthesia Type:Spinal  Level of Consciousness: awake, alert , oriented, patient cooperative and responds to stimulation  Airway & Oxygen Therapy: Patient Spontanous Breathing  Post-op Assessment: Report given to RN and Post -op Vital signs reviewed and stable  Post vital signs: Reviewed and stable  Last Vitals:  Filed Vitals:   07/08/15 0700  BP: 149/62  Pulse: 56  Resp: 23    Complications: No apparent anesthesia complications

## 2015-07-08 NOTE — Anesthesia Preprocedure Evaluation (Signed)
Anesthesia Evaluation  Patient identified by MRN, date of birth, ID band Patient awake    Reviewed: Allergy & Precautions, NPO status , Patient's Chart, lab work & pertinent test results  Airway Mallampati: II  TM Distance: >3 FB     Dental  (+) Teeth Intact   Pulmonary    breath sounds clear to auscultation       Cardiovascular hypertension,  Rhythm:Regular Rate:Normal     Neuro/Psych    GI/Hepatic   Endo/Other    Renal/GU      Musculoskeletal   Abdominal   Peds  Hematology   Anesthesia Other Findings   Reproductive/Obstetrics                             Anesthesia Physical Anesthesia Plan  ASA: III  Anesthesia Plan: MAC and Spinal   Post-op Pain Management:    Induction: Intravenous  Airway Management Planned: Natural Airway and Simple Face Mask  Additional Equipment:   Intra-op Plan:   Post-operative Plan:   Informed Consent: I have reviewed the patients History and Physical, chart, labs and discussed the procedure including the risks, benefits and alternatives for the proposed anesthesia with the patient or authorized representative who has indicated his/her understanding and acceptance.     Plan Discussed with: CRNA and Anesthesiologist  Anesthesia Plan Comments:         Anesthesia Quick Evaluation

## 2015-07-08 NOTE — Progress Notes (Signed)
Called at 0546 per floor RN regarding Pt with ALOC. Anesthesiologist at bedside upon my arrival. Upon my arrival at 0551 Pt found resting in bed, alert oriented x4 denies pain or SOB. MAEW X4. VSS and charted per floor RN in epic flow sheet. HR 50s SB, EKG completed yielding SB.  Per Pt Daughter , Pt has history of seizures and she is concerned this may have been seizure activity. No tonic clonic jerking assessed per floor RN. Pt does not appear post ictal.  Dr. Durward Fortes paged per and awaiting a call back. Pt left resting, VSS. RN advised to monitor closely and notify RRT and Provider for worsening.

## 2015-07-08 NOTE — Progress Notes (Signed)
Rapid response nurse remains at bedside. 113/80,100%, 12,51

## 2015-07-08 NOTE — Op Note (Signed)
PATIENT ID:      Carrie Gillespie  MRN:     [id number] DOB/AGE:    09/29/1919 / 81 y.o.       OPERATIVE REPORT    DATE OF PROCEDURE:  07/08/2015       PREOPERATIVE DIAGNOSIS:END STAGE PRIMARY   OSTEOARTHRITIS OF RIGHT HIP                                                       Estimated body mass index is 32.09 kg/(m^2) as calculated from the following:   Height as of this encounter: 5\' 2"  (1.575 m).   Weight as of this encounter: 79.606 kg (175 lb 8 oz).     POSTOPERATIVE DIAGNOSIS:   OSTEOARTHRITIS OF RIGHT HIP-SAME                                                                     Estimated body mass index is 32.09 kg/(m^2) as calculated from the following:   Height as of this encounter: 5\' 2"  (1.575 m).   Weight as of this encounter: 79.606 kg (175 lb 8 oz).     PROCEDURE:  Procedure(s):RIGHT TOTAL HIP ARTHROPLASTY      SURGEON:  [name], MD    ASSISTANT:   Biagio Borg, PA-C   (Present and scrubbed throughout the case, critical for assistance with exposure, retraction, instrumentation, and closure.)          ANESTHESIA: spinal and IV sedation     DRAINS: none :      TOURNIQUET TIME: * No tourniquets in log *    COMPLICATIONS:  None   CONDITION:  stable  PROCEDURE IN DETAIL: [id number]   Carrie Gillespie W 07/08/2015, 9:24 AM

## 2015-07-08 NOTE — Discharge Instructions (Signed)
Information on my medicine - XARELTO (Rivaroxaban)  This medication education was reviewed with me or my healthcare representative as part of my discharge preparation.  The pharmacist that spoke with me during my hospital stay was:  Reginia Naas, [facility]  Why was Xarelto prescribed for you? Xarelto was prescribed for you to reduce the risk of blood clots forming after orthopedic surgery. The medical term for these abnormal blood clots is venous thromboembolism (VTE).  What do you need to know about xarelto ? Take your Xarelto ONCE DAILY at the same time every day. You may take it either with or without food.  If you have difficulty swallowing the tablet whole, you may crush it and mix in applesauce just prior to taking your dose.  Take Xarelto exactly as prescribed by your doctor and DO NOT stop taking Xarelto without talking to the doctor who prescribed the medication.  Stopping without other VTE prevention medication to take the place of Xarelto may increase your risk of developing a clot.  After discharge, you should have regular check-up appointments with your healthcare provider that is prescribing your Xarelto.    What do you do if you miss a dose? If you miss a dose, take it as soon as you remember on the same day then continue your regularly scheduled once daily regimen the next day. Do not take two doses of Xarelto on the same day.   Important Safety Information A possible side effect of Xarelto is bleeding. You should call your healthcare provider right away if you experience any of the following: ? Bleeding from an injury or your nose that does not stop. ? Unusual colored urine (red or dark brown) or unusual colored stools (red or black). ? Unusual bruising for unknown reasons. ? A serious fall or if you hit your head (even if there is no bleeding).  Some medicines may interact with Xarelto and might increase your risk of bleeding while on Xarelto. To help avoid  this, consult your healthcare provider or pharmacist prior to using any new prescription or non-prescription medications, including herbals, vitamins, non-steroidal anti-inflammatory drugs (NSAIDs) and supplements.  This website has more information on Xarelto: [email or web address]

## 2015-07-08 NOTE — Anesthesia Postprocedure Evaluation (Signed)
  Anesthesia Post-op Note  Patient: Carrie Gillespie  Procedure(s) Performed: Procedure(s): TOTAL HIP ARTHROPLASTY (Right)  Patient Location: PACU  Anesthesia Type:Spinal  Level of Consciousness: awake, alert  and oriented  Airway and Oxygen Therapy: Patient Spontanous Breathing and Patient connected to nasal cannula oxygen  Post-op Pain: mild  Post-op Assessment: Post-op Vital signs reviewed, Patient's Cardiovascular Status Stable, Respiratory Function Stable, Patent Airway and Pain level controlled LLE Motor Response: Responds to commands, Purposeful movement LLE Sensation: Numbness RLE Motor Response: Responds to commands, Purposeful movement RLE Sensation: Numbness L Sensory Level: S2-Posterior medial thigh and lower leg R Sensory Level: S2-Posterior medial thigh and lower leg  Post-op Vital Signs: stable  Last Vitals:  Filed Vitals:   07/08/15 1300  BP: 163/55  Pulse: 57  Temp: 36.2 C  Resp: 16    Complications: No apparent anesthesia complications

## 2015-07-08 NOTE — Progress Notes (Signed)
Patient ID: [name], female   DOB: 05-Sep-1920, 81 y.o.   MRN: [id number] The recent History & Physical has been reviewed. I have personally examined the patient today. There is no interval change to the documented History & Physical. The patient would like to proceed with the procedure.  [place], PETER W 07/08/2015,  7:17 AM

## 2015-07-08 NOTE — Progress Notes (Signed)
Pt daughter called out that her mother was not waking up. Daughter noted standing in front of her mother rubbing her face. Pt non verbal. Arms fall at her side when lifted up, her head resting on her chest. Assisted to the stretcher, placed on monitor o2 applied. Skin pale and cool to touch.vs taken 129/56-55-99% on 2 liters-8 cbg 109 pt moaned when assisted to the stretcher. Rapid response and Dr Tobias Alexander called.

## 2015-07-08 NOTE — Progress Notes (Signed)
Utilization review completed.  

## 2015-07-09 ENCOUNTER — Encounter (HOSPITAL_COMMUNITY): Payer: Self-pay | Admitting: Orthopaedic Surgery

## 2015-07-09 LAB — CBC
HEMATOCRIT: 27.5 % — AB (ref 36.0–46.0)
HEMOGLOBIN: 9.2 g/dL — AB (ref 12.0–15.0)
MCH: 30.1 pg (ref 26.0–34.0)
MCHC: 33.5 g/dL (ref 30.0–36.0)
MCV: 89.9 fL (ref 78.0–100.0)
PLATELETS: 160 10*3/uL (ref 150–400)
RBC: 3.06 MIL/uL — ABNORMAL LOW (ref 3.87–5.11)
RDW: 13.9 % (ref 11.5–15.5)
WBC: 8.3 10*3/uL (ref 4.0–10.5)

## 2015-07-09 LAB — BASIC METABOLIC PANEL
ANION GAP: 11 (ref 5–15)
BUN: 14 mg/dL (ref 6–20)
CALCIUM: 8.7 mg/dL — AB (ref 8.9–10.3)
CO2: 26 mmol/L (ref 22–32)
Chloride: 93 mmol/L — ABNORMAL LOW (ref 101–111)
Creatinine, Ser: 0.73 mg/dL (ref 0.44–1.00)
GLUCOSE: 126 mg/dL — AB (ref 65–99)
Potassium: 4.5 mmol/L (ref 3.5–5.1)
SODIUM: 130 mmol/L — AB (ref 135–145)

## 2015-07-09 NOTE — Evaluation (Signed)
Physical Therapy Evaluation Patient Details Name: Carrie Gillespie MRN: [id number] DOB: [birth year] Today's Date: 07/09/2015   History of Present Illness  Pt is a 81 y/o F s/p Rt THA.  Pt's PMH includes Lt TKA, Lt THA, lumbar spine surgery, depression, seizures, peripheal neuropathy, anxiety.  Clinical Impression  Pt is s/p Rt THA resulting in the deficits listed below (see PT Problem List). Carrie Gillespie lives alone and will need to go to SNF prior to home.  She requires mod assist for bed mobility and min assist ambulating. Pt will benefit from skilled PT to increase their independence and safety with mobility to allow discharge to the venue listed below.     Follow Up Recommendations SNF;Supervision/Assistance - 24 hour    Equipment Recommendations  Other (comment) (TBD at next venue of care)    Recommendations for Other Services       Precautions / Restrictions Precautions Precautions: Posterior Hip;Fall Precaution Booklet Issued: Yes (comment) Precaution Comments: Reviewed all hip precautions Required Braces or Orthoses: Knee Immobilizer - Right Knee Immobilizer - Right: Other (comment) (not specified in order) Restrictions Weight Bearing Restrictions: Yes RLE Weight Bearing: Weight bearing as tolerated      Mobility  Bed Mobility Overal bed mobility: Needs Assistance Bed Mobility: Supine to Sit     Supine to sit: Mod assist;HOB elevated     General bed mobility comments: Mod assist managing Bil LEs and using bed pad to scoot pt's pelvis to EOB.  Pt uses bed rails and 1 person HHA to pull up to sitting.  Transfers Overall transfer level: Needs assistance Equipment used: Rolling walker (2 wheeled) Transfers: Sit to/from Stand Sit to Stand: Min assist;From elevated surface         General transfer comment: Min assist to power up to standing and cues provided for technique and hand placement.  Ambulation/Gait Ambulation/Gait assistance: Min assist Ambulation Distance  (Feet): 50 Feet Assistive device: Rolling walker (2 wheeled) Gait Pattern/deviations: Step-to pattern;Antalgic;Decreased weight shift to right;Decreased stride length;Decreased stance time - right;Trunk flexed   Gait velocity interpretation: <1.8 ft/sec, indicative of risk for recurrent falls General Gait Details: Cues to stand upright and look ahead.  Trunk flexed.  Fatigues quickly.  Stairs            Wheelchair Mobility    Modified Rankin (Stroke Patients Only)       Balance Overall balance assessment: Needs assistance;History of Falls Sitting-balance support: Bilateral upper extremity supported;Feet supported Sitting balance-Leahy Scale: Fair     Standing balance support: Bilateral upper extremity supported;During functional activity Standing balance-Leahy Scale: Poor Standing balance comment: Requires RW for support                             Pertinent Vitals/Pain Pain Assessment: 0-10 Pain Score: 10-Worst pain ever Pain Location: Rt hip w/ mobility Pain Descriptors / Indicators: Constant;Aching;Grimacing;Guarding Pain Intervention(s): Limited activity within patient's tolerance;Monitored during session;Repositioned    Home Living Family/patient expects to be discharged to:: Skilled nursing facility Living Arrangements: Alone                    Prior Function Level of Independence: Independent with assistive device(s)         Comments: PTA using RW at all times as she has fallen "quite a few times" in the past 6 months     Hand Dominance        Extremity/Trunk Assessment   Upper  Extremity Assessment: Generalized weakness           Lower Extremity Assessment: RLE deficits/detail RLE Deficits / Details: weakness and limited ROM as expected s/p Rt THA       Communication   Communication: HOH  Cognition Arousal/Alertness: Awake/alert Behavior During Therapy: Anxious Overall Cognitive Status: Within Functional Limits for  tasks assessed                      General Comments      Exercises Total Joint Exercises Ankle Circles/Pumps: AROM;Both;10 reps;Supine Quad Sets: Strengthening;Both;10 reps;Supine Gluteal Sets: Both;Strengthening;10 reps;Supine      Assessment/Plan    PT Assessment Patient needs continued PT services  PT Diagnosis Difficulty walking;Abnormality of gait;Generalized weakness;Acute pain   PT Problem List Decreased strength;Decreased range of motion;Decreased activity tolerance;Decreased balance;Decreased mobility;Decreased knowledge of use of DME;Decreased safety awareness;Decreased knowledge of precautions;Impaired sensation;Decreased skin integrity;Pain  PT Treatment Interventions DME instruction;Gait training;Therapeutic activities;Functional mobility training;Therapeutic exercise;Balance training;Neuromuscular re-education;Patient/family education;Modalities   PT Goals (Current goals can be found in the Care Plan section) Acute Rehab PT Goals Patient Stated Goal: to go to rehab then home PT Goal Formulation: With patient/family Time For Goal Achievement: 07/16/15 Potential to Achieve Goals: Good    Frequency 7X/week   Barriers to discharge Decreased caregiver support Pt lives alone     Co-evaluation               End of Session Equipment Utilized During Treatment: Gait belt;Right knee immobilizer Activity Tolerance: Patient limited by fatigue;Patient limited by pain Patient left: in chair;with call bell/phone within reach;with family/visitor present;with nursing/sitter in room Nurse Communication: Mobility status;Precautions;Weight bearing status         Time: 1115-1140 PT Time Calculation (min) (ACUTE ONLY): 25 min   Charges:   PT Evaluation $Initial PT Evaluation Tier I: 1 Procedure PT Treatments $Gait Training: 8-22 mins   PT G CodesJoslyn Gillespie PT, DPT [phone] Pager: [phone] 07/09/2015, 1:53 PM

## 2015-07-09 NOTE — Op Note (Signed)
NAMEOMARI, KOSLOSKY NO.:  [id number]  MEDICAL RECORD NO.:  [id number]  LOCATION:  5N12C                        FACILITY:  [facility]  PHYSICIAN:  Vonna Kotyk. Jerian Morais, M.D.DATE OF BIRTH:  January 13, 1920  DATE OF PROCEDURE:  07/08/2015 DATE OF DISCHARGE:                              OPERATIVE REPORT   PREOPERATIVE DIAGNOSIS:  Primary, end-stage osteoarthritis, right hip.  POSTOPERATIVE DIAGNOSIS:  Primary, end-stage osteoarthritis, right hip.  PROCEDURE:  Right total hip replacement.  SURGEON:  Vonna Kotyk. Durward Fortes, M.D.  ASSISTANT:  Aaron Edelman D. Petrarca, PA-C, who was present throughout the operative procedure to ensure its timely completion.  ANESTHESIA:  Spinal with IV sedation.  COMPLICATIONS:  None.  COMPONENTS:  DePuy AML 12 mm small stature femoral stem, the 32 mm outer diameter hip ball with a +1 mm neck length, 50 mm outer diameter, Sector 3 Gription metallic acetabular component with apex hole eliminator, a single 30 mm 6.5 mm titanium acetabular screw, and a Marathon polyethylene liner +4 with a 10-degree posterior lip.  Components were press-fit.  DESCRIPTION OF PROCEDURE:  Mrs. Tilmon was met with the family in the holding area.  I identified the right hip and marked it accordingly. Mrs. Drumwright had what appeared to be a vasovagal episode in short stay. She was evaluated with an EKG and lab, all of which were normal and within about 3 to 4 minutes, she returned to a normal state.  She does have bradycardia which is chronic.  It was felt that this may be related to her not eating and taking certain medicines that may low her blood pressure.  Dr. Linna Caprice and one of the other anesthesiologists evaluated her and we felt that she was stable to proceed with surgery.  We discussed this with Mrs. Rezek and family, they were all comfortable with that.  I marked her right hip as appropriate operative site and she was transported to room #7.  Spinal anesthesia was  performed by Anesthesia. Mrs. Molzahn was placed in the lateral decubitus position with the right side up and secured to the operating table with the Innomed hip system. Prior to turning, we did insert a Foley catheter, urine appeared to be clear.  The right hip was then prepped with chlorhexidine scrub and DuraPrep x2 from the iliac crest to the midcalf.  Sterile draping was performed.  Time-out was called.  A routine southern incision was utilized and via sharp dissection, carried down to subcutaneous tissue.  Gross bleeders were Bovie coagulated.  There was abundant adipose tissue that was incised.  The iliotibial band was identified, incised along the length of the skin incision.  Self-retaining retractors were inserted.  With the hip internally rotated, the short external rotators were identified.  These were released sharply from the attachment to the posterior greater trochanter.  Tendinous structures were tagged with 0 Ethibond suture.  The capsule was identified, incised along the femoral neck and head, and the hip then easily dislocated posteriorly.  There was a small hip effusion.  The femoral neck was cleared of any soft tissue.  It was then osteotomized.  With the calcar angle and the oscillating saw, the head was delivered.  There was about 80% to 90% loss of articular cartilage with an eburnated bone and subchondral cyst.  Starter hole was then made in the piriformis fossa.  Hand reaming into the femoral canal and then power reaming to 11.5 mm to accept a 12 mm component.  I did short the neck slightly as it was about a fingerbreadth and a half proximal to the lesser trochanter using the rasp as a guide and the oscillating saw and then re-reamed and re-rasped until we had excellent position and fit.  Retractors were then placed about the acetabulum.  She had a large redundant labrum which was excised.  Reaming was performed to 49 mm to accept a 50 mm component.  A 48  mm component had nice rim fit, but would completely seat the 59, but not completely seat, but had good rim fit. Accordingly, we inserted 50 mm outer diameter Gription  Sector 3 metallic acetabular component.  We had a very nice fit.  We inserted the trial Marathon, the trial polyethylene screw and component.  We inserted a 12 mm femoral rasp and then we trialed the 32 mm outer diameter head and with a +1 neck length.  This was reduced through a full range of motion.  Thought we had re-established leg lengths as she was slightly shorter preoperatively.  She had full range of motion, no instability.  The trial components were removed.  We irrigated the acetabulum throughout the procedure.  I did insert a single 30 mm long, 6.5 mm wide titanium screw after appropriate reaming and measuring for further stability of the acetabulum.  I then inserted the apex hole eliminator and the Marathon polyethylene liner +4 with a 10-degree posterior lip. Wound was again irrigated with saline solution.  Retractors were then placed about the femoral neck.  The 12 mm small stature femoral component was then impacted onto the calcar.  We checked to be sure that there were no fractures.  We cleaned the [facility] taper neck and applied the 32 mm hip ball with a +1 neck length.  I cleaned the acetabulum of any debris and then reduced the entire construct and again through full range of motion, we had perfect stability.  I did check the sciatic nerve throughout the procedure, thought it was well protected, not of harm's way.  The capsule was then closed anatomically with a running 0 Ethibond, injected the deep capsule with 0.25% Marcaine without epinephrine. Short external rotator tendons were then sutured with the same material. Wound was again irrigated with saline solution.  We did use the tranexamic acid topically.  The IT band was closed with a running #1 Vicryl, subcu with Vicryl and 3-0 Monocryl, skin  closed with skin clips.  Sterile bulky dressing was applied.  The patient was then placed supine, placed on the operating room stretcher, and returned to the postanesthesia recovery room in satisfactory condition.     Vonna Kotyk. Durward Fortes, M.D.     PWW/MEDQ  D:  07/08/2015  T:  07/09/2015  Job:  [id number]

## 2015-07-09 NOTE — Progress Notes (Signed)
Patient ID: Carrie Gillespie, female   DOB: 21-Aug-1920, 81 y.o.   MRN: [id number] PATIENT ID: Carrie Gillespie        MRN:  [id number]          DOB/AGE: 1920-08-26 / 81 y.o.    Carrie Fears, MD   Biagio Borg, PA-C [address] [place], [place]  [address]                             [phone]   PROGRESS NOTE  Subjective:  negative for Chest Pain  negative for Shortness of Breath  negative for Nausea/Vomiting   negative for Calf Pain    Tolerating Diet: yes         Patient reports pain as mild.     Comfortable night  Objective: Vital signs in last 24 hours:   Patient Vitals for the past 24 hrs:  BP Temp Temp src Pulse Resp SpO2  07/09/15 0514 (!) 163/50 mmHg 100.1 F (37.8 C) Oral 79 16 100 %  07/08/15 2358 (!) 146/63 mmHg 99.6 F (37.6 C) Oral 76 16 99 %  07/08/15 2039 (!) 145/60 mmHg 98.5 F (36.9 C) Oral 67 16 100 %  07/08/15 1500 (!) 155/77 mmHg 98.8 F (37.1 C) Oral 68 16 99 %  07/08/15 1400 (!) 148/62 mmHg 97.9 F (36.6 C) Oral 65 16 100 %  07/08/15 1300 (!) 163/55 mmHg 97.1 F (36.2 C) Oral (!) 57 16 100 %  07/08/15 1215 (!) 143/60 mmHg 97.6 F (36.4 C) - 66 15 99 %  07/08/15 1200 (!) 143/62 mmHg - - 63 15 100 %  07/08/15 1145 (!) 109/44 mmHg - - 63 (!) 21 100 %  07/08/15 1130 138/60 mmHg - - (!) 59 13 99 %  07/08/15 1118 (!) 155/69 mmHg - - 61 15 100 %  07/08/15 1115 - - - (!) 58 15 100 %  07/08/15 1109 135/67 mmHg - - 61 (!) 9 100 %  07/08/15 1104 (!) 132/51 mmHg - - (!) 59 18 98 %  07/08/15 1100 135/67 mmHg - - 67 12 100 %  07/08/15 1049 (!) 143/58 mmHg - - 63 (!) 9 90 %  07/08/15 1045 - - - 64 11 (!) 89 %  07/08/15 1033 (!) 141/84 mmHg - - 60 10 97 %  07/08/15 1030 - - - 63 13 98 %  07/08/15 1015 - - - (!) 57 12 99 %  07/08/15 1000 - - - 60 12 97 %  07/08/15 0949 (!) 123/56 mmHg 97.8 F (36.6 C) - 60 15 96 %      Intake/Output from previous day:   10/11 0701 - 10/12 0700 In: 2770 [P.O.:120; I.V.:2400] Out: 1625 [id number]   Intake/Output  this shift:       Intake/Output      10/11 0701 - 10/12 0700 10/12 0701 - 10/13 0700   P.O. 120    I.V. (mL/kg) 2400 (30.1)    IV Piggyback 250    Total Intake(mL/kg) 2770 (34.8)    Urine (mL/kg/hr) 1175 (0.6)    Blood 450 (0.2)    Total Output 1625     Net +1145             LABORATORY DATA: No results for input(s): WBC, HGB, HCT, PLT in the last 168 hours. No results for input(s): NA, K, CL, CO2, BUN, CREATININE, GLUCOSE, CALCIUM in the last 168 hours. Lab Results  Component Value Date   INR 1.00 06/27/2015   INR 1.00 12/18/2014    Recent Radiographic Studies :  Dg Hip Port Unilat With Pelvis 1v Right  07/08/2015  CLINICAL DATA:  Right hip pain. EXAM: DG HIP (WITH OR WITHOUT PELVIS) 1V PORT RIGHT COMPARISON:  None. FINDINGS: Surgical staples and air noted over the right hip. Bilateral hip replacements. Normal alignment. Hardware intact. No acute bony abnormality identified. Normal alignment. Surgical staples noted over the right hip. IMPRESSION: Right hip replacement.  Good anatomic alignment. Electronically Signed   By: Marcello Moores  Register   On: 07/08/2015 11:12     Examination:  General appearance: alert, cooperative and no distress  Wound Exam: clean, dry, intact   Drainage:  None: wound tissue dry  Motor Exam: EHL, FHL, Anterior Tibial and Posterior Tibial Intact  Sensory Exam: Superficial Peroneal, Deep Peroneal and Tibial normal  Vascular Exam: Normal  Assessment:    1 Day Post-Op  Procedure(s) (LRB): TOTAL HIP ARTHROPLASTY (Right)  ADDITIONAL DIAGNOSIS:  Principal Problem:   Primary osteoarthritis of right hip Active Problems:   S/P total hip arthroplasty  no new problems   Plan: Physical Therapy as ordered Weight Bearing as Tolerated (WBAT)  DVT Prophylaxis:  Xarelto, Foot Pumps and TED hose  DISCHARGE PLAN: Skilled Nursing Facility[facility]  DISCHARGE NEEDS: HHPT, Walker and 3-in-1 comode seat   OOB with PT, KVO IV, saline lock IV, VS  stable,     Ryn Peine W  07/09/2015 7:32 AM

## 2015-07-10 LAB — BASIC METABOLIC PANEL
ANION GAP: 8 (ref 5–15)
ANION GAP: 9 (ref 5–15)
ANION GAP: 10 (ref 5–15)
BUN: 11 mg/dL (ref 6–20)
BUN: 11 mg/dL (ref 6–20)
BUN: 14 mg/dL (ref 6–20)
CALCIUM: 9 mg/dL (ref 8.9–10.3)
CALCIUM: 8.3 mg/dL — AB (ref 8.9–10.3)
CO2: 28 mmol/L (ref 22–32)
CO2: 27 mmol/L (ref 22–32)
CO2: 26 mmol/L (ref 22–32)
CREATININE: 0.72 mg/dL (ref 0.44–1.00)
CREATININE: 0.92 mg/dL (ref 0.44–1.00)
Calcium: 8.4 mg/dL — ABNORMAL LOW (ref 8.9–10.3)
Chloride: 89 mmol/L — ABNORMAL LOW (ref 101–111)
Chloride: 92 mmol/L — ABNORMAL LOW (ref 101–111)
Chloride: 87 mmol/L — ABNORMAL LOW (ref 101–111)
Creatinine, Ser: 0.75 mg/dL (ref 0.44–1.00)
GFR, EST NON AFRICAN AMERICAN: 52 mL/min — AB (ref >60)
GLUCOSE: 130 mg/dL — AB (ref 65–99)
Glucose, Bld: 130 mg/dL — ABNORMAL HIGH (ref 65–99)
Glucose, Bld: 141 mg/dL — ABNORMAL HIGH (ref 65–99)
POTASSIUM: 4.2 mmol/L (ref 3.5–5.1)
Potassium: 4.5 mmol/L (ref 3.5–5.1)
Potassium: 4.3 mmol/L (ref 3.5–5.1)
SODIUM: 128 mmol/L — AB (ref 135–145)
SODIUM: 123 mmol/L — AB (ref 135–145)
Sodium: 125 mmol/L — ABNORMAL LOW (ref 135–145)

## 2015-07-10 LAB — CBC
HCT: 25.6 % — ABNORMAL LOW (ref 36.0–46.0)
Hemoglobin: 8.5 g/dL — ABNORMAL LOW (ref 12.0–15.0)
MCH: 29.6 pg (ref 26.0–34.0)
MCHC: 33.2 g/dL (ref 30.0–36.0)
MCV: 89.2 fL (ref 78.0–100.0)
PLATELETS: 173 10*3/uL (ref 150–400)
RBC: 2.87 MIL/uL — AB (ref 3.87–5.11)
RDW: 13.8 % (ref 11.5–15.5)
WBC: 9.8 10*3/uL (ref 4.0–10.5)

## 2015-07-10 MED ORDER — HYDROCODONE-ACETAMINOPHEN 7.5-325 MG PO TABS: 1.0000 | ORAL_TABLET | ORAL | Status: DC | PRN

## 2015-07-10 MED ORDER — METHOCARBAMOL 500 MG PO TABS: 500.0000 mg | ORAL_TABLET | Freq: Three times a day (TID) | ORAL | Status: DC | PRN

## 2015-07-10 MED ORDER — ASPIRIN 325 MG PO TBEC: 325.0000 mg | DELAYED_RELEASE_TABLET | Freq: Every day | ORAL | Status: DC

## 2015-07-10 MED ORDER — ASPIRIN EC 325 MG PO TBEC
325.0000 mg | DELAYED_RELEASE_TABLET | Freq: Every day | ORAL | Status: DC
  Administered 2015-07-10 – 2015-07-11 (×2): 325 mg via ORAL
  Filled 2015-07-10 (×2): qty 1

## 2015-07-10 NOTE — Discharge Summary (Signed)
Carrie Fears, MD   Carrie Borg, PA-C [address] [place], [place]  [address]                             [phone]  PATIENT ID: Carrie Gillespie        MRN:  [id number]          DOB/AGE: 1920/07/19 / 81 y.o.    DISCHARGE SUMMARY  ADMISSION DATE:    07/08/2015 DISCHARGE DATE:   07/11/2015   ADMISSION DIAGNOSIS: OSTEOARTHRITIS OF RIGHT HIP    DISCHARGE DIAGNOSIS:  OSTEOARTHRITIS OF RIGHT HIP    ADDITIONAL DIAGNOSIS: Principal Problem:   Primary osteoarthritis of right hip Active Problems:   S/P total hip arthroplasty ABLA well compensated  Past Medical History  Diagnosis Date  . Other and unspecified hyperlipidemia     takes Pravastatin daily  . Coronary atherosclerosis of native coronary artery     Status post stent to right coronary 2004, catheterization 2005 nonobstructive CAD, normal LV function, Cardiolite study 2008 negative for ischemia. Residual moderate ostial RCA disease. Stress testing 2012 negative for ischemia ejection fraction 78%  . Nerve entrapment syndrome of lower extremity      status post prior back surgery and weakness in left leg  . Arthritis   . Unspecified essential hypertension     takes Lisinopril,Metoprolol,and Imdur daily  . Depression     takes Cymbalta daily  . GERD (gastroesophageal reflux disease)     takes Omeprazole daily  . History of bronchitis     > 20yrs ago   . Seizures ([place])     takes Tegretol daily;has only had 1 seizure and that was several yrs ago.  . Peripheral neuropathy ([place])   . Joint pain   . Diverticulosis   . History of blood transfusion     no abnormal reaction noted  . Macular degeneration     dry  . Peripheral vascular disease, unspecified ([place])     Minimal internal carotid artery disease  . Carotid artery disease ([place])     with an occluded right vertebral artery, otherwise nonobstructive carotid artery disease  . Anxiety   . Cough     PROCEDURE: Procedure(s): TOTAL HIP ARTHROPLASTY Right on  07/08/2015  CONSULTS: none     HISTORY: Carrie Gillespie is a very pleasant 81 year old white female who is seen today for evaluation of her right hip. She is status post left total hip arthroplasty with a very good result. However, she had noted recently, in August, worsening pain and problems with the right lower extremity with her right knee and right thigh. X-rays had been obtained of the pelvis, which revealed significantdegenerative changes and AVN of the right hip. She has subchondral sclerosis and complete collapse of the joint space. She did have some degenerative changes of her right knee. She continued to have pain in the thigh and knee area, which has been worsening. It has now gotten to the point where she is having pain with every step, has to use a walker and is noting to fall at times because of this discomfort and instability of her right hip. She is now to the point where she has pain with every step, nighttime pain, pain with activities of daily living. She is unable to find a comfortable position in the bed or with sitting.  HOSPITAL COURSE:  Carrie Gillespie is a 81 y.o. admitted on 07/08/2015 and found to have a  diagnosis of OSTEOARTHRITIS OF RIGHT HIP.  After appropriate laboratory studies were obtained  they were taken to the operating room on 07/08/2015 and underwent  Procedure(s): TOTAL HIP ARTHROPLASTY  Right.   They were given perioperative antibiotics:  Anti-infectives    Start     Dose/Rate Route Frequency Ordered Stop   07/08/15 1330  ceFAZolin (ANCEF) IVPB 2 g/50 mL premix     2 g 100 mL/hr over 30 Minutes Intravenous Every 6 hours 07/08/15 1256 07/08/15 2026   07/08/15 0700  ceFAZolin (ANCEF) IVPB 2 g/50 mL premix     2 g 100 mL/hr over 30 Minutes Intravenous On call to O.R. 07/08/15 0629 07/08/15 0805   07/08/15 0518  ceFAZolin (ANCEF) 2-3 GM-% IVPB SOLR    Comments:  Scronce, Trina   : cabinet override      07/08/15 0518 07/08/15 1729    .  Tolerated the  procedure well.  Placed with a foley intraoperatively.    Toradol was given post op.  POD #1, allowed out of bed to a chair.  PT for ambulation and exercise program.  Foley D/C'd in morning.  IV saline locked.  O2 discontionued.  POD #2, continued PT and ambulation.  Hyponatremia but not much different.  Did fluid restrictions.  POD #3, continued PT and required stay for Medicare.  Sitting up in chair wanting to go to SNF  The remainder of the hospital course was dedicated to ambulation and strengthening.   The patient was discharged on 3 Days Post-Op in  Stable condition.  Blood products given:none  DIAGNOSTIC STUDIES: Recent vital signs:  Patient Vitals for the past 24 hrs:  BP Temp Temp src Pulse Resp SpO2  07/11/15 0556 (!) 149/64 mmHg 97.6 F (36.4 C) - 80 18 100 %  07/10/15 2031 (!) 146/51 mmHg 99.1 F (37.3 C) - 84 16 98 %  07/10/15 1430 140/60 mmHg 98.3 F (36.8 C) Oral 88 18 -  07/10/15 1055 (!) 146/91 mmHg - - - - -       Recent laboratory studies:  Recent Labs  07/09/15 0626 07/10/15 0512 07/11/15 0417  WBC 8.3 9.8 9.3  HGB 9.2* 8.5* 7.7*  HCT 27.5* 25.6* 22.8*  PLT 160 173 163    Recent Labs  07/09/15 0626 07/10/15 0512 07/10/15 0935 07/10/15 2027 07/11/15 0417  NA 130* 125* 128* 123* 126*  K 4.5 4.2 4.5 4.3 4.1  CL 93* 89* 92* 87* 90*  CO2 26 28 27 26 27   BUN 14 11 11 14 14   CREATININE 0.73 0.75 0.72 0.92 0.80  GLUCOSE 126* 130* 130* 141* 109*  CALCIUM 8.7* 8.4* 9.0 8.3* 8.5*   Lab Results  Component Value Date   INR 1.00 06/27/2015   INR 1.00 12/18/2014     Recent Radiographic Studies :  Dg Hip Port Unilat With Pelvis 1v Right  07/08/2015  CLINICAL DATA:  Right hip pain. EXAM: DG HIP (WITH OR WITHOUT PELVIS) 1V PORT RIGHT COMPARISON:  None. FINDINGS: Surgical staples and air noted over the right hip. Bilateral hip replacements. Normal alignment. Hardware intact. No acute bony abnormality identified. Normal alignment. Surgical staples noted  over the right hip. IMPRESSION: Right hip replacement.  Good anatomic alignment. Electronically Signed   By: Marcello Moores  Register   On: 07/08/2015 11:12    DISCHARGE INSTRUCTIONS:     Discharge Instructions    Call MD / Call 911    Complete by:  As directed   If you experience chest  pain or shortness of breath, CALL 911 and be transported to the hospital emergency room.  If you develope a fever above 101 F, pus (white drainage) or increased drainage or redness at the wound, or calf pain, call your surgeon's office.     Change dressing    Complete by:  As directed   DO NOT CHANGE DRESSING. KEEP ON TILL SEEN IN THE OFFICE     Constipation Prevention    Complete by:  As directed   Drink plenty of fluids.  Prune juice may be helpful.  You may use a stool softener, such as Colace (over the counter) 100 mg twice a day.  Use MiraLax (over the counter) for constipation as needed.     Diet general    Complete by:  As directed      Discharge instructions    Complete by:  As directed   [place] items at home which could result in a fall. This includes throw rugs or furniture in walking pathways ICE to the affected joint every three hours while awake for 30 minutes at a time, for at least the first 3-5 days, and then as needed for pain and swelling.  Continue to use ice for pain and swelling. You may notice swelling that will progress down to the foot and ankle.  This is normal after surgery.  Elevate your leg when you are not up walking on it.   Continue to use the breathing machine you got in the hospital (incentive spirometer) which will help keep your temperature down.  It is common for your temperature to cycle up and down following surgery, especially at night when you are not up moving around and exerting yourself.  The breathing machine keeps your lungs expanded and your temperature down.   DIET:  As you were doing prior to hospitalization, we recommend a  well-balanced diet.  DRESSING / WOUND CARE / SHOWERING  Keep the surgical dressing until follow up.  The dressing is water proof, so you can shower without any extra covering.  IF THE DRESSING FALLS OFF or the wound gets wet inside, change the dressing with sterile gauze.  Please use good hand washing techniques before changing the dressing.  Do not use any lotions or creams on the incision until instructed by your surgeon.    ACTIVITY  Increase activity slowly as tolerated, but follow the weight bearing instructions below.   No driving for 6 weeks or until further direction given by your physician.  You cannot drive while taking narcotics.  No lifting or carrying greater than 10 lbs. until further directed by your surgeon. Avoid periods of inactivity such as sitting longer than an hour when not asleep. This helps prevent blood clots.  You may return to work once you are authorized by your doctor.     WEIGHT BEARING   Weight bearing as tolerated with assist device (walker, cane, etc) as directed, use it as long as suggested by your surgeon or therapist, typically at least 4-6 weeks.   EXERCISES  Results after joint replacement surgery are often greatly improved when you follow the exercise, range of motion and muscle strengthening exercises prescribed by your doctor. Safety measures are also important to protect the joint from further injury. Any time any of these exercises cause you to have increased pain or swelling, decrease what you are doing until you are comfortable again and then slowly increase them. If you have problems or questions,  call your caregiver or physical therapist for advice.   Rehabilitation is important following a joint replacement. After just a few days of immobilization, the muscles of the leg can become weakened and shrink (atrophy).  These exercises are designed to build up the tone and strength of the thigh and leg muscles and to improve motion. Often times heat  used for twenty to thirty minutes before working out will loosen up your tissues and help with improving the range of motion but do not use heat for the first two weeks following surgery (sometimes heat can increase post-operative swelling).   These exercises can be done on a bed. Use whatever works the best and is most comfortable for you.    Use music or television while you are exercising so that the exercises are a pleasant break in your day. This will make your life better with the exercises acting as a break in your routine that you can look forward to.   Perform all exercises about fifteen times, three times per day or as directed.  You should exercise both the operative leg and the other leg as well.   Exercises include:  Quad Sets - Tighten up the muscle on the front of the thigh (Quad) and hold for 5-10 seconds.   Straight Leg Raises - With your knee straight (if you were given a brace, keep it on), lift the leg to 60 degrees, hold for 3 seconds, and slowly lower the leg.  Perform this exercise against resistance later as your leg gets stronger.   CONSTIPATION  Constipation is defined medically as fewer than three stools per week and severe constipation as less than one stool per week.  Even if you have a regular bowel pattern at home, your normal regimen is likely to be disrupted due to multiple reasons following surgery.  Combination of anesthesia, postoperative narcotics, change in appetite and fluid intake all can affect your bowels.   YOU MUST use at least one of the following options; they are listed in order of increasing strength to get the job done.  They are all available over the counter, and you may need to use some, POSSIBLY even all of these options:    Drink plenty of fluids (prune juice may be helpful) and high fiber foods Colace 100 mg by mouth twice a day  Senokot for constipation as directed and as needed Dulcolax (bisacodyl), take with full glass of water  Miralax  (polyethylene glycol) once or twice a day as needed.  If you have tried all these things and are unable to have a bowel movement in the first 3-4 days after surgery call either your surgeon or your primary doctor.    If you experience loose stools or diarrhea, hold the medications until you stool forms back up.  If your symptoms do not get better within 1 week or if they get worse, check with your doctor.  If you experience "the worst abdominal pain ever" or develop nausea or vomiting, please contact the office immediately for further recommendations for treatment.   ITCHING:  If you experience itching with your medications, try taking only a single pain pill, or even half a pain pill at a time.  You can also use Benadryl over the counter for itching or also to help with sleep.   TED HOSE STOCKINGS:  Use stockings on both legs until for at least 2 weeks or as directed by physician office. They may be removed at night for sleeping.  MEDICATIONS:  See your medication summary on the "After Visit Summary" that nursing will review with you.  You may have some home medications which will be placed on hold until you complete the course of blood thinner medication.  It is important for you to complete the blood thinner medication as prescribed.  PRECAUTIONS:  If you experience chest pain or shortness of breath - call 911 immediately for transfer to the hospital emergency department.   If you develop a fever greater that 101 F, purulent drainage from wound, increased redness or drainage from wound, foul odor from the wound/dressing, or calf pain - CONTACT YOUR SURGEON.                                                   FOLLOW-UP APPOINTMENTS:  If you do not already have a post-op appointment, please call the office for an appointment to be seen by your surgeon.  Guidelines for how soon to be seen are listed in your "After Visit Summary", but are typically between 1-4 weeks after surgery.  OTHER  INSTRUCTIONS:   MAKE SURE YOU:  Understand these instructions.  Get help right away if you are not doing well or get worse.    Thank you for letting us be a part of your medical care team.  It is a privilege we respect greatly.  We hope these instructions will help you stay on track for a fast and full recovery!     Driving restrictions    Complete by:  As directed   No driving for 6 weeks     Follow the hip precautions as taught in Physical Therapy    Complete by:  As directed      Increase activity slowly as tolerated    Complete by:  As directed      Lifting restrictions    Complete by:  As directed   No lifting for 6 weeks     Patient may shower    Complete by:  As directed   [place].  DO NOT REMOVE DRESSING     TED hose    Complete by:  As directed   Use stockings (TED hose) for 2 weeks on RIGHT leg(s).  You may remove them at night for sleeping.     Weight bearing as tolerated    Complete by:  As directed   Laterality:  right  Extremity:  Lower           DISCHARGE MEDICATIONS:     Medication List    STOP taking these medications        aspirin 81 MG tablet  Replaced by:  aspirin 325 MG EC tablet     ibuprofen 600 MG tablet  Commonly known as:  ADVIL,MOTRIN     traMADol 50 MG tablet  Commonly known as:  ULTRAM      TAKE these medications        aspirin 325 MG EC tablet  Take 1 tablet (325 mg total) by mouth daily.     diphenhydrAMINE 25 MG tablet  Commonly known as:  BENADRYL  Take 25 mg by mouth every 6 (six) hours as needed for allergies.     DULoxetine 60 MG capsule  Commonly known as:  CYMBALTA  Take 60 mg by mouth daily.  HYDROcodone-acetaminophen 7.5-325 MG tablet  Commonly known as:  NORCO  Take 1 tablet by mouth every 4 (four) hours as needed for moderate pain or severe pain.     isosorbide mononitrate 120 MG 24 hr tablet  Commonly known as:  IMDUR  Take 120 mg by mouth daily.     lisinopril 10 MG tablet    Commonly known as:  PRINIVIL,ZESTRIL  Take 10 mg by mouth daily.     methocarbamol 500 MG tablet  Commonly known as:  ROBAXIN  Take 1 tablet (500 mg total) by mouth every 8 (eight) hours as needed for muscle spasms.     metoprolol succinate 50 MG 24 hr tablet  Commonly known as:  TOPROL-XL  Take 50 mg by mouth daily. Take with or immediately following a meal.     nitroGLYCERIN 0.4 MG SL tablet  Commonly known as:  NITROSTAT  Place 0.4 mg under the tongue every 5 (five) minutes as needed for chest pain.     pravastatin 40 MG tablet  Commonly known as:  PRAVACHOL  Take 40 mg by mouth daily.     PRILOSEC 20 MG capsule  Generic drug:  omeprazole  Take 20 mg by mouth daily. OTC     TEGRETOL-XR 200 MG 12 hr tablet  Generic drug:  carbamazepine  Take 200 mg by mouth daily.       ** Need to draw CBC and BMET on 10/15 and 07/13/2015.  Notify MD at facility of results  **   FOLLOW UP VISIT:   Follow-up Information    Follow up with [facility], Vonna Kotyk, MD. Schedule an appointment as soon as possible for a visit on 07/23/2015.   Specialty:  Orthopedic Surgery   Contact information:   [address] [address] [phone]       DISPOSITION:   Skilled Nursing Facility/Rehab  CONDITION:  Stable   Mike Craze. [place], [place] [phone]  07/11/2015 9:52 AM

## 2015-07-10 NOTE — Evaluation (Signed)
Occupational Therapy Evaluation Patient Details Name: Carrie Gillespie MRN: [id number] DOB: Feb 21, 1920 Today's Date: 07/10/2015    History of Present Illness Pt is a 81 y/o F s/p Rt THA.  Pt's PMH includes Lt TKA, Lt THA, lumbar spine surgery, depression, seizures, peripheal neuropathy, anxiety.   Clinical Impression   Pt is s/p THA resulting in the deficits listed below (see OT Problem List) Pt will benefit from skilled OT to increase their safety and independence with ADL and functional mobility for ADL to facilitate discharge to venue listed below.      Follow Up Recommendations  SNF    Equipment Recommendations  None recommended by OT    Recommendations for Other Services       Precautions / Restrictions Precautions Precautions: Posterior Hip;Fall Precaution Booklet Issued: Yes (comment) Precaution Comments: Reviewed all hip precautions Required Braces or Orthoses: Knee Immobilizer - Right Knee Immobilizer - Right: Other (comment) (not specified in order) Restrictions Weight Bearing Restrictions: Yes RLE Weight Bearing: Weight bearing as tolerated      Mobility Bed Mobility Overal bed mobility: Needs Assistance Bed Mobility: Supine to Sit     Supine to sit: Mod assist;HOB elevated        Transfers Overall transfer level: Needs assistance Equipment used: Rolling walker (2 wheeled) Transfers: Sit to/from Stand Sit to Stand: Min assist;From elevated surface;Mod assist                   ADL Overall ADL's : Needs assistance/impaired Eating/Feeding: Set up;Sitting   Grooming: Set up;Standing                                 General ADL Comments: pt declined getting to chair as she had only been back to bed for 30 min. Pt did agree to EOB.  Pt realizes she will need ST SNF upon DC. Pt is very spunky for 89!               Pertinent Vitals/Pain Pain Score: 6  Pain Location: r hip  Pain Descriptors / Indicators: Sore Pain  Intervention(s): Monitored during session;Premedicated before session;Limited activity within patient's tolerance     Hand Dominance     Extremity/Trunk Assessment Upper Extremity Assessment Upper Extremity Assessment: Generalized weakness           Communication Communication Communication: HOH   Cognition Arousal/Alertness: Awake/alert Behavior During Therapy: WFL for tasks assessed/performed Overall Cognitive Status: Within Functional Limits for tasks assessed                     General Comments   wants rehab in [place] at [facility] expects to be discharged to:: Skilled nursing facility Living Arrangements: Alone                                      Prior Functioning/Environment Level of Independence: Independent with assistive device(s)        Comments: PTA using RW at all times as she has fallen "quite a few times" in the past 6 months    OT Diagnosis: Generalized weakness   OT Problem List: Decreased strength;Decreased activity tolerance;Pain   OT Treatment/Interventions: Self-care/ADL training;DME and/or AE instruction;Patient/family education    OT Goals(Current goals can be found in  the care plan section) Acute Rehab OT Goals Patient Stated Goal: to go to rehab then home OT Goal Formulation: With patient Time For Goal Achievement: 07/24/15  OT Frequency: Min 2X/week              End of Session Nurse Communication: Mobility status  Activity Tolerance: Patient tolerated treatment well Patient left: in bed;with call bell/phone within reach   Time: 1122-1138 OT Time Calculation (min): 16 min Charges:  OT General Charges $OT Visit: 1 Procedure OT Evaluation $Initial OT Evaluation Tier I: 1 Procedure G-Codes:    Payton Mccallum D 2015-08-03, 11:56 AM

## 2015-07-10 NOTE — Progress Notes (Addendum)
Patient ID: Carrie Gillespie, female   DOB: 03-27-1920, 81 y.o.   MRN: [id number] PATIENT ID: Carrie Gillespie        MRN:  [id number]          DOB/AGE: [birth year] / 81 y.o.    [name], MD   Biagio Borg, PA-C [address] [place], [place]  [address]                             [phone]   PROGRESS NOTE  Subjective:  negative for Chest Pain  negative for Shortness of Breath  negative for Nausea/Vomiting   negative for Calf Pain    Tolerating Diet: yes         Patient reports pain as mild and moderate.     Eating lunch  Objective: Vital signs in last 24 hours:   Patient Vitals for the past 24 hrs:  BP Temp Temp src Pulse Resp SpO2  07/10/15 1055 (!) 146/91 mmHg - - - - -  07/10/15 0654 (!) 176/67 mmHg 99.6 F (37.6 C) Oral 91 16 94 %  07/09/15 2222 (!) 97/59 mmHg 99.1 F (37.3 C) Oral 82 18 93 %  07/09/15 1500 (!) 176/57 mmHg 98.1 F (36.7 C) Oral 85 18 98 %      Intake/Output from previous day:   10/12 0701 - 10/13 0700 In: 640 [P.O.:640] Out: 350 [Urine:350]   Intake/Output this shift:       Intake/Output      10/12 0701 - 10/13 0700 10/13 0701 - 10/14 0700   P.O. 640    I.V. (mL/kg)     IV Piggyback     Total Intake(mL/kg) 640 (8)    Urine (mL/kg/hr) 350 (0.2)    Blood     Total Output 350     Net +290          Urine Occurrence 3 x       LABORATORY DATA:  Recent Labs  07/09/15 0626 07/10/15 0512  WBC 8.3 9.8  HGB 9.2* 8.5*  HCT 27.5* 25.6*  PLT 160 173    Recent Labs  07/09/15 0626 07/10/15 0512 07/10/15 0935  NA 130* 125* 128*  K 4.5 4.2 4.5  CL 93* 89* 92*  CO2 26 28 27   BUN 14 11 11   CREATININE 0.73 0.75 0.72  GLUCOSE 126* 130* 130*  CALCIUM 8.7* 8.4* 9.0   Lab Results  Component Value Date   INR 1.00 06/27/2015   INR 1.00 12/18/2014    Recent Radiographic Studies :  Dg Hip Port Unilat With Pelvis 1v Right  07/08/2015  CLINICAL DATA:  Right hip pain. EXAM: DG HIP (WITH OR WITHOUT PELVIS) 1V PORT RIGHT COMPARISON:   None. FINDINGS: Surgical staples and air noted over the right hip. Bilateral hip replacements. Normal alignment. Hardware intact. No acute bony abnormality identified. Normal alignment. Surgical staples noted over the right hip. IMPRESSION: Right hip replacement.  Good anatomic alignment. Electronically Signed   By: Marcello Moores  Register   On: 07/08/2015 11:12     Examination:  General appearance: alert, cooperative, mild distress and moderate distress Resp: clear to auscultation bilaterally Cardio: regular rate and rhythm GI: normal findings: bowel sounds normal  Wound Exam: clean, dry, intact   Drainage:  None: wound tissue dry  Motor Exam: EHL, FHL, Anterior Tibial and Posterior Tibial Intact  Sensory Exam: Superficial Peroneal, Deep Peroneal and Tibial normal  Vascular Exam: Right  posterior tibial artery has trace pulse  Assessment:    2 Days Post-Op  Procedure(s) (LRB): TOTAL HIP ARTHROPLASTY (Right)  ADDITIONAL DIAGNOSIS:  Principal Problem:   Primary osteoarthritis of right hip Active Problems:   S/P total hip arthroplasty  Acute Blood Loss Anemia asymptomatic without hypotension   Plan: Physical Therapy as ordered Weight Bearing as Tolerated (WBAT)  DVT Prophylaxis:  ASA 325 DAILY, Foot Pumps and TED hose  DISCHARGE PLAN: Skilled Nursing Facility/Rehab  DISCHARGE NEEDS: HHPT, Walker and 3-in-1 comode seat        PETRARCA,BRIAN  07/10/2015 1:23 PM

## 2015-07-10 NOTE — Progress Notes (Signed)
Physical Therapy Treatment Patient Details Name: Carrie Gillespie MRN: [id number] DOB: [birth year] Today's Date: 07/10/2015    History of Present Illness Pt is a 81 y/o F s/p Rt THA.  Pt's PMH includes Lt TKA, Lt THA, lumbar spine surgery, depression, seizures, peripheal neuropathy, anxiety.    PT Comments    POD # 2 pm session.  Assisted out of reliner to amb to bathroom to void.  Assisted with balance while pt performed self care.  Assisted to static standing at sink with X 1 lateral LOB.  Assisted with amb a greater distance in hallway.  Progressing slowly and plans to D/C to ST Rehab at [facility] in [place].  Follow Up Recommendations  SNF (in [place])     Equipment Recommendations       Recommendations for Other Services       Precautions / Restrictions Precautions Precautions: Posterior Hip;Fall Precaution Booklet Issued: Yes (comment) Precaution Comments: pt recalls 3/3 THP Required Braces or Orthoses: Knee Immobilizer - Right Knee Immobilizer - Right: Other (comment) (no specific order, assume for on while in bed for  positioning due to posterior THP) Restrictions Weight Bearing Restrictions: No RLE Weight Bearing: Weight bearing as tolerated    Mobility  Bed Mobility Overal bed mobility: Needs Assistance Bed Mobility: Supine to Sit     Supine to sit: Min assist;Mod assist     General bed mobility comments: assist for R LE whith supine to sit to EOB increased time  Transfers Overall transfer level: Needs assistance Equipment used: Rolling walker (2 wheeled) Transfers: Sit to/from Stand Sit to Stand: Min assist;From elevated surface;Mod assist         General transfer comment: Min assist to power up to standing and cues provided for technique and hand placement while adhering to THP.  Assisted off bed and on/off elevated toilet.    Ambulation/Gait Ambulation/Gait assistance: Min assist Ambulation Distance (Feet): 65 Feet Assistive device: Rolling walker (2  wheeled) Gait Pattern/deviations: Step-to pattern;Decreased stance time - right;Trunk flexed Gait velocity: decreased   General Gait Details: 25% VC's on safety with turns and negociating around obsticles.  slow.  X 3 standing rest breaks with mils c/o fatigue.    Stairs            Wheelchair Mobility    Modified Rankin (Stroke Patients Only)       Balance                                    Cognition Arousal/Alertness: Awake/alert Behavior During Therapy: WFL for tasks assessed/performed Overall Cognitive Status: Within Functional Limits for tasks assessed                      Exercises      General Comments        Pertinent Vitals/Pain Pain Assessment: 0-10 Pain Score: 5  Pain Location: R hip Pain Descriptors / Indicators: Grimacing;Sore Pain Intervention(s): Monitored during session;Repositioned    Home Living Family/patient expects to be discharged to:: Skilled nursing facility Living Arrangements: Alone                  Prior Function Level of Independence: Independent with assistive device(s)      Comments: PTA using RW at all times as she has fallen "quite a few times" in the past 6 months   PT Goals (current goals can now be found in the  care plan section) Acute Rehab PT Goals Patient Stated Goal: to go to rehab then home Progress towards PT goals: Progressing toward goals    Frequency  7X/week    PT Plan      Co-evaluation             End of Session Equipment Utilized During Treatment: Gait belt Activity Tolerance: Patient tolerated treatment well Patient left: in chair;with call bell/phone within reach;with family/visitor present;with nursing/sitter in room     Time: 1405-1430 PT Time Calculation (min) (ACUTE ONLY): 25 min  Charges:  $Gait Training: 8-22 mins $Therapeutic Activity: 8-22 mins                    G Codes:      Nathanial Rancher 07-14-15, 2:34 PM

## 2015-07-11 DIAGNOSIS — Z96642 Presence of left artificial hip joint: Secondary | ICD-10-CM | POA: Diagnosis not present

## 2015-07-11 DIAGNOSIS — G589 Mononeuropathy, unspecified: Secondary | ICD-10-CM | POA: Diagnosis not present

## 2015-07-11 DIAGNOSIS — K219 Gastro-esophageal reflux disease without esophagitis: Secondary | ICD-10-CM | POA: Diagnosis not present

## 2015-07-11 DIAGNOSIS — R262 Difficulty in walking, not elsewhere classified: Secondary | ICD-10-CM | POA: Diagnosis not present

## 2015-07-11 DIAGNOSIS — K5900 Constipation, unspecified: Secondary | ICD-10-CM | POA: Diagnosis not present

## 2015-07-11 DIAGNOSIS — Z471 Aftercare following joint replacement surgery: Secondary | ICD-10-CM | POA: Diagnosis not present

## 2015-07-11 DIAGNOSIS — I739 Peripheral vascular disease, unspecified: Secondary | ICD-10-CM | POA: Diagnosis not present

## 2015-07-11 DIAGNOSIS — M169 Osteoarthritis of hip, unspecified: Secondary | ICD-10-CM | POA: Diagnosis not present

## 2015-07-11 DIAGNOSIS — Z96641 Presence of right artificial hip joint: Secondary | ICD-10-CM | POA: Diagnosis not present

## 2015-07-11 DIAGNOSIS — Z96649 Presence of unspecified artificial hip joint: Secondary | ICD-10-CM | POA: Diagnosis not present

## 2015-07-11 DIAGNOSIS — M1611 Unilateral primary osteoarthritis, right hip: Secondary | ICD-10-CM | POA: Diagnosis not present

## 2015-07-11 DIAGNOSIS — I10 Essential (primary) hypertension: Secondary | ICD-10-CM | POA: Diagnosis not present

## 2015-07-11 DIAGNOSIS — H3530 Unspecified macular degeneration: Secondary | ICD-10-CM | POA: Diagnosis not present

## 2015-07-11 DIAGNOSIS — R569 Unspecified convulsions: Secondary | ICD-10-CM | POA: Diagnosis not present

## 2015-07-11 DIAGNOSIS — E785 Hyperlipidemia, unspecified: Secondary | ICD-10-CM | POA: Diagnosis not present

## 2015-07-11 DIAGNOSIS — K5790 Diverticulosis of intestine, part unspecified, without perforation or abscess without bleeding: Secondary | ICD-10-CM | POA: Diagnosis not present

## 2015-07-11 DIAGNOSIS — F329 Major depressive disorder, single episode, unspecified: Secondary | ICD-10-CM | POA: Diagnosis not present

## 2015-07-11 DIAGNOSIS — I2510 Atherosclerotic heart disease of native coronary artery without angina pectoris: Secondary | ICD-10-CM | POA: Diagnosis not present

## 2015-07-11 DIAGNOSIS — H409 Unspecified glaucoma: Secondary | ICD-10-CM | POA: Diagnosis not present

## 2015-07-11 DIAGNOSIS — M6281 Muscle weakness (generalized): Secondary | ICD-10-CM | POA: Diagnosis not present

## 2015-07-11 DIAGNOSIS — E871 Hypo-osmolality and hyponatremia: Secondary | ICD-10-CM | POA: Diagnosis not present

## 2015-07-11 LAB — CBC
HCT: 22.8 % — ABNORMAL LOW (ref 36.0–46.0)
Hemoglobin: 7.7 g/dL — ABNORMAL LOW (ref 12.0–15.0)
MCH: 30 pg (ref 26.0–34.0)
MCHC: 33.8 g/dL (ref 30.0–36.0)
MCV: 88.7 fL (ref 78.0–100.0)
PLATELETS: 163 10*3/uL (ref 150–400)
RBC: 2.57 MIL/uL — AB (ref 3.87–5.11)
RDW: 13.6 % (ref 11.5–15.5)
WBC: 9.3 10*3/uL (ref 4.0–10.5)

## 2015-07-11 LAB — BASIC METABOLIC PANEL
Anion gap: 9 (ref 5–15)
BUN: 14 mg/dL (ref 6–20)
CALCIUM: 8.5 mg/dL — AB (ref 8.9–10.3)
CO2: 27 mmol/L (ref 22–32)
Chloride: 90 mmol/L — ABNORMAL LOW (ref 101–111)
Creatinine, Ser: 0.8 mg/dL (ref 0.44–1.00)
GFR calc Af Amer: >60 mL/min (ref >60)
GLUCOSE: 109 mg/dL — AB (ref 65–99)
Potassium: 4.1 mmol/L (ref 3.5–5.1)
SODIUM: 126 mmol/L — AB (ref 135–145)

## 2015-07-11 NOTE — Clinical Social Work Note (Signed)
Clinical Social Worker facilitated patient discharge including contacting patient family and facility to confirm patient discharge plans.  Clinical information faxed to facility and family agreeable with plan.  Patient's daughter reported that she will arrive to transport patinet to [place] between 1 and 2PM.  RN to call report prior to discharge.  DC packet prepared and on chart for transport.   Clinical Social Worker will sign off for now as social work intervention is no longer needed. Please consult [place] again if new need arises.  Glendon Axe, MSW, LCSWA [phone] 07/11/2015 11:12 AM

## 2015-07-11 NOTE — Clinical Social Work Placement (Signed)
   CLINICAL SOCIAL WORK PLACEMENT  NOTE  Date:  07/11/2015  Patient Details  Name: Carrie Gillespie MRN: [id number] Date of Birth: 1920-05-22  Clinical Social Work is seeking post-discharge placement for this patient at the [place] level of care (*CSW will initial, date and re-position this form in  chart as items are completed):  Yes   Patient/family provided with [facility] Work Department's list of facilities offering this level of care within the geographic area requested by the patient (or if unable, by the patient's family).  Yes   Patient/family informed of their freedom to choose among providers that offer the needed level of care, that participate in Medicare, Medicaid or managed care program needed by the patient, have an available bed and are willing to accept the patient.  Yes   Patient/family informed of [place]'s ownership interest in [facility] and [facility], as well as of the fact that they are under no obligation to receive care at these facilities.  PASRR submitted to EDS on       PASRR number received on       Existing PASRR number confirmed on       FL2 transmitted to all facilities in geographic area requested by pt/family on 07/11/15     FL2 transmitted to all facilities within larger geographic area on       Patient informed that his/her managed care company has contracts with or will negotiate with certain facilities, including the following:        Yes   Patient/family informed of bed offers received.  Patient [facility] bed at [facility] and [facility]     Physician recommends and patient chooses bed at      Patient to be transferred to [facility] on 07/11/15.  Patient to be transferred to facility by  (Pt's dtr, Horris Latino to transport. )     Patient family notified on 07/11/15 of transfer.  Name of family member notified:   (Pt's dtr, Horris Latino via telephone. )      PHYSICIAN Please sign FL2     Additional Comment:    _______________________________________________ Glendon Axe, MSW, LCSWA [phone] 07/11/2015 11:10 AM

## 2015-07-11 NOTE — Clinical Social Work Note (Signed)
Clinical Social Work Assessment  Patient Details  Name: Carrie Gillespie MRN: [id number] Date of Birth: 11/01/1919  Date of referral:  07/10/15               Reason for consult:  Facility Placement                Permission sought to share information with:  Family Supports Permission granted to share information::  Yes, Verbal Permission Granted  Name::     Horris Latino patient's daughter  Agency::  [facility] SNF admissions  Relationship::     Contact Information:     Housing/Transportation Living arrangements for the past 2 months:  Single Family Home Source of Information:  Patient Patient Interpreter Needed:  None Criminal Activity/Legal Involvement Pertinent to Current Situation/Hospitalization:  No - Comment as needed Significant Relationships:  Adult Children Lives with:  Self Do you feel safe going back to the place where you live?  Yes (Patient would like to return home once she has received some physical therapy.) Need for family participation in patient care:  No (Coment)  Care giving concerns:  Patient feels she needs short term rehab before she returns back home   Social Worker assessment / plan:  Patient is a 81 year old female who is alert and oriented x4, pleasant, and talkative.  Patient expressed that she has been to rehab before at [facility] in [place] and she had a good experience.  Patient expressed that she lives alone and wants to get short term rehab before she can return back home.  Patient was explained SNF process and how insurance pays for her stay at SNF.  Patient is in agreement to going to short term rehab and has preregistered at [facility].  Patient did not have any concerns or issues with going to a SNF.  Employment status:  Retired Forensic scientist:  [employer] PT Recommendations:  [place] / Referral to community resources:  [place]  Patient/Family's Response to care:  Patient and family in  agreement to going to SNF for short term rehab  Patient/Family's Understanding of and Emotional Response to Diagnosis, Current Treatment, and Prognosis:  Patient aware of her current treatment and prognosis.  Patient did not have any concerns or issues.  Emotional Assessment Appearance:  Appears stated age Attitude/Demeanor/Rapport:    Affect (typically observed):  Appropriate, Calm, Pleasant Orientation:  Oriented to Self, Oriented to Place, Oriented to  Time, Oriented to Situation Alcohol / Substance use:  Not Applicable Psych involvement (Current and /or in the community):  No (Comment)  Discharge Needs  Concerns to be addressed:  No discharge needs identified Readmission within the last 30 days:  No Current discharge risk:  Lives alone Barriers to Discharge:  No Barriers Identified   Ross Ludwig, LCSWA 07/10/15 3:00 pm

## 2015-07-11 NOTE — Clinical Social Work Placement (Signed)
   CLINICAL SOCIAL WORK PLACEMENT  NOTE  Date:  07/11/2015  Patient Details  Name: MARSELA COKLEY MRN: [id number] Date of Birth: [birth year]  Clinical Social Work is seeking post-discharge placement for this patient at the [place] level of care (*CSW will initial, date and re-position this form in  chart as items are completed):  Yes   Patient/family provided with [place] Work Department's list of facilities offering this level of care within the geographic area requested by the patient (or if unable, by the patient's family).  Yes   Patient/family informed of their freedom to choose among providers that offer the needed level of care, that participate in Medicare, Medicaid or managed care program needed by the patient, have an available bed and are willing to accept the patient.  Yes   Patient/family informed of [place]'s ownership interest in [facility] and [facility], as well as of the fact that they are under no obligation to receive care at these facilities.  PASRR submitted to EDS on       PASRR number received on       Existing PASRR number confirmed on       FL2 transmitted to all facilities in geographic area requested by pt/family on 07/11/15     FL2 transmitted to all facilities within larger geographic area on       Patient informed that his/her managed care company has contracts with or will negotiate with certain facilities, including the following:        Yes   Patient/family informed of bed offers received.  Patient chooses bed at [facility] and [facility]     Physician recommends and patient chooses bed at      Patient to be transferred to [facility] and [facility] on  .  Patient to be transferred to facility by       Patient family notified on   of transfer.  Name of family member notified:        PHYSICIAN Please sign FL2     Additional Comment:     _______________________________________________ Ross Ludwig, LCSWA 07/10/15 3:00 pm

## 2015-07-11 NOTE — Progress Notes (Signed)
Subjective: 3 Days Post-Op Procedure(s) (LRB): TOTAL HIP ARTHROPLASTY (Right) Patient reports pain as mild.   Sitting up in chair.  Objective: Vital signs in last 24 hours: Temp:  [97.6 F (36.4 C)-99.1 F (37.3 C)] 97.6 F (36.4 C) (10/14 0556) Pulse Rate:  [80-88] 80 (10/14 0556) Resp:  [16-18] 18 (10/14 0556) BP: (140-149)/(51-91) 149/64 mmHg (10/14 0556) SpO2:  [98 %-100 %] 100 % (10/14 0556)  Intake/Output from previous day: 10/13 0701 - 10/14 0700 In: 530 [P.O.:530] Out: -  Intake/Output this shift:     Recent Labs  07/09/15 0626 07/10/15 0512 07/11/15 0417  HGB 9.2* 8.5* 7.7*    Recent Labs  07/10/15 0512 07/11/15 0417  WBC 9.8 9.3  RBC 2.87* 2.57*  HCT 25.6* 22.8*  PLT 173 163    Recent Labs  07/10/15 2027 07/11/15 0417  NA 123* 126*  K 4.3 4.1  CL 87* 90*  CO2 26 27  BUN 14 14  CREATININE 0.92 0.80  GLUCOSE 141* 109*  CALCIUM 8.3* 8.5*   No results for input(s): LABPT, INR in the last 72 hours.  ABD soft Neurovascular intact Dorsiflexion/Plantar flexion intact Incision: dressing C/D/I  Assessment/Plan: 3 Days Post-Op Procedure(s) (LRB): TOTAL HIP ARTHROPLASTY (Right) Up with therapy Discharge to SNF   Alert and oriented. Labs similar to last hospitalization.  Plan D/C to [facility] 07/11/2015, 9:49 AM

## 2015-07-11 NOTE — Care Management Important Message (Signed)
Important Message  Patient Details  Name: Carrie Gillespie MRN: [id number] Date of Birth: July 16, 1920   Medicare Important Message Given:  Yes-second notification given    Delorse Lek 07/11/2015, 11:05 AM

## 2015-07-11 NOTE — Clinical Social Work Note (Signed)
CSW spoke with patient about SNF placement and she said she is preregistered at [facility] in [place].  CSW contacted facility to confirm this, facility stated they are expecting her for discharge on Friday 07/11/15.  SNF requested clinicals to be faxed to SNF, CSW faxed clinicals to facility.  Patient stated she would like her daughter Horris Latino to be notified once a discharge order has been received and she is medically ready.  CSW to continue to follow patient's discharge planning process.  Jones Broom. [place], MSW, [place] 07/10/15 3:00pm

## 2015-07-16 DIAGNOSIS — Z96649 Presence of unspecified artificial hip joint: Secondary | ICD-10-CM | POA: Diagnosis not present

## 2015-07-23 DIAGNOSIS — M1611 Unilateral primary osteoarthritis, right hip: Secondary | ICD-10-CM | POA: Diagnosis not present

## 2015-08-05 DIAGNOSIS — M169 Osteoarthritis of hip, unspecified: Secondary | ICD-10-CM | POA: Diagnosis not present

## 2015-08-29 DIAGNOSIS — G40802 Other epilepsy, not intractable, without status epilepticus: Secondary | ICD-10-CM | POA: Diagnosis not present

## 2015-08-29 DIAGNOSIS — E782 Mixed hyperlipidemia: Secondary | ICD-10-CM | POA: Diagnosis not present

## 2015-08-29 DIAGNOSIS — N182 Chronic kidney disease, stage 2 (mild): Secondary | ICD-10-CM | POA: Diagnosis not present

## 2015-08-29 DIAGNOSIS — K219 Gastro-esophageal reflux disease without esophagitis: Secondary | ICD-10-CM | POA: Diagnosis not present

## 2015-08-29 DIAGNOSIS — I10 Essential (primary) hypertension: Secondary | ICD-10-CM | POA: Diagnosis not present

## 2015-09-02 DIAGNOSIS — M1612 Unilateral primary osteoarthritis, left hip: Secondary | ICD-10-CM | POA: Diagnosis not present

## 2015-09-02 DIAGNOSIS — K219 Gastro-esophageal reflux disease without esophagitis: Secondary | ICD-10-CM | POA: Diagnosis not present

## 2015-09-02 DIAGNOSIS — R2681 Unsteadiness on feet: Secondary | ICD-10-CM | POA: Diagnosis not present

## 2015-09-02 DIAGNOSIS — G40802 Other epilepsy, not intractable, without status epilepticus: Secondary | ICD-10-CM | POA: Diagnosis not present

## 2015-09-02 DIAGNOSIS — Z96641 Presence of right artificial hip joint: Secondary | ICD-10-CM | POA: Diagnosis not present

## 2015-09-02 DIAGNOSIS — M1611 Unilateral primary osteoarthritis, right hip: Secondary | ICD-10-CM | POA: Diagnosis not present

## 2015-09-02 DIAGNOSIS — E871 Hypo-osmolality and hyponatremia: Secondary | ICD-10-CM | POA: Diagnosis not present

## 2015-09-02 DIAGNOSIS — M4800 Spinal stenosis, site unspecified: Secondary | ICD-10-CM | POA: Diagnosis not present

## 2015-09-02 DIAGNOSIS — Z96642 Presence of left artificial hip joint: Secondary | ICD-10-CM | POA: Diagnosis not present

## 2015-09-02 DIAGNOSIS — E782 Mixed hyperlipidemia: Secondary | ICD-10-CM | POA: Diagnosis not present

## 2015-09-02 DIAGNOSIS — N182 Chronic kidney disease, stage 2 (mild): Secondary | ICD-10-CM | POA: Diagnosis not present

## 2015-09-02 DIAGNOSIS — F330 Major depressive disorder, recurrent, mild: Secondary | ICD-10-CM | POA: Diagnosis not present

## 2015-09-03 DIAGNOSIS — M1711 Unilateral primary osteoarthritis, right knee: Secondary | ICD-10-CM | POA: Diagnosis not present

## 2015-10-20 DIAGNOSIS — Z91048 Other nonmedicinal substance allergy status: Secondary | ICD-10-CM | POA: Diagnosis not present

## 2015-10-20 DIAGNOSIS — F419 Anxiety disorder, unspecified: Secondary | ICD-10-CM | POA: Diagnosis not present

## 2015-10-20 DIAGNOSIS — M545 Low back pain: Secondary | ICD-10-CM | POA: Diagnosis not present

## 2015-10-20 DIAGNOSIS — G8929 Other chronic pain: Secondary | ICD-10-CM | POA: Diagnosis not present

## 2015-10-20 DIAGNOSIS — R0602 Shortness of breath: Secondary | ICD-10-CM | POA: Diagnosis not present

## 2015-10-20 DIAGNOSIS — Z955 Presence of coronary angioplasty implant and graft: Secondary | ICD-10-CM | POA: Diagnosis not present

## 2015-10-20 DIAGNOSIS — M1990 Unspecified osteoarthritis, unspecified site: Secondary | ICD-10-CM | POA: Diagnosis not present

## 2015-10-20 DIAGNOSIS — I209 Angina pectoris, unspecified: Secondary | ICD-10-CM | POA: Diagnosis not present

## 2015-10-20 DIAGNOSIS — G40909 Epilepsy, unspecified, not intractable, without status epilepticus: Secondary | ICD-10-CM | POA: Diagnosis not present

## 2015-10-20 DIAGNOSIS — R0789 Other chest pain: Secondary | ICD-10-CM | POA: Diagnosis not present

## 2015-10-20 DIAGNOSIS — I10 Essential (primary) hypertension: Secondary | ICD-10-CM | POA: Diagnosis not present

## 2015-10-20 DIAGNOSIS — R079 Chest pain, unspecified: Secondary | ICD-10-CM | POA: Diagnosis not present

## 2015-10-20 DIAGNOSIS — I2510 Atherosclerotic heart disease of native coronary artery without angina pectoris: Secondary | ICD-10-CM | POA: Diagnosis not present

## 2015-10-21 DIAGNOSIS — I209 Angina pectoris, unspecified: Secondary | ICD-10-CM | POA: Diagnosis not present

## 2015-10-21 DIAGNOSIS — I10 Essential (primary) hypertension: Secondary | ICD-10-CM | POA: Diagnosis not present

## 2015-10-21 DIAGNOSIS — M549 Dorsalgia, unspecified: Secondary | ICD-10-CM | POA: Diagnosis not present

## 2015-10-29 DIAGNOSIS — I10 Essential (primary) hypertension: Secondary | ICD-10-CM | POA: Diagnosis not present

## 2015-10-29 DIAGNOSIS — Z6831 Body mass index (BMI) 31.0-31.9, adult: Secondary | ICD-10-CM | POA: Diagnosis not present

## 2015-10-29 DIAGNOSIS — M5410 Radiculopathy, site unspecified: Secondary | ICD-10-CM | POA: Diagnosis not present

## 2015-11-05 DIAGNOSIS — Z961 Presence of intraocular lens: Secondary | ICD-10-CM | POA: Diagnosis not present

## 2015-11-05 DIAGNOSIS — H353131 Nonexudative age-related macular degeneration, bilateral, early dry stage: Secondary | ICD-10-CM | POA: Diagnosis not present

## 2015-11-05 DIAGNOSIS — H538 Other visual disturbances: Secondary | ICD-10-CM | POA: Diagnosis not present

## 2015-11-20 DIAGNOSIS — I209 Angina pectoris, unspecified: Secondary | ICD-10-CM | POA: Diagnosis not present

## 2015-11-20 DIAGNOSIS — R2681 Unsteadiness on feet: Secondary | ICD-10-CM | POA: Diagnosis not present

## 2015-11-20 DIAGNOSIS — N182 Chronic kidney disease, stage 2 (mild): Secondary | ICD-10-CM | POA: Diagnosis not present

## 2015-11-20 DIAGNOSIS — I200 Unstable angina: Secondary | ICD-10-CM | POA: Diagnosis not present

## 2015-11-20 DIAGNOSIS — I10 Essential (primary) hypertension: Secondary | ICD-10-CM | POA: Diagnosis not present

## 2015-11-20 DIAGNOSIS — M4800 Spinal stenosis, site unspecified: Secondary | ICD-10-CM | POA: Diagnosis not present

## 2015-11-28 ENCOUNTER — Ambulatory Visit (INDEPENDENT_AMBULATORY_CARE_PROVIDER_SITE_OTHER): Payer: Medicare Other | Admitting: Cardiology

## 2015-11-28 ENCOUNTER — Encounter: Payer: Self-pay | Admitting: Cardiology

## 2015-11-28 ENCOUNTER — Encounter: Payer: Self-pay | Admitting: *Deleted

## 2015-11-28 VITALS — BP 181/69 | HR 53 | Ht 62.0 in | Wt 184.0 lb

## 2015-11-28 DIAGNOSIS — R600 Localized edema: Secondary | ICD-10-CM

## 2015-11-28 DIAGNOSIS — I2510 Atherosclerotic heart disease of native coronary artery without angina pectoris: Secondary | ICD-10-CM

## 2015-11-28 MED ORDER — METOPROLOL TARTRATE 50 MG PO TABS: 25.0000 mg | ORAL_TABLET | Freq: Two times a day (BID) | ORAL | Status: AC

## 2015-11-28 NOTE — Progress Notes (Signed)
Patient ID: Carrie Gillespie, female   DOB: [birth year], 82 y.o.   MRN: [id number]     Clinical Summary Ms. Mcgrogan is a 82 y.o.female former patient of Dr Ron Parker, this is our first visit together. She is seen for the following medical problems.  1. CAD - history of stent to RCA in 2004. Nuclear stress negative for ischemia in 2012 - echo 07/2013 LVEF 65-70%,   - episode of chest pain several weeks ago. Pressure pain left chest, 8/10. Had some SOB. Better with NG. Lasted about 2 hours in duration. She reports she was told something about pancreatitis. Isolated episode, no recurrence.  - denies any SOB or DOE.  - compliant with meds     Past Medical History  Diagnosis Date  . Other and unspecified hyperlipidemia     takes Pravastatin daily  . Coronary atherosclerosis of native coronary artery     Status post stent to right coronary 2004, catheterization 2005 nonobstructive CAD, normal LV function, Cardiolite study 2008 negative for ischemia. Residual moderate ostial RCA disease. Stress testing 2012 negative for ischemia ejection fraction 78%  . Nerve entrapment syndrome of lower extremity      status post prior back surgery and weakness in left leg  . Arthritis   . Unspecified essential hypertension     takes Lisinopril,Metoprolol,and Imdur daily  . Depression     takes Cymbalta daily  . GERD (gastroesophageal reflux disease)     takes Omeprazole daily  . History of bronchitis     > 80yrs ago   . Seizures ([place])     takes Tegretol daily;has only had 1 seizure and that was several yrs ago.  . Peripheral neuropathy ([place])   . Joint pain   . Diverticulosis   . History of blood transfusion     no abnormal reaction noted  . Macular degeneration     dry  . Peripheral vascular disease, unspecified ([place])     Minimal internal carotid artery disease  . Carotid artery disease ([place])     with an occluded right vertebral artery, otherwise nonobstructive carotid artery disease  . Anxiety   .  Cough      Allergies  Allergen Reactions  . Latex Itching and Rash     Current Outpatient Prescriptions  Medication Sig Dispense Refill  . aspirin EC 325 MG EC tablet Take 1 tablet (325 mg total) by mouth daily. 30 tablet 0  . carbamazepine (TEGRETOL XR) 200 MG 12 hr tablet Take 200 mg by mouth daily.      . diphenhydrAMINE (BENADRYL) 25 MG tablet Take 25 mg by mouth every 6 (six) hours as needed for allergies.    . DULoxetine (CYMBALTA) 60 MG capsule Take 60 mg by mouth daily.    Marland Kitchen HYDROcodone-acetaminophen (NORCO) 7.5-325 MG tablet Take 1 tablet by mouth every 4 (four) hours as needed for moderate pain or severe pain. 30 tablet 0  . isosorbide mononitrate (IMDUR) 120 MG 24 hr tablet Take 120 mg by mouth daily.    Marland Kitchen lisinopril (PRINIVIL,ZESTRIL) 10 MG tablet Take 10 mg by mouth daily.     . methocarbamol (ROBAXIN) 500 MG tablet Take 1 tablet (500 mg total) by mouth every 8 (eight) hours as needed for muscle spasms. 20 tablet 0  . metoprolol succinate (TOPROL-XL) 50 MG 24 hr tablet Take 50 mg by mouth daily. Take with or immediately following a meal.    . nitroGLYCERIN (NITROSTAT) 0.4 MG SL tablet Place 0.4 mg under the  tongue every 5 (five) minutes as needed for chest pain.     Marland Kitchen omeprazole (PRILOSEC) 20 MG capsule Take 20 mg by mouth daily. OTC    . pravastatin (PRAVACHOL) 40 MG tablet Take 40 mg by mouth daily.     No current facility-administered medications for this visit.     Past Surgical History  Procedure Laterality Date  . Kidney surgery  2010    1/4 of right kidney removed [facility]  . Cardiac catheterization      nonobstructive coronary artery disease  . Cataract extraction    . Knee arthroplasty Left 2004  . Partial hysterectomy    . Coronary angioplasty  1998-1999  . Colonoscopy    . Back surgery  10/14  . Total hip arthroplasty Left 12/24/2014    Procedure: TOTAL HIP ARTHROPLASTY;  Surgeon: Garald Balding, MD;  Location: [facility];  Service: Orthopedics;   Laterality: Left;  . Total hip arthroplasty Right 07/08/2015  . Total hip arthroplasty Right 07/08/2015    Procedure: TOTAL HIP ARTHROPLASTY;  Surgeon: Garald Balding, MD;  Location: [facility];  Service: Orthopedics;  Laterality: Right;     Allergies  Allergen Reactions  . Latex Itching and Rash      Family History  Problem Relation Age of Onset  . Hypertension Other   . Diabetes Other   . Cancer Mother   . Diabetes Mother   . Kidney disease Sister      Social History Ms. Whipple reports that she has never smoked. She has never used smokeless tobacco. Ms. Rodrick reports that she does not drink alcohol.   Review of Systems CONSTITUTIONAL: No weight loss, fever, chills, weakness or fatigue.  HEENT: Eyes: No visual loss, blurred vision, double vision or yellow sclerae.No hearing loss, sneezing, congestion, runny nose or sore throat.  SKIN: No rash or itching.  CARDIOVASCULAR: per hpi RESPIRATORY: No shortness of breath, cough or sputum.  GASTROINTESTINAL: No anorexia, nausea, vomiting or diarrhea. No abdominal pain or blood.  GENITOURINARY: No burning on urination, no polyuria NEUROLOGICAL: No headache, dizziness, syncope, paralysis, ataxia, numbness or tingling in the extremities. No change in bowel or bladder control.  MUSCULOSKELETAL: No muscle, back pain, joint pain or stiffness.  LYMPHATICS: No enlarged nodes. No history of splenectomy.  PSYCHIATRIC: No history of depression or anxiety.  ENDOCRINOLOGIC: No reports of sweating, cold or heat intolerance. No polyuria or polydipsia.  Marland Kitchen   Physical Examination Filed Vitals:   11/28/15 1030  BP: 181/69  Pulse: 53   Filed Vitals:   11/28/15 1030  Height: 5\' 2"  (1.575 m)  Weight: 184 lb (83.462 kg)    Gen: resting comfortably, no acute distress HEENT: no scleral icterus, pupils equal round and reactive, no palptable cervical adenopathy,  CV: RRR, no m/r/g, no jvd Resp: Clear to auscultation bilaterally GI: abdomen is  soft, non-tender, non-distended, normal bowel sounds, no hepatosplenomegaly MSK: extremities are warm, 1+ bilateral LE edema Skin: warm, no rash Neuro:  no focal deficits Psych: appropriate affect     Assessment and Plan  1. CAD - isolated episode of chest pain, we will request [facility] records.  - continue to follow clinically, if reoccurs consider ischemic testing  2. LE edema - will prescribe compression stockings  F/u 3 compression stockings  Arnoldo Lenis, M.D.

## 2015-11-28 NOTE — Patient Instructions (Addendum)
   Decrease your Lopressor to 25mg  twice a day  - may take 1/2 tab of the 50mg  tablet you already have.  Continue all other medications.   Order given for compressions stockings today. Follow up in  3 months

## 2015-12-02 ENCOUNTER — Encounter: Payer: Self-pay | Admitting: *Deleted

## 2015-12-03 DIAGNOSIS — M160 Bilateral primary osteoarthritis of hip: Secondary | ICD-10-CM | POA: Diagnosis not present

## 2015-12-03 DIAGNOSIS — R079 Chest pain, unspecified: Secondary | ICD-10-CM | POA: Diagnosis not present

## 2015-12-03 DIAGNOSIS — M170 Bilateral primary osteoarthritis of knee: Secondary | ICD-10-CM | POA: Diagnosis not present

## 2016-01-02 DIAGNOSIS — Z8249 Family history of ischemic heart disease and other diseases of the circulatory system: Secondary | ICD-10-CM | POA: Diagnosis not present

## 2016-01-02 DIAGNOSIS — G43909 Migraine, unspecified, not intractable, without status migrainosus: Secondary | ICD-10-CM | POA: Diagnosis not present

## 2016-01-02 DIAGNOSIS — G8929 Other chronic pain: Secondary | ICD-10-CM | POA: Diagnosis not present

## 2016-01-02 DIAGNOSIS — E7800 Pure hypercholesterolemia, unspecified: Secondary | ICD-10-CM | POA: Diagnosis not present

## 2016-01-02 DIAGNOSIS — M79604 Pain in right leg: Secondary | ICD-10-CM | POA: Diagnosis not present

## 2016-01-02 DIAGNOSIS — Z7982 Long term (current) use of aspirin: Secondary | ICD-10-CM | POA: Diagnosis not present

## 2016-01-02 DIAGNOSIS — I10 Essential (primary) hypertension: Secondary | ICD-10-CM | POA: Diagnosis not present

## 2016-01-02 DIAGNOSIS — I209 Angina pectoris, unspecified: Secondary | ICD-10-CM | POA: Diagnosis not present

## 2016-01-02 DIAGNOSIS — M7989 Other specified soft tissue disorders: Secondary | ICD-10-CM | POA: Diagnosis not present

## 2016-01-02 DIAGNOSIS — M79605 Pain in left leg: Secondary | ICD-10-CM | POA: Diagnosis not present

## 2016-01-02 DIAGNOSIS — M25551 Pain in right hip: Secondary | ICD-10-CM | POA: Diagnosis not present

## 2016-01-02 DIAGNOSIS — Z96643 Presence of artificial hip joint, bilateral: Secondary | ICD-10-CM | POA: Diagnosis not present

## 2016-01-02 DIAGNOSIS — Z79899 Other long term (current) drug therapy: Secondary | ICD-10-CM | POA: Diagnosis not present

## 2016-02-24 DIAGNOSIS — E782 Mixed hyperlipidemia: Secondary | ICD-10-CM | POA: Diagnosis not present

## 2016-02-24 DIAGNOSIS — N182 Chronic kidney disease, stage 2 (mild): Secondary | ICD-10-CM | POA: Diagnosis not present

## 2016-02-24 DIAGNOSIS — K219 Gastro-esophageal reflux disease without esophagitis: Secondary | ICD-10-CM | POA: Diagnosis not present

## 2016-02-24 DIAGNOSIS — M1991 Primary osteoarthritis, unspecified site: Secondary | ICD-10-CM | POA: Diagnosis not present

## 2016-02-24 DIAGNOSIS — E559 Vitamin D deficiency, unspecified: Secondary | ICD-10-CM | POA: Diagnosis not present

## 2016-02-24 DIAGNOSIS — I10 Essential (primary) hypertension: Secondary | ICD-10-CM | POA: Diagnosis not present

## 2016-02-24 DIAGNOSIS — G40802 Other epilepsy, not intractable, without status epilepticus: Secondary | ICD-10-CM | POA: Diagnosis not present

## 2016-03-02 DIAGNOSIS — K219 Gastro-esophageal reflux disease without esophagitis: Secondary | ICD-10-CM | POA: Diagnosis not present

## 2016-03-02 DIAGNOSIS — I10 Essential (primary) hypertension: Secondary | ICD-10-CM | POA: Diagnosis not present

## 2016-03-02 DIAGNOSIS — M1612 Unilateral primary osteoarthritis, left hip: Secondary | ICD-10-CM | POA: Diagnosis not present

## 2016-03-02 DIAGNOSIS — N182 Chronic kidney disease, stage 2 (mild): Secondary | ICD-10-CM | POA: Diagnosis not present

## 2016-03-02 DIAGNOSIS — M1611 Unilateral primary osteoarthritis, right hip: Secondary | ICD-10-CM | POA: Diagnosis not present

## 2016-03-02 DIAGNOSIS — E782 Mixed hyperlipidemia: Secondary | ICD-10-CM | POA: Diagnosis not present

## 2016-03-02 DIAGNOSIS — M4800 Spinal stenosis, site unspecified: Secondary | ICD-10-CM | POA: Diagnosis not present

## 2016-03-11 ENCOUNTER — Ambulatory Visit (INDEPENDENT_AMBULATORY_CARE_PROVIDER_SITE_OTHER): Payer: Medicare Other | Admitting: Cardiology

## 2016-03-11 ENCOUNTER — Encounter: Payer: Self-pay | Admitting: *Deleted

## 2016-03-11 ENCOUNTER — Encounter: Payer: Self-pay | Admitting: Cardiology

## 2016-03-11 VITALS — BP 188/79 | HR 58 | Ht 62.0 in | Wt 187.0 lb

## 2016-03-11 DIAGNOSIS — I2510 Atherosclerotic heart disease of native coronary artery without angina pectoris: Secondary | ICD-10-CM

## 2016-03-11 DIAGNOSIS — R600 Localized edema: Secondary | ICD-10-CM

## 2016-03-11 DIAGNOSIS — R079 Chest pain, unspecified: Secondary | ICD-10-CM | POA: Diagnosis not present

## 2016-03-11 MED ORDER — FUROSEMIDE 20 MG PO TABS: ORAL_TABLET | ORAL | Status: DC

## 2016-03-11 MED ORDER — NITROGLYCERIN 0.4 MG SL SUBL: 0.4000 mg | SUBLINGUAL_TABLET | SUBLINGUAL | Status: DC | PRN

## 2016-03-11 NOTE — Patient Instructions (Signed)
Your physician recommends that you schedule a follow-up appointment in: 3 MONTHS WITH DR. [place]  Your physician has recommended you make the following change in your medication:   START LASIX 20 MG DAILY  Your physician has requested that you regularly monitor and record your blood pressure readings at home FOR 1 WEEK AND BRING BACK TO OUR OFFICE. Please use the same machine at the same time of day to check your readings and record them to bring to your follow-up visit.  Your physician has requested that you have en exercise stress myoview. For further information please visit [email or web address]. Please follow instruction sheet, as given.    Thank you for choosing [facility]!!

## 2016-03-11 NOTE — Progress Notes (Signed)
Clinical Summary Carrie Gillespie is a 82 y.o.female seen today for follow up of the following medical problems.   1. CAD - history of stent to RCA in 2004. Nuclear stress negative for ischemia in 2008 and 2012 - echo 07/2013 LVEF 65-70%,   - reports some occasional chest pain. Left shoulder pain and neck, not positional. No other associated symptoms. Can occur at rest or with activity.    2. HTN - compliant with meds  3. LE edema - reports occasional bilateral LE edema    Past Medical History  Diagnosis Date  . Other and unspecified hyperlipidemia     takes Pravastatin daily  . Coronary atherosclerosis of native coronary artery     Status post stent to right coronary 2004, catheterization 2005 nonobstructive CAD, normal LV function, Cardiolite study 2008 negative for ischemia. Residual moderate ostial RCA disease. Stress testing 2012 negative for ischemia ejection fraction 78%  . Nerve entrapment syndrome of lower extremity      status post prior back surgery and weakness in left leg  . Arthritis   . Unspecified essential hypertension     takes Lisinopril,Metoprolol,and Imdur daily  . Depression     takes Cymbalta daily  . GERD (gastroesophageal reflux disease)     takes Omeprazole daily  . History of bronchitis     > 5yrs ago   . Seizures ([place])     takes Tegretol daily;has only had 1 seizure and that was several yrs ago.  . Peripheral neuropathy ([place])   . Joint pain   . Diverticulosis   . History of blood transfusion     no abnormal reaction noted  . Macular degeneration     dry  . Peripheral vascular disease, unspecified ([place])     Minimal internal carotid artery disease  . Carotid artery disease ([place])     with an occluded right vertebral artery, otherwise nonobstructive carotid artery disease  . Anxiety   . Cough      Allergies  Allergen Reactions  . Latex Itching and Rash     Current Outpatient Prescriptions  Medication Sig Dispense Refill  .  aspirin EC 81 MG tablet Take 81 mg by mouth daily.    . carbamazepine (TEGRETOL XR) 200 MG 12 hr tablet Take 200 mg by mouth daily.      . diphenhydrAMINE (BENADRYL) 25 MG tablet Take 25 mg by mouth every 6 (six) hours as needed for allergies.    Marland Kitchen HYDROcodone-acetaminophen (NORCO) 7.5-325 MG tablet Take 1 tablet by mouth every 4 (four) hours as needed for moderate pain or severe pain. 30 tablet 0  . ibuprofen (ADVIL,MOTRIN) 600 MG tablet Take 600 mg by mouth 2 (two) times daily as needed.    . isosorbide mononitrate (IMDUR) 120 MG 24 hr tablet Take 120 mg by mouth daily.    Marland Kitchen lisinopril (PRINIVIL,ZESTRIL) 10 MG tablet Take 10 mg by mouth daily.     . metoprolol (LOPRESSOR) 50 MG tablet Take 0.5 tablets (25 mg total) by mouth 2 (two) times daily.    . nitroGLYCERIN (NITROSTAT) 0.4 MG SL tablet Place 0.4 mg under the tongue every 5 (five) minutes as needed for chest pain.     Marland Kitchen omeprazole (PRILOSEC) 20 MG capsule Take 20 mg by mouth daily. OTC    . pravastatin (PRAVACHOL) 40 MG tablet Take 40 mg by mouth daily.     No current facility-administered medications for this visit.     Past Surgical History  Procedure Laterality Date  . Kidney surgery  2010    1/4 of right kidney removed [facility]  . Cardiac catheterization      nonobstructive coronary artery disease  . Cataract extraction    . Knee arthroplasty Left 2004  . Partial hysterectomy    . Coronary angioplasty  1998-1999  . Colonoscopy    . Back surgery  10/14  . Total hip arthroplasty Left 12/24/2014    Procedure: TOTAL HIP ARTHROPLASTY;  Surgeon: Garald Balding, MD;  Location: [facility];  Service: Orthopedics;  Laterality: Left;  . Total hip arthroplasty Right 07/08/2015  . Total hip arthroplasty Right 07/08/2015    Procedure: TOTAL HIP ARTHROPLASTY;  Surgeon: Garald Balding, MD;  Location: [facility];  Service: Orthopedics;  Laterality: Right;     Allergies  Allergen Reactions  . Latex Itching and Rash      Family History    Problem Relation Age of Onset  . Hypertension Other   . Diabetes Other   . Cancer Mother   . Diabetes Mother   . Kidney disease Sister      Social History Carrie Gillespie reports that she has never smoked. She has never used smokeless tobacco. Carrie Gillespie reports that she does not drink alcohol.   Review of Systems CONSTITUTIONAL: No weight loss, fever, chills, weakness or fatigue.  HEENT: Eyes: No visual loss, blurred vision, double vision or yellow sclerae.No hearing loss, sneezing, congestion, runny nose or sore throat.  SKIN: No rash or itching.  CARDIOVASCULAR: per HPI RESPIRATORY: No shortness of breath, cough or sputum.  GASTROINTESTINAL: No anorexia, nausea, vomiting or diarrhea. No abdominal pain or blood.  GENITOURINARY: No burning on urination, no polyuria NEUROLOGICAL: No headache, dizziness, syncope, paralysis, ataxia, numbness or tingling in the extremities. No change in bowel or bladder control.  MUSCULOSKELETAL: No muscle, back pain, joint pain or stiffness.  LYMPHATICS: No enlarged nodes. No history of splenectomy.  PSYCHIATRIC: No history of depression or anxiety.  ENDOCRINOLOGIC: No reports of sweating, cold or heat intolerance. No polyuria or polydipsia.  Marland Kitchen   Physical Examination Filed Vitals:   03/11/16 0947  BP: 188/79  Pulse: 58   Filed Vitals:   03/11/16 0947  BP: 188/79  Pulse: 58    Filed Vitals:   03/11/16 0947  Height: 5\' 2"  (1.575 m)  Weight: 187 lb (84.823 kg)    Gen: resting comfortably, no acute distress HEENT: no scleral icterus, pupils equal round and reactive, no palptable cervical adenopathy,  CV: RRR, no m/r/g, no jvd, no carotid bruits Resp: Clear to auscultation bilaterally GI: abdomen is soft, non-tender, non-distended, normal bowel sounds, no hepatosplenomegaly MSK: extremities are warm, no edema.  Skin: warm, no rash Neuro:  no focal deficits Psych: appropriate affect   Diagnostic Studies 07/2013 Carotid  US Summary: Right: moderate intimal wall thickening CCA. Mild mixed plaque origin ICA. Left: mild sof tplaque distal CCA. Mild calcific plaque origin ICA with acoustic shadowing. Bilateral: 1-395 ICA stenosis. Vertebral artery flow is antegrade. ICA/CCA ratio: R-0.59 L-1.0    Assessment and Plan   1. CAD - recent episodes of chest pain. We will obtain an exercise nuclear stress test, modifified bruce protocol. Despite her age she remains independent and active, would consider cath if indicate.d   2. LE edema - will prescribe prn lasix.    F/u 3 months     Arnoldo Lenis, M.D.

## 2016-03-23 ENCOUNTER — Inpatient Hospital Stay (HOSPITAL_COMMUNITY): Admission: RE | Admit: 2016-03-23 | Payer: Medicare Other | Source: Ambulatory Visit

## 2016-03-23 ENCOUNTER — Encounter (HOSPITAL_COMMUNITY)
Admission: RE | Admit: 2016-03-23 | Discharge: 2016-03-23 | Disposition: A | Discharge status: Home / Self Care | Payer: Medicare Other | Source: Ambulatory Visit | Attending: Cardiology | Admitting: Cardiology

## 2016-03-23 ENCOUNTER — Encounter (HOSPITAL_COMMUNITY): Payer: Self-pay

## 2016-03-23 DIAGNOSIS — R079 Chest pain, unspecified: Secondary | ICD-10-CM

## 2016-03-23 LAB — NM MYOCAR MULTI W/SPECT W/WALL MOTION / EF
CHL CUP RESTING HR STRESS: 76 {beats}/min
LVDIAVOL: 58 mL (ref 46–106)
LVSYSVOL: 16 mL
NUC STRESS TID: 1.1
Peak HR: 103 {beats}/min
RATE: 0.38
SDS: 0
SRS: 0
SSS: 0

## 2016-03-23 MED ORDER — TECHNETIUM TC 99M TETROFOSMIN IV KIT
30.0000 | PACK | Freq: Once | INTRAVENOUS | Status: AC | PRN
  Administered 2016-03-23: 31 via INTRAVENOUS

## 2016-03-23 MED ORDER — SODIUM CHLORIDE 0.9% FLUSH
INTRAVENOUS | Status: AC
  Administered 2016-03-23: 10 mL via INTRAVENOUS
  Filled 2016-03-23: qty 10

## 2016-03-23 MED ORDER — REGADENOSON 0.4 MG/5ML IV SOLN
INTRAVENOUS | Status: AC
  Administered 2016-03-23: 0.4 mg via INTRAVENOUS
  Filled 2016-03-23: qty 5

## 2016-03-23 MED ORDER — TECHNETIUM TC 99M TETROFOSMIN IV KIT
10.0000 | PACK | Freq: Once | INTRAVENOUS | Status: AC | PRN
  Administered 2016-03-23: 11 via INTRAVENOUS

## 2016-03-24 ENCOUNTER — Telehealth: Payer: Self-pay | Admitting: *Deleted

## 2016-03-24 MED ORDER — AMLODIPINE BESYLATE 5 MG PO TABS: 5.0000 mg | ORAL_TABLET | Freq: Every day | ORAL | Status: DC

## 2016-03-24 NOTE — Telephone Encounter (Signed)
-----   Message from Arnoldo Lenis, MD sent at 03/24/2016  2:04 PM EDT ----- Stress test looks good, no evidence of any significant blockages. She needs to f/u with pcp to discuss noncardiac causes of her chest pain.Very high blood pressures at our clinic visit and during test, please start norvasc 5mg  daily. Does she have a bp cuff at home, can she submit a bp long in 2 weeks. If not needs nursing visit with bp check in 2 weeks.   Zandra Abts MD

## 2016-03-24 NOTE — Telephone Encounter (Signed)
Pt voiced understanding - does have BP cuff at home and will keep log for 2 weeks. Amlodipine sent to pharmacy as requested. Routed to pcp

## 2016-04-08 ENCOUNTER — Telehealth: Payer: Self-pay | Admitting: *Deleted

## 2016-04-08 NOTE — Telephone Encounter (Signed)
-----   Message from Arnoldo Lenis, MD sent at 04/08/2016  4:24 PM EDT ----- BP log shows numbers improving since staring new medicine. Can she keep them one more week and update [place].  Zandra Abts MD

## 2016-04-08 NOTE — Telephone Encounter (Signed)
Pt aware and will f/u with BP monitor in 1 week.

## 2016-04-15 DIAGNOSIS — Z7982 Long term (current) use of aspirin: Secondary | ICD-10-CM | POA: Diagnosis not present

## 2016-04-15 DIAGNOSIS — Z96643 Presence of artificial hip joint, bilateral: Secondary | ICD-10-CM | POA: Diagnosis not present

## 2016-04-15 DIAGNOSIS — Z79899 Other long term (current) drug therapy: Secondary | ICD-10-CM | POA: Diagnosis not present

## 2016-04-15 DIAGNOSIS — I10 Essential (primary) hypertension: Secondary | ICD-10-CM | POA: Diagnosis not present

## 2016-04-15 DIAGNOSIS — Z96652 Presence of left artificial knee joint: Secondary | ICD-10-CM | POA: Diagnosis not present

## 2016-04-15 DIAGNOSIS — Z780 Asymptomatic menopausal state: Secondary | ICD-10-CM | POA: Diagnosis not present

## 2016-04-15 DIAGNOSIS — M810 Age-related osteoporosis without current pathological fracture: Secondary | ICD-10-CM | POA: Diagnosis not present

## 2016-04-15 DIAGNOSIS — E7800 Pure hypercholesterolemia, unspecified: Secondary | ICD-10-CM | POA: Diagnosis not present

## 2016-04-15 DIAGNOSIS — M1990 Unspecified osteoarthritis, unspecified site: Secondary | ICD-10-CM | POA: Diagnosis not present

## 2016-04-18 DIAGNOSIS — Z79899 Other long term (current) drug therapy: Secondary | ICD-10-CM | POA: Diagnosis not present

## 2016-04-18 DIAGNOSIS — R110 Nausea: Secondary | ICD-10-CM | POA: Diagnosis not present

## 2016-04-18 DIAGNOSIS — R109 Unspecified abdominal pain: Secondary | ICD-10-CM | POA: Diagnosis not present

## 2016-04-18 DIAGNOSIS — R531 Weakness: Secondary | ICD-10-CM | POA: Diagnosis not present

## 2016-04-18 DIAGNOSIS — I519 Heart disease, unspecified: Secondary | ICD-10-CM | POA: Diagnosis not present

## 2016-04-18 DIAGNOSIS — R112 Nausea with vomiting, unspecified: Secondary | ICD-10-CM | POA: Diagnosis not present

## 2016-04-18 DIAGNOSIS — Z7982 Long term (current) use of aspirin: Secondary | ICD-10-CM | POA: Diagnosis not present

## 2016-04-18 DIAGNOSIS — I10 Essential (primary) hypertension: Secondary | ICD-10-CM | POA: Diagnosis not present

## 2016-04-18 DIAGNOSIS — K5730 Diverticulosis of large intestine without perforation or abscess without bleeding: Secondary | ICD-10-CM | POA: Diagnosis not present

## 2016-04-18 DIAGNOSIS — Z8249 Family history of ischemic heart disease and other diseases of the circulatory system: Secondary | ICD-10-CM | POA: Diagnosis not present

## 2016-04-18 DIAGNOSIS — R404 Transient alteration of awareness: Secondary | ICD-10-CM | POA: Diagnosis not present

## 2016-04-18 DIAGNOSIS — E7800 Pure hypercholesterolemia, unspecified: Secondary | ICD-10-CM | POA: Diagnosis not present

## 2016-04-18 DIAGNOSIS — R569 Unspecified convulsions: Secondary | ICD-10-CM | POA: Diagnosis not present

## 2016-04-18 DIAGNOSIS — Z955 Presence of coronary angioplasty implant and graft: Secondary | ICD-10-CM | POA: Diagnosis not present

## 2016-05-13 DIAGNOSIS — Z905 Acquired absence of kidney: Secondary | ICD-10-CM | POA: Diagnosis not present

## 2016-05-13 DIAGNOSIS — N281 Cyst of kidney, acquired: Secondary | ICD-10-CM | POA: Diagnosis not present

## 2016-05-13 DIAGNOSIS — Z85528 Personal history of other malignant neoplasm of kidney: Secondary | ICD-10-CM | POA: Diagnosis not present

## 2016-05-13 DIAGNOSIS — Z08 Encounter for follow-up examination after completed treatment for malignant neoplasm: Secondary | ICD-10-CM | POA: Diagnosis not present

## 2016-06-02 DIAGNOSIS — M160 Bilateral primary osteoarthritis of hip: Secondary | ICD-10-CM | POA: Diagnosis not present

## 2016-06-02 DIAGNOSIS — M170 Bilateral primary osteoarthritis of knee: Secondary | ICD-10-CM | POA: Diagnosis not present

## 2016-06-09 DIAGNOSIS — Z23 Encounter for immunization: Secondary | ICD-10-CM | POA: Diagnosis not present

## 2016-06-09 DIAGNOSIS — I209 Angina pectoris, unspecified: Secondary | ICD-10-CM | POA: Diagnosis not present

## 2016-06-09 DIAGNOSIS — F330 Major depressive disorder, recurrent, mild: Secondary | ICD-10-CM | POA: Diagnosis not present

## 2016-06-09 DIAGNOSIS — Z6833 Body mass index (BMI) 33.0-33.9, adult: Secondary | ICD-10-CM | POA: Diagnosis not present

## 2016-06-09 DIAGNOSIS — E782 Mixed hyperlipidemia: Secondary | ICD-10-CM | POA: Diagnosis not present

## 2016-06-09 DIAGNOSIS — I10 Essential (primary) hypertension: Secondary | ICD-10-CM | POA: Diagnosis not present

## 2016-06-09 DIAGNOSIS — N182 Chronic kidney disease, stage 2 (mild): Secondary | ICD-10-CM | POA: Diagnosis not present

## 2016-06-09 DIAGNOSIS — K219 Gastro-esophageal reflux disease without esophagitis: Secondary | ICD-10-CM | POA: Diagnosis not present

## 2016-06-18 DIAGNOSIS — W1839XA Other fall on same level, initial encounter: Secondary | ICD-10-CM | POA: Diagnosis not present

## 2016-06-18 DIAGNOSIS — Z79899 Other long term (current) drug therapy: Secondary | ICD-10-CM | POA: Diagnosis not present

## 2016-06-18 DIAGNOSIS — S0990XA Unspecified injury of head, initial encounter: Secondary | ICD-10-CM | POA: Diagnosis not present

## 2016-06-18 DIAGNOSIS — Z7982 Long term (current) use of aspirin: Secondary | ICD-10-CM | POA: Diagnosis not present

## 2016-06-18 DIAGNOSIS — S0001XA Abrasion of scalp, initial encounter: Secondary | ICD-10-CM | POA: Diagnosis not present

## 2016-06-18 DIAGNOSIS — Z9181 History of falling: Secondary | ICD-10-CM | POA: Diagnosis not present

## 2016-06-18 DIAGNOSIS — E7800 Pure hypercholesterolemia, unspecified: Secondary | ICD-10-CM | POA: Diagnosis not present

## 2016-06-18 DIAGNOSIS — R220 Localized swelling, mass and lump, head: Secondary | ICD-10-CM | POA: Diagnosis not present

## 2016-06-18 DIAGNOSIS — M25532 Pain in left wrist: Secondary | ICD-10-CM | POA: Diagnosis not present

## 2016-06-18 DIAGNOSIS — S0101XA Laceration without foreign body of scalp, initial encounter: Secondary | ICD-10-CM | POA: Diagnosis not present

## 2016-06-18 DIAGNOSIS — R569 Unspecified convulsions: Secondary | ICD-10-CM | POA: Diagnosis not present

## 2016-06-18 DIAGNOSIS — M25521 Pain in right elbow: Secondary | ICD-10-CM | POA: Diagnosis not present

## 2016-06-18 DIAGNOSIS — S52502A Unspecified fracture of the lower end of left radius, initial encounter for closed fracture: Secondary | ICD-10-CM | POA: Diagnosis not present

## 2016-06-18 DIAGNOSIS — M542 Cervicalgia: Secondary | ICD-10-CM | POA: Diagnosis not present

## 2016-06-18 DIAGNOSIS — I10 Essential (primary) hypertension: Secondary | ICD-10-CM | POA: Diagnosis not present

## 2016-06-18 DIAGNOSIS — S0083XA Contusion of other part of head, initial encounter: Secondary | ICD-10-CM | POA: Diagnosis not present

## 2016-06-18 DIAGNOSIS — S52592A Other fractures of lower end of left radius, initial encounter for closed fracture: Secondary | ICD-10-CM | POA: Diagnosis not present

## 2016-06-23 ENCOUNTER — Ambulatory Visit: Payer: Medicare Other | Admitting: Cardiology

## 2016-06-23 DIAGNOSIS — S52532A Colles' fracture of left radius, initial encounter for closed fracture: Secondary | ICD-10-CM | POA: Diagnosis not present

## 2016-06-24 ENCOUNTER — Ambulatory Visit: Payer: Medicare Other | Admitting: Cardiology

## 2016-07-07 ENCOUNTER — Ambulatory Visit (INDEPENDENT_AMBULATORY_CARE_PROVIDER_SITE_OTHER): Payer: Medicare Other | Admitting: Orthopaedic Surgery

## 2016-07-07 DIAGNOSIS — S52532D Colles' fracture of left radius, subsequent encounter for closed fracture with routine healing: Secondary | ICD-10-CM

## 2016-07-08 IMAGING — CR DG HIP (WITH OR WITHOUT PELVIS) 1V PORT*R*
1 series · 1 of 1 positions shown · non-contrast
Comparison: None.

CLINICAL DATA: Right hip pain.

EXAM:
DG HIP (WITH OR WITHOUT PELVIS) 1V PORT RIGHT

[AP]
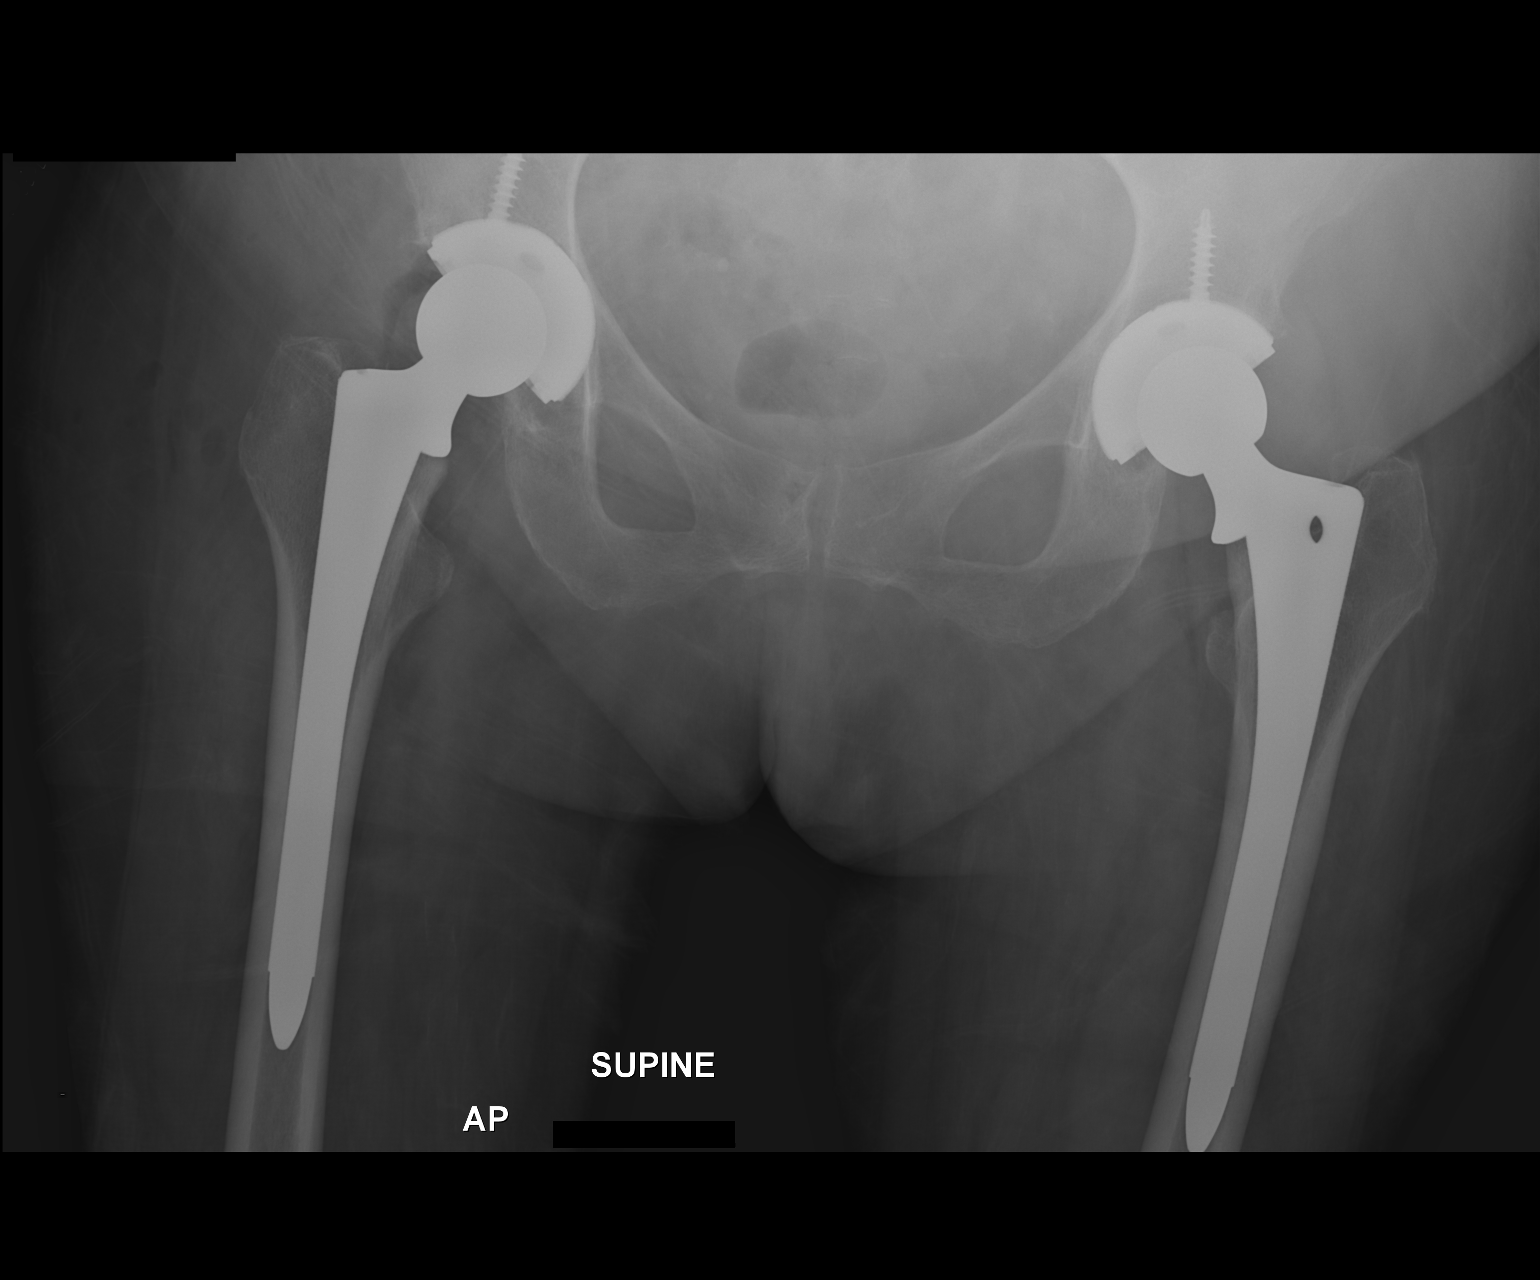

[1 of 1 positions shown; findings below may reference images not displayed]

FINDINGS: Surgical staples and air noted over the right hip. Bilateral hip
replacements. Normal alignment. Hardware intact. No acute bony
abnormality identified. Normal alignment. Surgical staples noted
over the right hip.
IMPRESSION: Right hip replacement.  Good anatomic alignment.

## 2016-07-14 ENCOUNTER — Encounter: Payer: Self-pay | Admitting: *Deleted

## 2016-07-15 ENCOUNTER — Ambulatory Visit (INDEPENDENT_AMBULATORY_CARE_PROVIDER_SITE_OTHER): Payer: Medicare Other | Admitting: Cardiology

## 2016-07-15 ENCOUNTER — Encounter: Payer: Self-pay | Admitting: Cardiology

## 2016-07-15 VITALS — BP 155/75 | HR 64 | Ht 62.0 in | Wt 187.0 lb

## 2016-07-15 DIAGNOSIS — I10 Essential (primary) hypertension: Secondary | ICD-10-CM

## 2016-07-15 DIAGNOSIS — R600 Localized edema: Secondary | ICD-10-CM | POA: Diagnosis not present

## 2016-07-15 DIAGNOSIS — E784 Other hyperlipidemia: Secondary | ICD-10-CM | POA: Diagnosis not present

## 2016-07-15 DIAGNOSIS — E7849 Other hyperlipidemia: Secondary | ICD-10-CM

## 2016-07-15 DIAGNOSIS — I2510 Atherosclerotic heart disease of native coronary artery without angina pectoris: Secondary | ICD-10-CM

## 2016-07-15 MED ORDER — PRAVASTATIN SODIUM 80 MG PO TABS: 80.0000 mg | ORAL_TABLET | Freq: Every evening | ORAL | 3 refills | Status: DC

## 2016-07-15 NOTE — Progress Notes (Signed)
Clinical Summary Ms. Gravelle is a 82 y.o.female seen today for follow up of the following medical problems.    1. CAD - history of stent to RCA in 2004. Nuclear stress negative for ischemia in 2008 and 2012 - echo 07/2013 LVEF 65-70%,   - 02/2016 lexiscan without significant ischemia - no recurrent pain since last visit.  - compliant with meds.    2. HTN - compliant with meds - checks at home occasionally, typically 140s/70s  3. LE edema - reports occasional bilateral LE edema. Overall unchanged.   4. Hyperlipidemia - 01/2016 TC 213 TG 225 HDL 47 LDL 121 Past Medical History:  Diagnosis Date  . Anxiety   . Arthritis   . Carotid artery disease ([place])    with an occluded right vertebral artery, otherwise nonobstructive carotid artery disease  . Coronary atherosclerosis of native coronary artery    Status post stent to right coronary 2004, catheterization 2005 nonobstructive CAD, normal LV function, Cardiolite study 2008 negative for ischemia. Residual moderate ostial RCA disease. Stress testing 2012 negative for ischemia ejection fraction 78%  . Cough   . Depression    takes Cymbalta daily  . Diverticulosis   . GERD (gastroesophageal reflux disease)    takes Omeprazole daily  . History of blood transfusion    no abnormal reaction noted  . History of bronchitis    > 91yrs ago   . Joint pain   . Macular degeneration    dry  . Nerve entrapment syndrome of lower extremity     status post prior back surgery and weakness in left leg  . Other and unspecified hyperlipidemia    takes Pravastatin daily  . Peripheral neuropathy ([place])   . Peripheral vascular disease, unspecified    Minimal internal carotid artery disease  . Seizures ([place])    takes Tegretol daily;has only had 1 seizure and that was several yrs ago.  Marland Kitchen Unspecified essential hypertension    takes Lisinopril,Metoprolol,and Imdur daily     Allergies  Allergen Reactions  . Latex Itching and Rash      Current Outpatient Prescriptions  Medication Sig Dispense Refill  . amLODipine (NORVASC) 5 MG tablet Take 1 tablet (5 mg total) by mouth daily. 90 tablet 3  . aspirin EC 81 MG tablet Take 81 mg by mouth daily.    . carbamazepine (TEGRETOL XR) 200 MG 12 hr tablet Take 200 mg by mouth daily.      . diphenhydrAMINE (BENADRYL) 25 MG tablet Take 25 mg by mouth every 6 (six) hours as needed for allergies.    . furosemide (LASIX) 20 MG tablet TAKE 1 TAB DAILY AS NEEDED FOR SWELLING 90 tablet 3  . ibuprofen (ADVIL,MOTRIN) 600 MG tablet Take 600 mg by mouth 2 (two) times daily as needed.    . isosorbide mononitrate (IMDUR) 120 MG 24 hr tablet Take 120 mg by mouth daily.    Marland Kitchen lisinopril (PRINIVIL,ZESTRIL) 10 MG tablet Take 10 mg by mouth daily.     . metoprolol (LOPRESSOR) 50 MG tablet Take 0.5 tablets (25 mg total) by mouth 2 (two) times daily.    . nitroGLYCERIN (NITROSTAT) 0.4 MG SL tablet Place 1 tablet (0.4 mg total) under the tongue every 5 (five) minutes as needed for chest pain. 25 tablet 3  . omeprazole (PRILOSEC) 20 MG capsule Take 20 mg by mouth daily. OTC    . pravastatin (PRAVACHOL) 40 MG tablet Take 40 mg by mouth daily.  No current facility-administered medications for this visit.      Past Surgical History:  Procedure Laterality Date  . BACK SURGERY  10/14  . CARDIAC CATHETERIZATION     nonobstructive coronary artery disease  . CATARACT EXTRACTION    . COLONOSCOPY    . CORONARY ANGIOPLASTY  [id number]  . KIDNEY SURGERY  2010   1/4 of right kidney removed [facility]  . KNEE ARTHROPLASTY Left 2004  . PARTIAL HYSTERECTOMY    . TOTAL HIP ARTHROPLASTY Left 12/24/2014   Procedure: TOTAL HIP ARTHROPLASTY;  Surgeon: Garald Balding, MD;  Location: [facility];  Service: Orthopedics;  Laterality: Left;  . TOTAL HIP ARTHROPLASTY Right 07/08/2015  . TOTAL HIP ARTHROPLASTY Right 07/08/2015   Procedure: TOTAL HIP ARTHROPLASTY;  Surgeon: Garald Balding, MD;  Location: [facility];  Service:  Orthopedics;  Laterality: Right;     Allergies  Allergen Reactions  . Latex Itching and Rash      Family History  Problem Relation Age of Onset  . Cancer Mother   . Diabetes Mother   . Kidney disease Sister   . Hypertension Other   . Diabetes Other      Social History Ms. Lacombe reports that she has never smoked. She has never used smokeless tobacco. Ms. Sarvis reports that she does not drink alcohol.   Review of Systems CONSTITUTIONAL: No weight loss, fever, chills, weakness or fatigue.  HEENT: Eyes: No visual loss, blurred vision, double vision or yellow sclerae.No hearing loss, sneezing, congestion, runny nose or sore throat.  SKIN: No rash or itching.  CARDIOVASCULAR: per hpi RESPIRATORY: No shortness of breath, cough or sputum.  GASTROINTESTINAL: No anorexia, nausea, vomiting or diarrhea. No abdominal pain or blood.  GENITOURINARY: No burning on urination, no polyuria NEUROLOGICAL: No headache, dizziness, syncope, paralysis, ataxia, numbness or tingling in the extremities. No change in bowel or bladder control.  MUSCULOSKELETAL: No muscle, back pain, joint pain or stiffness.  LYMPHATICS: No enlarged nodes. No history of splenectomy.  PSYCHIATRIC: No history of depression or anxiety.  ENDOCRINOLOGIC: No reports of sweating, cold or heat intolerance. No polyuria or polydipsia.  Marland Kitchen   Physical Examination Vitals:   07/15/16 1302  BP: (!) 155/75  Pulse: 64   Vitals:   07/15/16 1302  Weight: 187 lb (84.8 kg)  Height: 5\' 2"  (1.575 m)    Gen: resting comfortably, no acute distress HEENT: no scleral icterus, pupils equal round and reactive, no palptable cervical adenopathy,  CV: RRR, no m/r/g, no jvd Resp: Clear to auscultation bilaterally GI: abdomen is soft, non-tender, non-distended, normal bowel sounds, no hepatosplenomegaly MSK: extremities are warm, no edema.  Skin: warm, no rash Neuro:  no focal deficits Psych: appropriate affect   Diagnostic  Studies 07/2013 Carotid US Summary: Right: moderate intimal wall thickening CCA. Mild mixed plaque origin ICA. Left: mild sof tplaque distal CCA. Mild calcific plaque origin ICA with acoustic shadowing. Bilateral: 1-395 ICA stenosis. Vertebral artery flow is antegrade. ICA/CCA ratio: R-0.59 L-1.0   02/2016 Lexiscan Nuclear stress  Horizontal ST segment depression after Lexiscan injection. ST segment depression was noted during stress in the II, III, aVF, V5 and V6 leads. Nonspecific finding in setting of severe hypertension  The study is normal. There are no perfusion defects consistent with prior infarction or current ischemia.  This is a low risk study.  The left ventricular ejection fraction is hyperdynamic (>65%). Assessment and Plan   1. CAD - chest pain has resolved. Lexiscan without signifcant ischemia - continue  current meds  2. LE edema - continue diuretic  3. HTN - reasonable control given age, continue current meds  4. Hyperlpidemia - with her CAD history should be on statin. Given her age favor mild statin, start pravastatin 80mg  daily   F/u 6 months      Arnoldo Lenis, M.D., F.A.C.C.

## 2016-07-15 NOTE — Patient Instructions (Signed)
Your physician wants you to follow-up in: [place] [address] [facility]  You will receive a reminder letter in the mail two months in advance. If you don't receive a letter, please call our office to schedule the follow-up appointment.  Your physician has recommended you make the following change in your medication:   INCREASE PRAVASTATIN 80 MG DAILY  Thank you for choosing [facility]!!

## 2016-07-28 ENCOUNTER — Ambulatory Visit (INDEPENDENT_AMBULATORY_CARE_PROVIDER_SITE_OTHER): Payer: Medicare Other | Admitting: Orthopedic Surgery

## 2016-07-28 ENCOUNTER — Encounter (INDEPENDENT_AMBULATORY_CARE_PROVIDER_SITE_OTHER): Payer: Self-pay | Admitting: Orthopedic Surgery

## 2016-07-28 VITALS — BP 139/65 | HR 30 | Resp 14 | Ht 62.0 in | Wt 184.0 lb

## 2016-07-28 DIAGNOSIS — S52532D Colles' fracture of left radius, subsequent encounter for closed fracture with routine healing: Secondary | ICD-10-CM | POA: Diagnosis not present

## 2016-07-28 DIAGNOSIS — I2510 Atherosclerotic heart disease of native coronary artery without angina pectoris: Secondary | ICD-10-CM

## 2016-07-28 NOTE — Progress Notes (Signed)
Office Visit Note   Patient: Carrie Gillespie           Date of Birth: 1920-05-08           MRN: [id number] Visit Date: 07/28/2016              Requested by: Curlene Labrum, MD [place], [place] [address] PCP: Curlene Labrum, MD   Assessment & Plan: Visit Diagnoses:  1. Closed Colles' fracture of left radius with routine healing, subsequent encounter     Plan:  #1 she can start to wean out of her wrist brace and that over the last over the next 2-3 weeks.   #2 she may then she had the wrist immobilizer.   #3 if she is doing well she does not need to follow-up otherwise return in 4 weeks.  Follow-Up Instructions: Return in about 4 weeks (around 08/25/2016).   Orders:  No orders of the defined types were placed in this encounter.  No orders of the defined types were placed in this encounter.     Procedures: No procedures performed   Clinical Data: No additional findings.   Subjective: Chief Complaint  Patient presents with  . Left Wrist - Fracture    Carrie Gillespie is a 82 year old white female who is here for Follow up for Left non- displaced Left distal radius FX. Pt wears a velcro splint and her L hand is weak when out of the splint. She actually used her hand and wrist without the wrist immobilizer to do her bra and for bathing. She states she has no pain at this time. She just feels stiff. Denies any neurovascular compromise.    Review of Systems  Constitutional: Negative.   HENT: Negative.   Eyes: Negative.   Respiratory: Negative.   Cardiovascular: Negative.   Gastrointestinal: Negative.   Psychiatric/Behavioral: Negative.      Objective: Vital Signs: BP 139/65   Pulse (!) 30   Resp 14   Ht 5\' 2"  (1.575 m)   Wt 184 lb (83.5 kg)   BMI 33.65 kg/m   Physical Exam  Constitutional: She appears well-developed and well-nourished.  Eyes: EOM are normal. Pupils are equal, round, and reactive to light.  Cardiovascular: Normal rate.     Pulmonary/Chest: Effort normal.  Skin: Skin is warm and dry.  Psychiatric: She has a normal mood and affect. Her behavior is normal. Judgment and thought content normal.    Left Hand Exam   Range of Motion   Wrist  Extension: 15  Flexion: 20  Pronation: 70  Supination: 70   Muscle Strength  Wrist Extension: 3/5  Wrist Flexion: 3/5  Grip:  3/5   Other  Sensation: normal Pulse: present      Specialty Comments:  No specialty comments available.  Imaging: No results found.   PMFS History: Patient Active Problem List   Diagnosis Date Noted  . Primary osteoarthritis of right hip 07/08/2015  . Primary osteoarthritis of left hip 12/24/2014  . S/P total hip arthroplasty 12/24/2014  . Pre-operative cardiovascular examination 11/28/2014  . Nausea with vomiting 07/29/2013  . UTI (urinary tract infection) 07/29/2013  . Bradycardia 07/29/2013  . Status post lumbar laminectomy 07/29/2013  . Syncope 07/25/2013  . Ejection fraction   . Otitis externa 12/16/2011  . Neuropathic pain of lower extremity 12/16/2011  . Nerve entrapment syndrome of lower extremity   . Hyponatremia 06/21/2011  . Insomnia 06/21/2011  . Peripheral vascular disease, unspecified   . Coronary  atherosclerosis of native coronary artery   . Carotid artery disease ([place])   . HYPERLIPIDEMIA-MIXED 05/23/2009  . HYPERTENSION, UNSPECIFIED 05/23/2009   Past Medical History:  Diagnosis Date  . Anxiety   . Arthritis   . Carotid artery disease ([place])    with an occluded right vertebral artery, otherwise nonobstructive carotid artery disease  . Coronary atherosclerosis of native coronary artery    Status post stent to right coronary 2004, catheterization 2005 nonobstructive CAD, normal LV function, Cardiolite study 2008 negative for ischemia. Residual moderate ostial RCA disease. Stress testing 2012 negative for ischemia ejection fraction 78%  . Cough   . Depression    takes Cymbalta daily  .  Diverticulosis   . GERD (gastroesophageal reflux disease)    takes Omeprazole daily  . History of blood transfusion    no abnormal reaction noted  . History of bronchitis    > 58yrs ago   . Joint pain   . Macular degeneration    dry  . Nerve entrapment syndrome of lower extremity     status post prior back surgery and weakness in left leg  . Other and unspecified hyperlipidemia    takes Pravastatin daily  . Peripheral neuropathy ([place])   . Peripheral vascular disease, unspecified    Minimal internal carotid artery disease  . Seizures ([place])    takes Tegretol daily;has only had 1 seizure and that was several yrs ago.  Marland Kitchen Unspecified essential hypertension    takes Lisinopril,Metoprolol,and Imdur daily    Family History  Problem Relation Age of Onset  . Cancer Mother   . Diabetes Mother   . Kidney disease Sister   . Hypertension Other   . Diabetes Other     Past Surgical History:  Procedure Laterality Date  . BACK SURGERY  10/14  . CARDIAC CATHETERIZATION     nonobstructive coronary artery disease  . CATARACT EXTRACTION    . COLONOSCOPY    . CORONARY ANGIOPLASTY  [id number]  . KIDNEY SURGERY  2010   1/4 of right kidney removed [facility]  . KNEE ARTHROPLASTY Left 2004  . PARTIAL HYSTERECTOMY    . TOTAL HIP ARTHROPLASTY Left 12/24/2014   Procedure: TOTAL HIP ARTHROPLASTY;  Surgeon: Garald Balding, MD;  Location: [facility];  Service: Orthopedics;  Laterality: Left;  . TOTAL HIP ARTHROPLASTY Right 07/08/2015  . TOTAL HIP ARTHROPLASTY Right 07/08/2015   Procedure: TOTAL HIP ARTHROPLASTY;  Surgeon: Garald Balding, MD;  Location: [facility];  Service: Orthopedics;  Laterality: Right;   Social History   Occupational History  . Not on file.   Social History Main Topics  . Smoking status: Never Smoker  . Smokeless tobacco: Never Used  . Alcohol use No  . Drug use: No  . Sexual activity: Not Currently    Birth control/ protection: Surgical

## 2016-08-25 ENCOUNTER — Ambulatory Visit (INDEPENDENT_AMBULATORY_CARE_PROVIDER_SITE_OTHER): Payer: Medicare Other | Admitting: Orthopaedic Surgery

## 2016-08-25 VITALS — BP 137/63 | HR 56 | Resp 14 | Ht 62.0 in | Wt 184.0 lb

## 2016-08-25 DIAGNOSIS — Z96643 Presence of artificial hip joint, bilateral: Secondary | ICD-10-CM | POA: Diagnosis not present

## 2016-08-25 DIAGNOSIS — I2510 Atherosclerotic heart disease of native coronary artery without angina pectoris: Secondary | ICD-10-CM | POA: Diagnosis not present

## 2016-08-25 DIAGNOSIS — S52572D Other intraarticular fracture of lower end of left radius, subsequent encounter for closed fracture with routine healing: Secondary | ICD-10-CM

## 2016-08-25 MED ORDER — METHYLPREDNISOLONE ACETATE 40 MG/ML IJ SUSP
80.0000 mg | INTRAMUSCULAR | Status: AC | PRN
  Administered 2016-08-25: 80 mg

## 2016-08-25 MED ORDER — LIDOCAINE HCL 1 % IJ SOLN
3.0000 mL | INTRAMUSCULAR | Status: AC | PRN
  Administered 2016-08-25: 3 mL

## 2016-08-25 NOTE — Progress Notes (Signed)
   Procedure Note  Patient: Carrie Gillespie             Date of Birth: 12-05-1919           MRN: [id number]             Visit Date: 08/25/2016  Procedures: Visit Diagnoses: Status post bilateral total hip replacement - Plan: Large Joint Injection/Arthrocentesis  Other closed intra-articular fracture of distal end of left radius with routine healing, subsequent encounter  Large Joint Inj Date/Time: 08/25/2016 1:41 PM Performed by: Garald Balding Authorized by: Garald Balding   Consent Given by:  Patient Timeout: prior to procedure the correct patient, procedure, and site was verified   Indications:  Pain Location:  Hip Site:  R greater trochanter Prep: patient was prepped and draped in usual sterile fashion   Needle Size:  22 G Needle Length:  3.5 inches Approach:  Lateral Ultrasound Guidance: No   Fluoroscopic Guidance: No   Arthrogram: No   Medications:  3 mL lidocaine 1 %; 80 mg methylPREDNISolone acetate 40 MG/ML Aspiration Attempted: No   Patient tolerance:  Patient tolerated the procedure well with no immediate complications    Mrs. Sloop is accompanied by her daughter for follow-up evaluation of the bilateral hip replacements and, more recently, the fracture of her left wrist. She is 2 months status post nondisplaced intra-articular fracture of the left distal radius. She still having some discomfort and frequently will wear the volar wrist splint. Eyes any numbness or tingling or significant loss of motion. She has not had any therapy as she lives alone.  She also was had bilateral hip replacements and does relatively well although she has an issue with her balance. She actually lost her balance 2 months ago when she fell and fractured her distal radius. She is being followed by Dr. Pleas Koch for her medical issues.  She has had some persistent pain along the lateral aspect of her right hip in the area of the greater trochanter. She denies any groin pain. She doesn't  have any appreciable pain on the left side. She she also has had chronic problems with her back with a prior surgery.  On examination she had painless range of motion of both hips with internal and external rotation. She does use a rolling walker because of her "balance" she does have some local tenderness directly over the greater trochanter of her right hip which I suspect represents a bursitis or IT band tendinitis and I think it's worth injecting that today.  She is 2 months status post wrist fracture as previously identified. She does have full pronation supination flexion and extension although she has a little bit of pain on the extremes of pronation and supination along the volar aspect of her wrist. There is no pain over the distal radius or ulna dorsally she has a good grip good release and neurovascular exam is intact  I think the ideal situation would be to send her to physical therapy for lower and upper body strengthening and also to help her balance but that's an issue with with transportation as she doesn't drive and her family members close by are still employed and working full time. I think she is fine to continue wearing the splint on a when necessary basis. She had films several weeks ago that revealed a healing of the intra-articular fracture in excellent position.  I will plan to see her back in 6 months

## 2016-08-25 NOTE — Progress Notes (Signed)
FX of Left distal radius 06/18/16. (9 weeks)  Slight swelling and tenderness on top of hand, and also on side of wrist.  Right Leg pain today.  Pt ambulates with walker, her balance is "off".  PCP: Dr Judd Lien

## 2016-08-27 DIAGNOSIS — I10 Essential (primary) hypertension: Secondary | ICD-10-CM | POA: Diagnosis not present

## 2016-08-27 DIAGNOSIS — H6690 Otitis media, unspecified, unspecified ear: Secondary | ICD-10-CM | POA: Diagnosis not present

## 2016-08-29 DIAGNOSIS — I10 Essential (primary) hypertension: Secondary | ICD-10-CM | POA: Diagnosis not present

## 2016-08-29 DIAGNOSIS — H9202 Otalgia, left ear: Secondary | ICD-10-CM | POA: Diagnosis not present

## 2016-08-29 DIAGNOSIS — I119 Hypertensive heart disease without heart failure: Secondary | ICD-10-CM | POA: Diagnosis not present

## 2016-08-29 DIAGNOSIS — Z79899 Other long term (current) drug therapy: Secondary | ICD-10-CM | POA: Diagnosis not present

## 2016-08-29 DIAGNOSIS — E7800 Pure hypercholesterolemia, unspecified: Secondary | ICD-10-CM | POA: Diagnosis not present

## 2016-08-29 DIAGNOSIS — K0889 Other specified disorders of teeth and supporting structures: Secondary | ICD-10-CM | POA: Diagnosis not present

## 2016-08-29 DIAGNOSIS — Z8249 Family history of ischemic heart disease and other diseases of the circulatory system: Secondary | ICD-10-CM | POA: Diagnosis not present

## 2016-09-06 DIAGNOSIS — E782 Mixed hyperlipidemia: Secondary | ICD-10-CM | POA: Diagnosis not present

## 2016-09-06 DIAGNOSIS — N182 Chronic kidney disease, stage 2 (mild): Secondary | ICD-10-CM | POA: Diagnosis not present

## 2016-09-06 DIAGNOSIS — I10 Essential (primary) hypertension: Secondary | ICD-10-CM | POA: Diagnosis not present

## 2016-09-06 DIAGNOSIS — E559 Vitamin D deficiency, unspecified: Secondary | ICD-10-CM | POA: Diagnosis not present

## 2016-09-06 DIAGNOSIS — E871 Hypo-osmolality and hyponatremia: Secondary | ICD-10-CM | POA: Diagnosis not present

## 2016-09-06 DIAGNOSIS — K219 Gastro-esophageal reflux disease without esophagitis: Secondary | ICD-10-CM | POA: Diagnosis not present

## 2016-09-09 DIAGNOSIS — I10 Essential (primary) hypertension: Secondary | ICD-10-CM | POA: Diagnosis not present

## 2016-09-09 DIAGNOSIS — E782 Mixed hyperlipidemia: Secondary | ICD-10-CM | POA: Diagnosis not present

## 2016-09-09 DIAGNOSIS — M1611 Unilateral primary osteoarthritis, right hip: Secondary | ICD-10-CM | POA: Diagnosis not present

## 2016-09-09 DIAGNOSIS — N182 Chronic kidney disease, stage 2 (mild): Secondary | ICD-10-CM | POA: Diagnosis not present

## 2016-09-09 DIAGNOSIS — I209 Angina pectoris, unspecified: Secondary | ICD-10-CM | POA: Diagnosis not present

## 2016-09-09 DIAGNOSIS — G40802 Other epilepsy, not intractable, without status epilepticus: Secondary | ICD-10-CM | POA: Diagnosis not present

## 2016-12-31 ENCOUNTER — Encounter: Payer: Self-pay | Admitting: Cardiology

## 2016-12-31 ENCOUNTER — Ambulatory Visit (INDEPENDENT_AMBULATORY_CARE_PROVIDER_SITE_OTHER): Payer: Medicare Other | Admitting: Cardiology

## 2016-12-31 VITALS — BP 149/65 | HR 55 | Ht 62.0 in | Wt 185.8 lb

## 2016-12-31 DIAGNOSIS — R600 Localized edema: Secondary | ICD-10-CM | POA: Diagnosis not present

## 2016-12-31 DIAGNOSIS — I2510 Atherosclerotic heart disease of native coronary artery without angina pectoris: Secondary | ICD-10-CM | POA: Diagnosis not present

## 2016-12-31 DIAGNOSIS — I10 Essential (primary) hypertension: Secondary | ICD-10-CM | POA: Diagnosis not present

## 2016-12-31 DIAGNOSIS — E782 Mixed hyperlipidemia: Secondary | ICD-10-CM

## 2016-12-31 MED ORDER — AMLODIPINE BESYLATE 10 MG PO TABS: 10.0000 mg | ORAL_TABLET | Freq: Every day | ORAL | 1 refills | Status: DC

## 2016-12-31 MED ORDER — FUROSEMIDE 20 MG PO TABS: ORAL_TABLET | ORAL | 3 refills | Status: DC

## 2016-12-31 NOTE — Patient Instructions (Signed)
Your physician recommends that you schedule a follow-up appointment in: [place] has recommended you make the following change in your medication:   INCREASE AMLODIPINE 10 MG DAILY  Thank you for choosing [facility]!!

## 2016-12-31 NOTE — Progress Notes (Signed)
Clinical Summary [name] is a 83 y.o.female seen today for follow up of the following medical problems.    1. CAD - history of stent to RCA in 2004. Nuclear stress negative for ischemia in 2008 and 2012 - echo 07/2013 LVEF 65-70%, - 02/2016 lexiscan without significant ischemia    - denies any chest pain. Can have some shoulder/neck pain. This can respond to nitroglycerin per her report.   2. HTN - compliant with meds   3. LE edema - reports occasional bilateral LE edema. Overall unchanged.  - difficult to get on compression stockings   4. Hyperlipidemia - 01/2016 TC 213 TG 225 HDL 47 LDL 121 - upcoming labs with pcp   Past Medical History:  Diagnosis Date  . Anxiety   . Arthritis   . Carotid artery disease ([place])    with an occluded right vertebral artery, otherwise nonobstructive carotid artery disease  . Coronary atherosclerosis of native coronary artery    Status post stent to right coronary 2004, catheterization 2005 nonobstructive CAD, normal LV function, Cardiolite study 2008 negative for ischemia. Residual moderate ostial RCA disease. Stress testing 2012 negative for ischemia ejection fraction 78%  . Cough   . Depression    takes Cymbalta daily  . Diverticulosis   . GERD (gastroesophageal reflux disease)    takes Omeprazole daily  . History of blood transfusion    no abnormal reaction noted  . History of bronchitis    > 57yrs ago   . Joint pain   . Macular degeneration    dry  . Nerve entrapment syndrome of lower extremity     status post prior back surgery and weakness in left leg  . Other and unspecified hyperlipidemia    takes Pravastatin daily  . Peripheral neuropathy ([place])   . Peripheral vascular disease, unspecified    Minimal internal carotid artery disease  . Seizures ([place])    takes Tegretol daily;has only had 1 seizure and that was several yrs ago.  Marland Kitchen Unspecified essential hypertension    takes Lisinopril,Metoprolol,and Imdur  daily     Allergies  Allergen Reactions  . Latex Itching and Rash     Current Outpatient Prescriptions  Medication Sig Dispense Refill  . amLODipine (NORVASC) 5 MG tablet Take 1 tablet (5 mg total) by mouth daily. 90 tablet 3  . aspirin EC 81 MG tablet Take 81 mg by mouth daily.    . carbamazepine (TEGRETOL XR) 200 MG 12 hr tablet Take 200 mg by mouth daily.      . Cholecalciferol (VITAMIN D-1000 MAX ST) 1000 units tablet Take by mouth.    . diphenhydrAMINE (BENADRYL) 25 MG tablet Take 25 mg by mouth every 6 (six) hours as needed for allergies.    . furosemide (LASIX) 20 MG tablet TAKE 1 TAB DAILY AS NEEDED FOR SWELLING 90 tablet 3  . ibuprofen (ADVIL,MOTRIN) 600 MG tablet Take 600 mg by mouth 2 (two) times daily as needed.    . isosorbide mononitrate (IMDUR) 120 MG 24 hr tablet Take 120 mg by mouth daily.    Marland Kitchen lisinopril (PRINIVIL,ZESTRIL) 10 MG tablet Take 10 mg by mouth daily.     . metoprolol (LOPRESSOR) 50 MG tablet Take 0.5 tablets (25 mg total) by mouth 2 (two) times daily.    . Multiple Vitamin (THERA) TABS Take by mouth.    . nitroGLYCERIN (NITROSTAT) 0.4 MG SL tablet Place 1 tablet (0.4 mg total) under the tongue every 5 (five) minutes  as needed for chest pain. 25 tablet 3  . omeprazole (PRILOSEC) 20 MG capsule Take 20 mg by mouth daily. OTC    . pravastatin (PRAVACHOL) 80 MG tablet Take 1 tablet (80 mg total) by mouth every evening. 90 tablet 3  . traMADol (ULTRAM) 50 MG tablet Take by mouth.     No current facility-administered medications for this visit.      Past Surgical History:  Procedure Laterality Date  . BACK SURGERY  10/14  . CARDIAC CATHETERIZATION     nonobstructive coronary artery disease  . CATARACT EXTRACTION    . COLONOSCOPY    . CORONARY ANGIOPLASTY  [id number]  . KIDNEY SURGERY  2010   1/4 of right kidney removed [facility]  . KNEE ARTHROPLASTY Left 2004  . PARTIAL HYSTERECTOMY    . TOTAL HIP ARTHROPLASTY Left 12/24/2014   Procedure: TOTAL HIP  ARTHROPLASTY;  Surgeon: Garald Balding, MD;  Location: [facility];  Service: Orthopedics;  Laterality: Left;  . TOTAL HIP ARTHROPLASTY Right 07/08/2015  . TOTAL HIP ARTHROPLASTY Right 07/08/2015   Procedure: TOTAL HIP ARTHROPLASTY;  Surgeon: Garald Balding, MD;  Location: [facility];  Service: Orthopedics;  Laterality: Right;     Allergies  Allergen Reactions  . Latex Itching and Rash      Family History  Problem Relation Age of Onset  . Cancer Mother   . Diabetes Mother   . Kidney disease Sister   . Hypertension Other   . Diabetes Other      Social History [name] reports that she has never smoked. She has never used smokeless tobacco. [name] reports that she does not drink alcohol.   Review of Systems CONSTITUTIONAL: No weight loss, fever, chills, weakness or fatigue.  HEENT: Eyes: No visual loss, blurred vision, double vision or yellow sclerae.No hearing loss, sneezing, congestion, runny nose or sore throat.  SKIN: No rash or itching.  CARDIOVASCULAR: per hpi RESPIRATORY: No shortness of breath, cough or sputum.  GASTROINTESTINAL: No anorexia, nausea, vomiting or diarrhea. No abdominal pain or blood.  GENITOURINARY: No burning on urination, no polyuria NEUROLOGICAL: No headache, dizziness, syncope, paralysis, ataxia, numbness or tingling in the extremities. No change in bowel or bladder control.  MUSCULOSKELETAL: No muscle, back pain, joint pain or stiffness.  LYMPHATICS: No enlarged nodes. No history of splenectomy.  PSYCHIATRIC: No history of depression or anxiety.  ENDOCRINOLOGIC: No reports of sweating, cold or heat intolerance. No polyuria or polydipsia.  Marland Kitchen   Physical Examination Vitals:   12/31/16 0955  BP: (!) 149/65  Pulse: (!) 55   Vitals:   12/31/16 0955  Weight: 185 lb 12.8 oz (84.3 kg)  Height: 5\' 2"  (1.575 m)    Gen: resting comfortably, no acute distress HEENT: no scleral icterus, pupils equal round and reactive, no palptable cervical  adenopathy,  CV: RRR, no m/r/g, no jvd Resp: Clear to auscultation bilaterally GI: abdomen is soft, non-tender, non-distended, normal bowel sounds, no hepatosplenomegaly MSK: extremities are warm, no edema.  Skin: warm, no rash Neuro:  no focal deficits Psych: appropriate affect   Diagnostic Studies 07/2013 Carotid US Summary: Right: moderate intimal wall thickening CCA. Mild mixed plaque origin ICA. Left: mild sof tplaque distal CCA. Mild calcific plaque origin ICA with acoustic shadowing. Bilateral: 1-395 ICA stenosis. Vertebral artery flow is antegrade. ICA/CCA ratio: R-0.59 L-1.0   02/2016 Lexiscan Nuclear stress  Horizontal ST segment depression after Lexiscan injection. ST segment depression was noted during stress in the II, III, aVF, V5  and V6 leads. Nonspecific finding in setting of severe hypertension  The study is normal. There are no perfusion defects consistent with prior infarction or current ischemia.  This is a low risk study.  The left ventricular ejection fraction is hyperdynamic (>65%).    Assessment and Plan   1. CAD -atypical pain, primarily neck and shoulder though she reports better with NG - lexiscan in 02/2016 no ischemia - we will try increasing norvasc to 10mg  daily as additional antianginal. Given age would be hesitant for cath  2. LE edema - she will continue diuretic, lasix 20mg  prn  3. HTN - elevated in clinic, increasing norvasc as described abov.e   4. Hyperlpidemia - continue statin in setting of CAD   F/u 3 months      Arnoldo Lenis, M.D.

## 2017-01-09 DIAGNOSIS — S92355A Nondisplaced fracture of fifth metatarsal bone, left foot, initial encounter for closed fracture: Secondary | ICD-10-CM | POA: Diagnosis not present

## 2017-01-12 ENCOUNTER — Ambulatory Visit (INDEPENDENT_AMBULATORY_CARE_PROVIDER_SITE_OTHER): Payer: Medicare Other | Admitting: Orthopaedic Surgery

## 2017-01-12 ENCOUNTER — Encounter (INDEPENDENT_AMBULATORY_CARE_PROVIDER_SITE_OTHER): Payer: Self-pay | Admitting: Orthopaedic Surgery

## 2017-01-12 DIAGNOSIS — I2510 Atherosclerotic heart disease of native coronary artery without angina pectoris: Secondary | ICD-10-CM

## 2017-01-12 DIAGNOSIS — M79672 Pain in left foot: Secondary | ICD-10-CM | POA: Diagnosis not present

## 2017-01-12 NOTE — Progress Notes (Signed)
Office Visit Note   Patient: Carrie Gillespie           Date of Birth: Aug 16, 1920           MRN: [id number] Visit Date: 01/12/2017              Requested by: Curlene Labrum, MD [place], [place] [id number] PCP: Curlene Labrum, MD   Assessment & Plan: Visit Diagnoses:  1. Left foot pain   Nondisplaced fracture base of left fifth metatarsal ( non-Jones fracture)  Plan: Equalizer boot, weightbearing as tolerated, office 1 month  Follow-Up Instructions: Return in about 1 month (around 02/11/2017).   Orders:  No orders of the defined types were placed in this encounter.  No orders of the defined types were placed in this encounter.     Procedures: No procedures performed   Clinical Data: No additional findings. Carrie Gillespie had a disc with her x-rays from [facility]. These were reviewed. There is a nondisplaced fracture at the base of the fifth metatarsal consistent with an avulsion fracture. There is no separation of the fracture.  Subjective: Chief Complaint  Patient presents with  . Left Foot - Fracture    Carrie Gillespie is a 83 y o  Female that presents with Left foot fx on Saturday 01/08/17. Went to urgent care Sunday , xr obtained and darco shoe on pt.  Carrie Gillespie is accompanied by her daughter and is here for evaluation of an injury to her left foot. Apparently her foot, went to sleep" and as she was getting up she had an inversion injury. She was seen at a local urgent care facility in [place] with x-rays demonstrating a "metatarsal fracture". She was placed in a wooden shoe and asked to follow-up in the office.  HPI  Review of Systems   Objective: Vital Signs: There were no vitals taken for this visit.  Physical Exam  Ortho Exam left foot with localized tenderness at the base of the fifth metatarsal. Skin intact. Capillary refill to toes. Sensory exam intact. Formerly.  Specialty Comments:  No specialty comments available.  Imaging: No results  found.   PMFS History: Patient Active Problem List   Diagnosis Date Noted  . Primary osteoarthritis of right hip 07/08/2015  . Primary osteoarthritis of left hip 12/24/2014  . S/P total hip arthroplasty 12/24/2014  . Pre-operative cardiovascular examination 11/28/2014  . Nausea with vomiting 07/29/2013  . UTI (urinary tract infection) 07/29/2013  . Bradycardia 07/29/2013  . Status post lumbar laminectomy 07/29/2013  . Syncope 07/25/2013  . Ejection fraction   . Otitis externa 12/16/2011  . Neuropathic pain of lower extremity 12/16/2011  . Nerve entrapment syndrome of lower extremity   . Hyponatremia 06/21/2011  . Insomnia 06/21/2011  . Peripheral vascular disease, unspecified ([place])   . Coronary atherosclerosis of native coronary artery   . Carotid artery disease ([place])   . HYPERLIPIDEMIA-MIXED 05/23/2009  . HTN (hypertension) 05/23/2009   Past Medical History:  Diagnosis Date  . Anxiety   . Arthritis   . Carotid artery disease ([place])    with an occluded right vertebral artery, otherwise nonobstructive carotid artery disease  . Coronary atherosclerosis of native coronary artery    Status post stent to right coronary 2004, catheterization 2005 nonobstructive CAD, normal LV function, Cardiolite study 2008 negative for ischemia. Residual moderate ostial RCA disease. Stress testing 2012 negative for ischemia ejection fraction 78%  . Cough   . Depression    takes Cymbalta  daily  . Diverticulosis   . GERD (gastroesophageal reflux disease)    takes Omeprazole daily  . History of blood transfusion    no abnormal reaction noted  . History of bronchitis    > 50yrs ago   . Joint pain   . Macular degeneration    dry  . Nerve entrapment syndrome of lower extremity     status post prior back surgery and weakness in left leg  . Other and unspecified hyperlipidemia    takes Pravastatin daily  . Peripheral neuropathy   . Peripheral vascular disease, unspecified ([place])    Minimal  internal carotid artery disease  . Seizures ([place])    takes Tegretol daily;has only had 1 seizure and that was several yrs ago.  Marland Kitchen Unspecified essential hypertension    takes Lisinopril,Metoprolol,and Imdur daily    Family History  Problem Relation Age of Onset  . Cancer Mother   . Diabetes Mother   . Kidney disease Sister   . Hypertension Other   . Diabetes Other     Past Surgical History:  Procedure Laterality Date  . BACK SURGERY  10/14  . CARDIAC CATHETERIZATION     nonobstructive coronary artery disease  . CATARACT EXTRACTION    . COLONOSCOPY    . CORONARY ANGIOPLASTY  [id number]  . KIDNEY SURGERY  2010   1/4 of right kidney removed [facility]  . KNEE ARTHROPLASTY Left 2004  . PARTIAL HYSTERECTOMY    . TOTAL HIP ARTHROPLASTY Left 12/24/2014   Procedure: TOTAL HIP ARTHROPLASTY;  Surgeon: Garald Balding, MD;  Location: [facility];  Service: Orthopedics;  Laterality: Left;  . TOTAL HIP ARTHROPLASTY Right 07/08/2015  . TOTAL HIP ARTHROPLASTY Right 07/08/2015   Procedure: TOTAL HIP ARTHROPLASTY;  Surgeon: Garald Balding, MD;  Location: [facility];  Service: Orthopedics;  Laterality: Right;   Social History   Occupational History  . Not on file.   Social History Main Topics  . Smoking status: Never Smoker  . Smokeless tobacco: Never Used  . Alcohol use No  . Drug use: No  . Sexual activity: Not Currently    Birth control/ protection: Surgical

## 2017-01-17 ENCOUNTER — Ambulatory Visit: Payer: Medicare Other | Admitting: Cardiology

## 2017-01-19 ENCOUNTER — Ambulatory Visit (INDEPENDENT_AMBULATORY_CARE_PROVIDER_SITE_OTHER): Payer: Medicare Other | Admitting: Orthopaedic Surgery

## 2017-01-26 ENCOUNTER — Ambulatory Visit (INDEPENDENT_AMBULATORY_CARE_PROVIDER_SITE_OTHER): Payer: Medicare Other

## 2017-01-26 ENCOUNTER — Ambulatory Visit (INDEPENDENT_AMBULATORY_CARE_PROVIDER_SITE_OTHER): Payer: Medicare Other | Admitting: Orthopaedic Surgery

## 2017-01-26 DIAGNOSIS — M79672 Pain in left foot: Secondary | ICD-10-CM | POA: Diagnosis not present

## 2017-01-26 DIAGNOSIS — I2510 Atherosclerotic heart disease of native coronary artery without angina pectoris: Secondary | ICD-10-CM

## 2017-01-26 NOTE — Progress Notes (Signed)
Office Visit Note   Patient: Carrie Gillespie           Date of Birth: Feb 02, 1920           MRN: [id number] Visit Date: 01/26/2017              Requested by: Curlene Labrum, MD [address] [place], [place] [id number] PCP: Curlene Labrum, MD   Assessment & Plan: Visit Diagnoses:  1. Left foot pain    3 weeks status post nondisplaced avulsion fracture at the base of the left fifth metatarsal-doing well in equalizer boot Plan: Films today reveal good position of the fracture. Continue with equalizer boot and return to the office in 3 weeks. Weightbearing as tolerated  Follow-Up Instructions: No Follow-up on file.   Orders:  No orders of the defined types were placed in this encounter.  No orders of the defined types were placed in this encounter.     Procedures: No procedures performed   Clinical Data: No additional findings.   Subjective: No chief complaint on file. Carrie Gillespie relates that she's doing relatively well in terms of pain but finds that equalizer boot to be a nuisance.  HPI  Review of Systems   Objective: Vital Signs: There were no vitals taken for this visit.  Physical Exam  Ortho Exam still some tenderness at the base of the left fifth metatarsal with minimal swelling. Skin intact neurovascular exam intact.  Specialty Comments:  No specialty comments available.  Imaging: No results found.   PMFS History: Patient Active Problem List   Diagnosis Date Noted  . Primary osteoarthritis of right hip 07/08/2015  . Primary osteoarthritis of left hip 12/24/2014  . S/P total hip arthroplasty 12/24/2014  . Pre-operative cardiovascular examination 11/28/2014  . Nausea with vomiting 07/29/2013  . UTI (urinary tract infection) 07/29/2013  . Bradycardia 07/29/2013  . Status post lumbar laminectomy 07/29/2013  . Syncope 07/25/2013  . Ejection fraction   . Otitis externa 12/16/2011  . Neuropathic pain of lower extremity 12/16/2011  . Nerve entrapment  syndrome of lower extremity   . Hyponatremia 06/21/2011  . Insomnia 06/21/2011  . Peripheral vascular disease, unspecified ([place])   . Coronary atherosclerosis of native coronary artery   . Carotid artery disease ([place])   . HYPERLIPIDEMIA-MIXED 05/23/2009  . HTN (hypertension) 05/23/2009   Past Medical History:  Diagnosis Date  . Anxiety   . Arthritis   . Carotid artery disease ([place])    with an occluded right vertebral artery, otherwise nonobstructive carotid artery disease  . Coronary atherosclerosis of native coronary artery    Status post stent to right coronary 2004, catheterization 2005 nonobstructive CAD, normal LV function, Cardiolite study 2008 negative for ischemia. Residual moderate ostial RCA disease. Stress testing 2012 negative for ischemia ejection fraction 78%  . Cough   . Depression    takes Cymbalta daily  . Diverticulosis   . GERD (gastroesophageal reflux disease)    takes Omeprazole daily  . History of blood transfusion    no abnormal reaction noted  . History of bronchitis    > 17yrs ago   . Joint pain   . Macular degeneration    dry  . Nerve entrapment syndrome of lower extremity     status post prior back surgery and weakness in left leg  . Other and unspecified hyperlipidemia    takes Pravastatin daily  . Peripheral neuropathy   . Peripheral vascular disease, unspecified ([place])    Minimal internal  carotid artery disease  . Seizures ([place])    takes Tegretol daily;has only had 1 seizure and that was several yrs ago.  Marland Kitchen Unspecified essential hypertension    takes Lisinopril,Metoprolol,and Imdur daily    Family History  Problem Relation Age of Onset  . Cancer Mother   . Diabetes Mother   . Kidney disease Sister   . Hypertension Other   . Diabetes Other     Past Surgical History:  Procedure Laterality Date  . BACK SURGERY  10/14  . CARDIAC CATHETERIZATION     nonobstructive coronary artery disease  . CATARACT EXTRACTION    . COLONOSCOPY    .  CORONARY ANGIOPLASTY  [id number]  . KIDNEY SURGERY  2010   1/4 of right kidney removed [facility]  . KNEE ARTHROPLASTY Left 2004  . PARTIAL HYSTERECTOMY    . TOTAL HIP ARTHROPLASTY Left 12/24/2014   Procedure: TOTAL HIP ARTHROPLASTY;  Surgeon: Garald Balding, MD;  Location: [facility];  Service: Orthopedics;  Laterality: Left;  . TOTAL HIP ARTHROPLASTY Right 07/08/2015  . TOTAL HIP ARTHROPLASTY Right 07/08/2015   Procedure: TOTAL HIP ARTHROPLASTY;  Surgeon: Garald Balding, MD;  Location: [facility];  Service: Orthopedics;  Laterality: Right;   Social History   Occupational History  . Not on file.   Social History Main Topics  . Smoking status: Never Smoker  . Smokeless tobacco: Never Used  . Alcohol use No  . Drug use: No  . Sexual activity: Not Currently    Birth control/ protection: Surgical     Garald Balding, MD   Note - This record has been created using [employer].  Chart creation errors have been sought, but may not always  have been located. Such creation errors do not reflect on  the standard of medical care.

## 2017-02-09 ENCOUNTER — Ambulatory Visit (INDEPENDENT_AMBULATORY_CARE_PROVIDER_SITE_OTHER): Payer: Medicare Other

## 2017-02-09 ENCOUNTER — Ambulatory Visit (INDEPENDENT_AMBULATORY_CARE_PROVIDER_SITE_OTHER): Payer: Medicare Other | Admitting: Orthopaedic Surgery

## 2017-02-09 DIAGNOSIS — M79672 Pain in left foot: Secondary | ICD-10-CM | POA: Diagnosis not present

## 2017-02-09 NOTE — Progress Notes (Signed)
Office Visit Note   Patient: Carrie Gillespie           Date of Birth: 1920-01-09           MRN: [id number] Visit Date: 02/09/2017              Requested by: Curlene Labrum, MD [place], [place] [address] PCP: Curlene Labrum, MD   Assessment & Plan: Visit Diagnoses:  1. Left foot pain   Up proximally 4 weeks status post fracture the base of the left fifth metatarsal. Presently asymptomatic in the equalizer boot  Plan: Wean from the boot, continue with walker ambulation, return in 2 weeks  Follow-Up Instructions: Return in about 2 weeks (around 02/23/2017).   Orders:  No orders of the defined types were placed in this encounter.  No orders of the defined types were placed in this encounter.     Procedures: No procedures performed   Clinical Data: No additional findings.   Subjective: No chief complaint on file. Carrie Gillespie is accompanied by her son. She is approximately 4 weeks status post injury resulting in of fracture at the base of the left fifth metatarsal. This is more of an avulsion type fracture than a true Jones fracture. She's been immobilized in an equalizer boot present relates not having any pain.  HPI  Review of Systems   Objective: Vital Signs: There were no vitals taken for this visit.  Physical Exam  Ortho Exam minimal discomfort base of the left fifth metatarsal to palpation. Neurovascular exam intact. Skin intact.  Specialty Comments:  No specialty comments available.  Imaging: No results found.   PMFS History: Patient Active Problem List   Diagnosis Date Noted  . Primary osteoarthritis of right hip 07/08/2015  . Primary osteoarthritis of left hip 12/24/2014  . S/P total hip arthroplasty 12/24/2014  . Pre-operative cardiovascular examination 11/28/2014  . Nausea with vomiting 07/29/2013  . UTI (urinary tract infection) 07/29/2013  . Bradycardia 07/29/2013  . Status post lumbar laminectomy 07/29/2013  . Syncope 07/25/2013    . Ejection fraction   . Otitis externa 12/16/2011  . Neuropathic pain of lower extremity 12/16/2011  . Nerve entrapment syndrome of lower extremity   . Hyponatremia 06/21/2011  . Insomnia 06/21/2011  . Peripheral vascular disease, unspecified ([place])   . Coronary atherosclerosis of native coronary artery   . Carotid artery disease ([place])   . HYPERLIPIDEMIA-MIXED 05/23/2009  . HTN (hypertension) 05/23/2009   Past Medical History:  Diagnosis Date  . Anxiety   . Arthritis   . Carotid artery disease ([place])    with an occluded right vertebral artery, otherwise nonobstructive carotid artery disease  . Coronary atherosclerosis of native coronary artery    Status post stent to right coronary 2004, catheterization 2005 nonobstructive CAD, normal LV function, Cardiolite study 2008 negative for ischemia. Residual moderate ostial RCA disease. Stress testing 2012 negative for ischemia ejection fraction 78%  . Cough   . Depression    takes Cymbalta daily  . Diverticulosis   . GERD (gastroesophageal reflux disease)    takes Omeprazole daily  . History of blood transfusion    no abnormal reaction noted  . History of bronchitis    > 40yrs ago   . Joint pain   . Macular degeneration    dry  . Nerve entrapment syndrome of lower extremity     status post prior back surgery and weakness in left leg  . Other and unspecified hyperlipidemia  takes Pravastatin daily  . Peripheral neuropathy   . Peripheral vascular disease, unspecified ([place])    Minimal internal carotid artery disease  . Seizures ([place])    takes Tegretol daily;has only had 1 seizure and that was several yrs ago.  Marland Kitchen Unspecified essential hypertension    takes Lisinopril,Metoprolol,and Imdur daily    Family History  Problem Relation Age of Onset  . Cancer Mother   . Diabetes Mother   . Kidney disease Sister   . Hypertension Other   . Diabetes Other     Past Surgical History:  Procedure Laterality Date  . BACK SURGERY  10/14   . CARDIAC CATHETERIZATION     nonobstructive coronary artery disease  . CATARACT EXTRACTION    . COLONOSCOPY    . CORONARY ANGIOPLASTY  [id number]  . KIDNEY SURGERY  2010   1/4 of right kidney removed [facility]  . KNEE ARTHROPLASTY Left 2004  . PARTIAL HYSTERECTOMY    . TOTAL HIP ARTHROPLASTY Left 12/24/2014   Procedure: TOTAL HIP ARTHROPLASTY;  Surgeon: Garald Balding, MD;  Location: [facility];  Service: Orthopedics;  Laterality: Left;  . TOTAL HIP ARTHROPLASTY Right 07/08/2015  . TOTAL HIP ARTHROPLASTY Right 07/08/2015   Procedure: TOTAL HIP ARTHROPLASTY;  Surgeon: Garald Balding, MD;  Location: [facility];  Service: Orthopedics;  Laterality: Right;   Social History   Occupational History  . Not on file.   Social History Main Topics  . Smoking status: Never Smoker  . Smokeless tobacco: Never Used  . Alcohol use No  . Drug use: No  . Sexual activity: Not Currently    Birth control/ protection: Surgical     Garald Balding, MD   Note - This record has been created using [employer].  Chart creation errors have been sought, but may not always  have been located. Such creation errors do not reflect on  the standard of medical care.

## 2017-02-23 ENCOUNTER — Ambulatory Visit (INDEPENDENT_AMBULATORY_CARE_PROVIDER_SITE_OTHER): Payer: Medicare Other | Admitting: Orthopaedic Surgery

## 2017-02-23 DIAGNOSIS — M79672 Pain in left foot: Secondary | ICD-10-CM

## 2017-02-23 DIAGNOSIS — I2510 Atherosclerotic heart disease of native coronary artery without angina pectoris: Secondary | ICD-10-CM

## 2017-02-23 NOTE — Progress Notes (Signed)
Office Visit Note   Patient: Carrie Gillespie           Date of Birth: Nov 11, 1919           MRN: [id number] Visit Date: 02/23/2017              Requested by: Curlene Labrum, MD [place], [place] [address] PCP: Curlene Labrum, MD   Assessment & Plan: Visit Diagnoses:  1. Left foot pain   Recent fall this past weekend without an apparent injury to the left foot. Healing fracture at the base of the fifth metatarsal.  Plan: Comfortable shoe, office as needed  Follow-Up Instructions: Return if symptoms worsen or fail to improve.   Orders:  No orders of the defined types were placed in this encounter.  No orders of the defined types were placed in this encounter.     Procedures: No procedures performed   Clinical Data: No additional findings.   Subjective: No chief complaint on file. Carrie Gillespie has been followed for the fracture at the base of the fifth metatarsal. She still wearing a wooden shoe for the past several weeks and note that she's doing just fine. She does use a rolling walker for her balance. In fact, she fell again on Sunday but doesn't feel like she has reinjured her left foot. She has chronic swelling of both of her lower extremities and wears support stockings. She also feels at this point she could probably wear regular shoe on her left foot rather than the wooden shoe  HPI  Review of Systems   Objective: Vital Signs: There were no vitals taken for this visit.  Physical Exam  Ortho Exam support stocking in place left lower extremity and was not removed. No tenderness at the base the fifth metatarsal. Some midfoot discomfort which is relatively mild and probably related to midfoot arthritis. Toes are not swollen. Good sensibility. Able to walk without a limp using her rolling walker. no pain about the ankle at either the lateral or medial malleolus or posterior to either.  Specialty Comments:  No specialty comments available.  Imaging: No  results found.   PMFS History: Patient Active Problem List   Diagnosis Date Noted  . Primary osteoarthritis of right hip 07/08/2015  . Primary osteoarthritis of left hip 12/24/2014  . S/P total hip arthroplasty 12/24/2014  . Pre-operative cardiovascular examination 11/28/2014  . Nausea with vomiting 07/29/2013  . UTI (urinary tract infection) 07/29/2013  . Bradycardia 07/29/2013  . Status post lumbar laminectomy 07/29/2013  . Syncope 07/25/2013  . Ejection fraction   . Otitis externa 12/16/2011  . Neuropathic pain of lower extremity 12/16/2011  . Nerve entrapment syndrome of lower extremity   . Hyponatremia 06/21/2011  . Insomnia 06/21/2011  . Peripheral vascular disease, unspecified ([place])   . Coronary atherosclerosis of native coronary artery   . Carotid artery disease ([place])   . HYPERLIPIDEMIA-MIXED 05/23/2009  . HTN (hypertension) 05/23/2009   Past Medical History:  Diagnosis Date  . Anxiety   . Arthritis   . Carotid artery disease ([place])    with an occluded right vertebral artery, otherwise nonobstructive carotid artery disease  . Coronary atherosclerosis of native coronary artery    Status post stent to right coronary 2004, catheterization 2005 nonobstructive CAD, normal LV function, Cardiolite study 2008 negative for ischemia. Residual moderate ostial RCA disease. Stress testing 2012 negative for ischemia ejection fraction 78%  . Cough   . Depression  takes Cymbalta daily  . Diverticulosis   . GERD (gastroesophageal reflux disease)    takes Omeprazole daily  . History of blood transfusion    no abnormal reaction noted  . History of bronchitis    > 55yrs ago   . Joint pain   . Macular degeneration    dry  . Nerve entrapment syndrome of lower extremity     status post prior back surgery and weakness in left leg  . Other and unspecified hyperlipidemia    takes Pravastatin daily  . Peripheral neuropathy   . Peripheral vascular disease, unspecified ([place])     Minimal internal carotid artery disease  . Seizures ([place])    takes Tegretol daily;has only had 1 seizure and that was several yrs ago.  Marland Kitchen Unspecified essential hypertension    takes Lisinopril,Metoprolol,and Imdur daily    Family History  Problem Relation Age of Onset  . Cancer Mother   . Diabetes Mother   . Kidney disease Sister   . Hypertension Other   . Diabetes Other     Past Surgical History:  Procedure Laterality Date  . BACK SURGERY  10/14  . CARDIAC CATHETERIZATION     nonobstructive coronary artery disease  . CATARACT EXTRACTION    . COLONOSCOPY    . CORONARY ANGIOPLASTY  [id number]  . KIDNEY SURGERY  2010   1/4 of right kidney removed [facility]  . KNEE ARTHROPLASTY Left 2004  . PARTIAL HYSTERECTOMY    . TOTAL HIP ARTHROPLASTY Left 12/24/2014   Procedure: TOTAL HIP ARTHROPLASTY;  Surgeon: Garald Balding, MD;  Location: [facility];  Service: Orthopedics;  Laterality: Left;  . TOTAL HIP ARTHROPLASTY Right 07/08/2015  . TOTAL HIP ARTHROPLASTY Right 07/08/2015   Procedure: TOTAL HIP ARTHROPLASTY;  Surgeon: Garald Balding, MD;  Location: [facility];  Service: Orthopedics;  Laterality: Right;   Social History   Occupational History  . Not on file.   Social History Main Topics  . Smoking status: Never Smoker  . Smokeless tobacco: Never Used  . Alcohol use No  . Drug use: No  . Sexual activity: Not Currently    Birth control/ protection: Surgical     Garald Balding, MD   Note - This record has been created using [employer].  Chart creation errors have been sought, but may not always  have been located. Such creation errors do not reflect on  the standard of medical care.

## 2017-03-04 DIAGNOSIS — R569 Unspecified convulsions: Secondary | ICD-10-CM | POA: Diagnosis not present

## 2017-03-04 DIAGNOSIS — Z79899 Other long term (current) drug therapy: Secondary | ICD-10-CM | POA: Diagnosis not present

## 2017-03-04 DIAGNOSIS — S92352A Displaced fracture of fifth metatarsal bone, left foot, initial encounter for closed fracture: Secondary | ICD-10-CM | POA: Diagnosis not present

## 2017-03-04 DIAGNOSIS — S92355A Nondisplaced fracture of fifth metatarsal bone, left foot, initial encounter for closed fracture: Secondary | ICD-10-CM | POA: Diagnosis not present

## 2017-03-04 DIAGNOSIS — I519 Heart disease, unspecified: Secondary | ICD-10-CM | POA: Diagnosis not present

## 2017-03-04 DIAGNOSIS — I10 Essential (primary) hypertension: Secondary | ICD-10-CM | POA: Diagnosis not present

## 2017-03-04 DIAGNOSIS — X58XXXA Exposure to other specified factors, initial encounter: Secondary | ICD-10-CM | POA: Diagnosis not present

## 2017-03-04 DIAGNOSIS — E7800 Pure hypercholesterolemia, unspecified: Secondary | ICD-10-CM | POA: Diagnosis not present

## 2017-03-09 ENCOUNTER — Ambulatory Visit (INDEPENDENT_AMBULATORY_CARE_PROVIDER_SITE_OTHER): Payer: Medicare Other | Admitting: Orthopaedic Surgery

## 2017-03-09 ENCOUNTER — Encounter (INDEPENDENT_AMBULATORY_CARE_PROVIDER_SITE_OTHER): Payer: Self-pay | Admitting: Orthopaedic Surgery

## 2017-03-09 VITALS — BP 142/70 | HR 60 | Resp 14 | Ht 62.0 in | Wt 185.0 lb

## 2017-03-09 DIAGNOSIS — M79672 Pain in left foot: Secondary | ICD-10-CM

## 2017-03-09 DIAGNOSIS — I2510 Atherosclerotic heart disease of native coronary artery without angina pectoris: Secondary | ICD-10-CM

## 2017-03-09 NOTE — Progress Notes (Signed)
Office Visit Note   Patient: Carrie Gillespie           Date of Birth: July 31, 1920           MRN: [id number] Visit Date: 03/09/2017              Requested by: Curlene Labrum, MD [place], [place] [address] PCP: Curlene Labrum, MD   Assessment & Plan: Visit Diagnoses:  1. 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   Orders:  No orders of the defined types were placed in this encounter.  No orders of the defined types were placed in this encounter.     Procedures: No procedures performed   Clinical Data: No additional findings.   Subjective: Chief Complaint  Patient presents with  . Left Foot - Pain  HPI  Carrie Gillespie was initially diagnosed with an avulsion type of fracture at the base left fifth metatarsal several months ago after an injury. She was initially immobilized in an equalizer boot and then a wooden shoe. She does have a problem with her balance and uses a rolling walker and is had some minor reinjuries to that foot. She continues to have pain to the point of compromise. In fact, because of her pain she will return to the emergency room this weekend films were repeated revealing persistent nonunion at the base the fifth metatarsal .she does live by herself and finds that the foot pain is really compromising  Review of Systems   Objective: Vital Signs: BP (!) 142/70   Pulse 60   Resp 14   Ht 5\' 2"  (1.575 m)   Wt 185 lb (83.9 kg)   BMI 33.84 kg/m   Physical Exam  Ortho Exam  left foot exam with local tenderness directly over the base the fifth metatarsal. Carrie Gillespie does walk with a limp based on her pain at that particular location. Skin otherwise intact. Sensory exam intact  Specialty Comments:  No specialty comments  available.  Imaging: No results found.   PMFS History: Patient Active Problem List   Diagnosis Date Noted  . Primary osteoarthritis of right hip 07/08/2015  . Primary osteoarthritis of left hip 12/24/2014  . S/P total hip arthroplasty 12/24/2014  . Pre-operative cardiovascular examination 11/28/2014  . Nausea with vomiting 07/29/2013  . UTI (urinary tract infection) 07/29/2013  . Bradycardia 07/29/2013  . Status post lumbar laminectomy 07/29/2013  . Syncope 07/25/2013  . Ejection fraction   . Otitis externa 12/16/2011  . Neuropathic pain of lower extremity 12/16/2011  . Nerve entrapment syndrome of lower extremity   . Hyponatremia 06/21/2011  . Insomnia 06/21/2011  . Peripheral vascular disease, unspecified ([place])   . Coronary atherosclerosis of native coronary artery   . Carotid artery disease ([place])   . HYPERLIPIDEMIA-MIXED 05/23/2009  . HTN (hypertension) 05/23/2009   Past Medical History:  Diagnosis Date  . Anxiety   . Arthritis   . Carotid artery disease ([place])    with an occluded right vertebral artery, otherwise nonobstructive carotid artery disease  . Coronary atherosclerosis of native coronary artery    Status post stent to right coronary 2004, catheterization 2005 nonobstructive CAD, normal LV function, Cardiolite study 2008 negative for ischemia. Residual moderate ostial RCA disease. Stress testing 2012 negative for ischemia ejection fraction  78%  . Cough   . Depression    takes Cymbalta daily  . Diverticulosis   . GERD (gastroesophageal reflux disease)    takes Omeprazole daily  . History of blood transfusion    no abnormal reaction noted  . History of bronchitis    > 69yrs ago   . Joint pain   . Macular degeneration    dry  . Nerve entrapment syndrome of lower extremity     status post prior back surgery and weakness in left leg  . Other and unspecified hyperlipidemia    takes Pravastatin daily  . Peripheral neuropathy   . Peripheral vascular disease,  unspecified ([place])    Minimal internal carotid artery disease  . Seizures ([place])    takes Tegretol daily;has only had 1 seizure and that was several yrs ago.  Marland Kitchen Unspecified essential hypertension    takes Lisinopril,Metoprolol,and Imdur daily    Family History  Problem Relation Age of Onset  . Cancer Mother   . Diabetes Mother   . Kidney disease Sister   . Hypertension Other   . Diabetes Other     Past Surgical History:  Procedure Laterality Date  . BACK SURGERY  10/14  . CARDIAC CATHETERIZATION     nonobstructive coronary artery disease  . CATARACT EXTRACTION    . COLONOSCOPY    . CORONARY ANGIOPLASTY  [id number]  . KIDNEY SURGERY  2010   1/4 of right kidney removed [facility]  . KNEE ARTHROPLASTY Left 2004  . PARTIAL HYSTERECTOMY    . TOTAL HIP ARTHROPLASTY Left 12/24/2014   Procedure: TOTAL HIP ARTHROPLASTY;  Surgeon: Garald Balding, MD;  Location: [facility];  Service: Orthopedics;  Laterality: Left;  . TOTAL HIP ARTHROPLASTY Right 07/08/2015  . TOTAL HIP ARTHROPLASTY Right 07/08/2015   Procedure: TOTAL HIP ARTHROPLASTY;  Surgeon: Garald Balding, MD;  Location: [facility];  Service: Orthopedics;  Laterality: Right;   Social History   Occupational History  . Not on file.   Social History Main Topics  . Smoking status: Never Smoker  . Smokeless tobacco: Never Used  . Alcohol use No  . Drug use: No  . Sexual activity: Not Currently    Birth control/ protection: Surgical     Garald Balding, MD   Note - This record has been created using [employer].  Chart creation errors have been sought, but may not always  have been located. Such creation errors do not reflect on  the standard of medical care.

## 2017-03-10 ENCOUNTER — Encounter (INDEPENDENT_AMBULATORY_CARE_PROVIDER_SITE_OTHER): Payer: Self-pay | Admitting: Orthopaedic Surgery

## 2017-03-11 ENCOUNTER — Telehealth (INDEPENDENT_AMBULATORY_CARE_PROVIDER_SITE_OTHER): Payer: Self-pay | Admitting: Orthopaedic Surgery

## 2017-03-11 NOTE — Telephone Encounter (Signed)
Tanya from [facility] wanted to know if pt needs to hold aspirin before surgery? And if so how many day before?

## 2017-03-14 DIAGNOSIS — I10 Essential (primary) hypertension: Secondary | ICD-10-CM | POA: Diagnosis not present

## 2017-03-14 DIAGNOSIS — N182 Chronic kidney disease, stage 2 (mild): Secondary | ICD-10-CM | POA: Diagnosis not present

## 2017-03-14 DIAGNOSIS — F330 Major depressive disorder, recurrent, mild: Secondary | ICD-10-CM | POA: Diagnosis not present

## 2017-03-14 DIAGNOSIS — F3289 Other specified depressive episodes: Secondary | ICD-10-CM | POA: Diagnosis not present

## 2017-03-14 DIAGNOSIS — K219 Gastro-esophageal reflux disease without esophagitis: Secondary | ICD-10-CM | POA: Diagnosis not present

## 2017-03-14 DIAGNOSIS — E782 Mixed hyperlipidemia: Secondary | ICD-10-CM | POA: Diagnosis not present

## 2017-03-16 DIAGNOSIS — I10 Essential (primary) hypertension: Secondary | ICD-10-CM | POA: Diagnosis not present

## 2017-03-16 DIAGNOSIS — R2681 Unsteadiness on feet: Secondary | ICD-10-CM | POA: Diagnosis not present

## 2017-03-16 DIAGNOSIS — G40802 Other epilepsy, not intractable, without status epilepticus: Secondary | ICD-10-CM | POA: Diagnosis not present

## 2017-03-16 DIAGNOSIS — Z23 Encounter for immunization: Secondary | ICD-10-CM | POA: Diagnosis not present

## 2017-03-16 DIAGNOSIS — B079 Viral wart, unspecified: Secondary | ICD-10-CM | POA: Diagnosis not present

## 2017-03-16 DIAGNOSIS — E782 Mixed hyperlipidemia: Secondary | ICD-10-CM | POA: Diagnosis not present

## 2017-03-16 DIAGNOSIS — Z0001 Encounter for general adult medical examination with abnormal findings: Secondary | ICD-10-CM | POA: Diagnosis not present

## 2017-03-16 DIAGNOSIS — Z6833 Body mass index (BMI) 33.0-33.9, adult: Secondary | ICD-10-CM | POA: Diagnosis not present

## 2017-03-17 DIAGNOSIS — S92352A Displaced fracture of fifth metatarsal bone, left foot, initial encounter for closed fracture: Secondary | ICD-10-CM | POA: Diagnosis not present

## 2017-03-17 DIAGNOSIS — Y999 Unspecified external cause status: Secondary | ICD-10-CM | POA: Diagnosis not present

## 2017-03-17 DIAGNOSIS — S92352D Displaced fracture of fifth metatarsal bone, left foot, subsequent encounter for fracture with routine healing: Secondary | ICD-10-CM | POA: Diagnosis not present

## 2017-03-17 DIAGNOSIS — G8918 Other acute postprocedural pain: Secondary | ICD-10-CM | POA: Diagnosis not present

## 2017-03-23 ENCOUNTER — Ambulatory Visit (INDEPENDENT_AMBULATORY_CARE_PROVIDER_SITE_OTHER): Payer: Medicare Other | Admitting: Orthopaedic Surgery

## 2017-03-23 ENCOUNTER — Encounter (INDEPENDENT_AMBULATORY_CARE_PROVIDER_SITE_OTHER): Payer: Self-pay | Admitting: Orthopaedic Surgery

## 2017-03-23 VITALS — BP 123/61 | HR 56 | Ht 63.0 in | Wt 198.0 lb

## 2017-03-23 DIAGNOSIS — M79672 Pain in left foot: Secondary | ICD-10-CM

## 2017-03-23 NOTE — Progress Notes (Signed)
Post-Op Visit Note   Patient: Carrie Gillespie           Date of Birth: 1919-09-30           MRN: [id number] Visit Date: 03/23/2017 PCP: Curlene Labrum, MD   Assessment & Plan:  Chief Complaint:  Chief Complaint  Patient presents with  . Left Foot - Routine Post Op    Carrie Gillespie is a 83 y o status post 4 days ORIF L 5th metetarsal   Visit Diagnoses:  1. Left foot pain    6 days status post fixation of a nonunion of the base of the left fifth metatarsal and doing well.  Plan: Dressing was removed wound looks just fine. Have her return next Tuesday to remove the stitches and apply Steri-Strips. We will need to continue wearing the boot and we'll plan to see her back in 2 or 3 weeks and obtain new films. Minimal swelling of the foot. Neurovascular exam intact  Follow-Up Instructions: Return in about 1 week (around 03/30/2017).   Orders:  No orders of the defined types were placed in this encounter.  No orders of the defined types were placed in this encounter.   Imaging: No results found.  PMFS History: Patient Active Problem List   Diagnosis Date Noted  . Primary osteoarthritis of right hip 07/08/2015  . Primary osteoarthritis of left hip 12/24/2014  . S/P total hip arthroplasty 12/24/2014  . Pre-operative cardiovascular examination 11/28/2014  . Nausea with vomiting 07/29/2013  . UTI (urinary tract infection) 07/29/2013  . Bradycardia 07/29/2013  . Status post lumbar laminectomy 07/29/2013  . Syncope 07/25/2013  . Ejection fraction   . Otitis externa 12/16/2011  . Neuropathic pain of lower extremity 12/16/2011  . Nerve entrapment syndrome of lower extremity   . Hyponatremia 06/21/2011  . Insomnia 06/21/2011  . Peripheral vascular disease, unspecified ([place])   . Coronary atherosclerosis of native coronary artery   . Carotid artery disease ([place])   . HYPERLIPIDEMIA-MIXED 05/23/2009  . HTN (hypertension) 05/23/2009   Past Medical History:  Diagnosis Date  .  Anxiety   . Arthritis   . Carotid artery disease ([place])    with an occluded right vertebral artery, otherwise nonobstructive carotid artery disease  . Coronary atherosclerosis of native coronary artery    Status post stent to right coronary 2004, catheterization 2005 nonobstructive CAD, normal LV function, Cardiolite study 2008 negative for ischemia. Residual moderate ostial RCA disease. Stress testing 2012 negative for ischemia ejection fraction 78%  . Cough   . Depression    takes Cymbalta daily  . Diverticulosis   . GERD (gastroesophageal reflux disease)    takes Omeprazole daily  . History of blood transfusion    no abnormal reaction noted  . History of bronchitis    > 8yrs ago   . Joint pain   . Macular degeneration    dry  . Nerve entrapment syndrome of lower extremity     status post prior back surgery and weakness in left leg  . Other and unspecified hyperlipidemia    takes Pravastatin daily  . Peripheral neuropathy   . Peripheral vascular disease, unspecified ([place])    Minimal internal carotid artery disease  . Seizures ([place])    takes Tegretol daily;has only had 1 seizure and that was several yrs ago.  Marland Kitchen Unspecified essential hypertension    takes Lisinopril,Metoprolol,and Imdur daily    Family History  Problem Relation Age of Onset  . Cancer Mother   .  Diabetes Mother   . Kidney disease Sister   . Hypertension Other   . Diabetes Other     Past Surgical History:  Procedure Laterality Date  . BACK SURGERY  10/14  . CARDIAC CATHETERIZATION     nonobstructive coronary artery disease  . CATARACT EXTRACTION    . COLONOSCOPY    . CORONARY ANGIOPLASTY  [id number]  . KIDNEY SURGERY  2010   1/4 of right kidney removed [facility]  . KNEE ARTHROPLASTY Left 2004  . PARTIAL HYSTERECTOMY    . TOTAL HIP ARTHROPLASTY Left 12/24/2014   Procedure: TOTAL HIP ARTHROPLASTY;  Surgeon: Garald Balding, MD;  Location: [facility];  Service: Orthopedics;  Laterality: Left;  . TOTAL HIP  ARTHROPLASTY Right 07/08/2015  . TOTAL HIP ARTHROPLASTY Right 07/08/2015   Procedure: TOTAL HIP ARTHROPLASTY;  Surgeon: Garald Balding, MD;  Location: [facility];  Service: Orthopedics;  Laterality: Right;   Social History   Occupational History  . Not on file.   Social History Main Topics  . Smoking status: Never Smoker  . Smokeless tobacco: Never Used  . Alcohol use No  . Drug use: No  . Sexual activity: Not Currently    Birth control/ protection: Surgical

## 2017-04-12 ENCOUNTER — Other Ambulatory Visit: Payer: Self-pay | Admitting: Cardiology

## 2017-04-12 ENCOUNTER — Encounter: Payer: Self-pay | Admitting: *Deleted

## 2017-04-12 ENCOUNTER — Encounter: Payer: Self-pay | Admitting: Cardiology

## 2017-04-12 ENCOUNTER — Ambulatory Visit (INDEPENDENT_AMBULATORY_CARE_PROVIDER_SITE_OTHER): Payer: Medicare Other | Admitting: Cardiology

## 2017-04-12 VITALS — BP 165/75 | HR 56 | Ht 61.0 in | Wt 186.0 lb

## 2017-04-12 DIAGNOSIS — E782 Mixed hyperlipidemia: Secondary | ICD-10-CM | POA: Diagnosis not present

## 2017-04-12 DIAGNOSIS — I2510 Atherosclerotic heart disease of native coronary artery without angina pectoris: Secondary | ICD-10-CM | POA: Diagnosis not present

## 2017-04-12 DIAGNOSIS — R600 Localized edema: Secondary | ICD-10-CM | POA: Diagnosis not present

## 2017-04-12 DIAGNOSIS — I10 Essential (primary) hypertension: Secondary | ICD-10-CM | POA: Diagnosis not present

## 2017-04-12 NOTE — Progress Notes (Signed)
Clinical Summary Carrie Gillespie is a 83 y.o.female seen today for follow up of the following medical problems.    1. CAD - history of stent to RCA in 2004. Nuclear stress negative for ischemia in 2008 and 2012 - echo 07/2013 LVEF 65-70%, - 02/2016 lexiscan without significant ischemia   - last visit we increased norvasc to 10mg  daily for additional antianginal effects - no recent chest pain - compliant with meds  2. HTN - compliant with meds - home bp's 140s/70s  3. Hyperlipidemia - 01/2016 TC 213 TG 225 HDL 47 LDL 121 - upcoming labs with pcp    Past Medical History:  Diagnosis Date  . Anxiety   . Arthritis   . Carotid artery disease ([place])    with an occluded right vertebral artery, otherwise nonobstructive carotid artery disease  . Coronary atherosclerosis of native coronary artery    Status post stent to right coronary 2004, catheterization 2005 nonobstructive CAD, normal LV function, Cardiolite study 2008 negative for ischemia. Residual moderate ostial RCA disease. Stress testing 2012 negative for ischemia ejection fraction 78%  . Cough   . Depression    takes Cymbalta daily  . Diverticulosis   . GERD (gastroesophageal reflux disease)    takes Omeprazole daily  . History of blood transfusion    no abnormal reaction noted  . History of bronchitis    > 70yrs ago   . Joint pain   . Macular degeneration    dry  . Nerve entrapment syndrome of lower extremity     status post prior back surgery and weakness in left leg  . Other and unspecified hyperlipidemia    takes Pravastatin daily  . Peripheral neuropathy   . Peripheral vascular disease, unspecified ([place])    Minimal internal carotid artery disease  . Seizures ([place])    takes Tegretol daily;has only had 1 seizure and that was several yrs ago.  Marland Kitchen Unspecified essential hypertension    takes Lisinopril,Metoprolol,and Imdur daily     Allergies  Allergen Reactions  . Latex Itching and Rash     Current  Outpatient Prescriptions  Medication Sig Dispense Refill  . amLODipine (NORVASC) 10 MG tablet Take 1 tablet (10 mg total) by mouth daily. 90 tablet 1  . aspirin EC 81 MG tablet Take 81 mg by mouth daily.    . carbamazepine (TEGRETOL XR) 200 MG 12 hr tablet Take 200 mg by mouth daily.      . diphenhydrAMINE (BENADRYL) 25 MG tablet Take 25 mg by mouth every 6 (six) hours as needed for allergies.    . furosemide (LASIX) 20 MG tablet TAKE 1 TAB DAILY AS NEEDED FOR SWELLING 90 tablet 3  . ibuprofen (ADVIL,MOTRIN) 600 MG tablet Take 600 mg by mouth 2 (two) times daily as needed.    . isosorbide mononitrate (IMDUR) 120 MG 24 hr tablet Take 120 mg by mouth daily.    Marland Kitchen lisinopril (PRINIVIL,ZESTRIL) 10 MG tablet Take 10 mg by mouth daily.     . metoprolol (LOPRESSOR) 50 MG tablet Take 0.5 tablets (25 mg total) by mouth 2 (two) times daily.    . Multiple Vitamin (THERA) TABS Take by mouth.    . nitroGLYCERIN (NITROSTAT) 0.4 MG SL tablet Place 1 tablet (0.4 mg total) under the tongue every 5 (five) minutes as needed for chest pain. 25 tablet 3  . omeprazole (PRILOSEC) 20 MG capsule Take 20 mg by mouth daily. OTC    . pravastatin (PRAVACHOL) 80 MG  tablet Take 1 tablet (80 mg total) by mouth every evening. 90 tablet 3  . traMADol (ULTRAM) 50 MG tablet Take by mouth.     No current facility-administered medications for this visit.      Past Surgical History:  Procedure Laterality Date  . BACK SURGERY  10/14  . CARDIAC CATHETERIZATION     nonobstructive coronary artery disease  . CATARACT EXTRACTION    . COLONOSCOPY    . CORONARY ANGIOPLASTY  [id number]  . KIDNEY SURGERY  2010   1/4 of right kidney removed [facility]  . KNEE ARTHROPLASTY Left 2004  . PARTIAL HYSTERECTOMY    . TOTAL HIP ARTHROPLASTY Left 12/24/2014   Procedure: TOTAL HIP ARTHROPLASTY;  Surgeon: Garald Balding, MD;  Location: [facility];  Service: Orthopedics;  Laterality: Left;  . TOTAL HIP ARTHROPLASTY Right 07/08/2015  . TOTAL HIP  ARTHROPLASTY Right 07/08/2015   Procedure: TOTAL HIP ARTHROPLASTY;  Surgeon: Garald Balding, MD;  Location: [facility];  Service: Orthopedics;  Laterality: Right;     Allergies  Allergen Reactions  . Latex Itching and Rash      Family History  Problem Relation Age of Onset  . Cancer Mother   . Diabetes Mother   . Kidney disease Sister   . Hypertension Other   . Diabetes Other      Social History Carrie Gillespie reports that she has never smoked. She has never used smokeless tobacco. Carrie Gillespie reports that she does not drink alcohol.   Review of Systems CONSTITUTIONAL: No weight loss, fever, chills, weakness or fatigue.  HEENT: Eyes: No visual loss, blurred vision, double vision or yellow sclerae.No hearing loss, sneezing, congestion, runny nose or sore throat.  SKIN: No rash or itching.  CARDIOVASCULAR: per hpi RESPIRATORY: No shortness of breath, cough or sputum.  GASTROINTESTINAL: No anorexia, nausea, vomiting or diarrhea. No abdominal pain or blood.  GENITOURINARY: No burning on urination, no polyuria NEUROLOGICAL: No headache, dizziness, syncope, paralysis, ataxia, numbness or tingling in the extremities. No change in bowel or bladder control.  MUSCULOSKELETAL: No muscle, back pain, joint pain or stiffness.  LYMPHATICS: No enlarged nodes. No history of splenectomy.  PSYCHIATRIC: No history of depression or anxiety.  ENDOCRINOLOGIC: No reports of sweating, cold or heat intolerance. No polyuria or polydipsia.  Marland Kitchen   Physical Examination Vitals:   04/12/17 1300  BP: (!) 165/75  Pulse: (!) 56   Vitals:   04/12/17 1300  Weight: 186 lb (84.4 kg)  Height: 5\' 1"  (1.549 m)    Gen: resting comfortably, no acute distress HEENT: no scleral icterus, pupils equal round and reactive, no palptable cervical adenopathy,  CV: RRR, no m/r/g, no jvd Resp: Clear to auscultation bilaterally GI: abdomen is soft, non-tender, non-distended, normal bowel sounds, no hepatosplenomegaly MSK:  extremities are warm, no edema.  Skin: warm, no rash Neuro:  no focal deficits Psych: appropriate affect   Diagnostic Studies 07/2013 Carotid US Summary: Right: moderate intimal wall thickening CCA. Mild mixed plaque origin ICA. Left: mild sof tplaque distal CCA. Mild calcific plaque origin ICA with acoustic shadowing. Bilateral: 1-395 ICA stenosis. Vertebral artery flow is antegrade. ICA/CCA ratio: R-0.59 L-1.0   02/2016 Lexiscan Nuclear stress  Horizontal ST segment depression after Lexiscan injection. ST segment depression was noted during stress in the II, III, aVF, V5 and V6 leads. Nonspecific finding in setting of severe hypertension  The study is normal. There are no perfusion defects consistent with prior infarction or current ischemia.  This is a low  risk study.  The left ventricular ejection fraction is hyperdynamic (>65%).    Assessment and Plan   1. CAD - no recent symptoms - continue current meds  2. LE edema - conitnue lasix 20mg  prn  3. HTN - elevated in clinic, home numbers have been at goal - continue current meds  4. Hyperlpidemia - continue statin    F/u 6months     Arnoldo Lenis, M.D.

## 2017-04-12 NOTE — Patient Instructions (Signed)

## 2017-04-13 ENCOUNTER — Ambulatory Visit (INDEPENDENT_AMBULATORY_CARE_PROVIDER_SITE_OTHER): Payer: Medicare Other | Admitting: Orthopaedic Surgery

## 2017-04-13 ENCOUNTER — Ambulatory Visit (INDEPENDENT_AMBULATORY_CARE_PROVIDER_SITE_OTHER): Payer: Medicare Other

## 2017-04-13 DIAGNOSIS — M79604 Pain in right leg: Secondary | ICD-10-CM

## 2017-04-13 NOTE — Progress Notes (Signed)
Post-Op Visit Note   Patient: Carrie Gillespie           Date of Birth: 1920-09-15           MRN: [id number] Visit Date: 04/13/2017 PCP: Curlene Labrum, MD   Assessment & Plan:  Chief Complaint:  Chief Complaint  Patient presents with  . Right Foot - Routine Post Op   Visit Diagnoses:  1. 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 Doing well using her rolling walker for balance and using the equalizer boot. She's not having any pain  Plan: Films demonstrate excellent position of the screw looks like the fracture is filling in. We will progress to an ankle support to prevent inversion or eversion and bear weight to tolerance. We'll see in 1 month  Follow-Up Instructions: Return in about 1 month (around 05/14/2017).   Orders:  Orders Placed This Encounter  Procedures  . XR Foot Complete Left   No orders of the defined types were placed in this encounter.   Imaging: Xr Foot Complete Left  Result Date: 04/13/2017 Films of the left foot were obtained in 2 projections. The fracture at the base of the fifth metatarsal is in good position. There appears to be some bone filling in the fracture line.   PMFS History: Patient Active Problem List   Diagnosis Date Noted  . Primary osteoarthritis of right hip 07/08/2015  . Primary osteoarthritis of left hip 12/24/2014  . S/P total hip arthroplasty 12/24/2014  . Pre-operative cardiovascular examination 11/28/2014  . Nausea with vomiting 07/29/2013  . UTI (urinary tract infection) 07/29/2013  . Bradycardia 07/29/2013  . Status post lumbar laminectomy 07/29/2013  . Syncope 07/25/2013  . Ejection fraction   . Otitis externa 12/16/2011  . Neuropathic pain of lower extremity 12/16/2011  . Nerve entrapment syndrome of lower extremity   . Hyponatremia 06/21/2011  . Insomnia 06/21/2011  . Peripheral vascular disease, unspecified  ([place])   . Coronary atherosclerosis of native coronary artery   . Carotid artery disease ([place])   . HYPERLIPIDEMIA-MIXED 05/23/2009  . HTN (hypertension) 05/23/2009   Past Medical History:  Diagnosis Date  . Anxiety   . Arthritis   . Carotid artery disease ([place])    with an occluded right vertebral artery, otherwise nonobstructive carotid artery disease  . Coronary atherosclerosis of native coronary artery    Status post stent to right coronary 2004, catheterization 2005 nonobstructive CAD, normal LV function, Cardiolite study 2008 negative for ischemia. Residual moderate ostial RCA disease. Stress testing 2012 negative for ischemia ejection fraction 78%  . Cough   . Depression    takes Cymbalta daily  . Diverticulosis   . GERD (gastroesophageal reflux disease)    takes Omeprazole daily  . History of blood transfusion    no abnormal reaction noted  . History of bronchitis    > 79yrs ago   . Joint pain   . Macular degeneration    dry  . Nerve entrapment syndrome of lower extremity     status post prior back surgery and weakness in left leg  . Other and unspecified hyperlipidemia    takes Pravastatin daily  . Peripheral neuropathy   . Peripheral vascular disease, unspecified ([place])    Minimal internal carotid artery disease  . Seizures ([place])    takes Tegretol daily;has only had 1 seizure and that was several yrs  ago.  . Unspecified essential hypertension    takes Lisinopril,Metoprolol,and Imdur daily    Family History  Problem Relation Age of Onset  . Cancer Mother   . Diabetes Mother   . Kidney disease Sister   . Hypertension Other   . Diabetes Other     Past Surgical History:  Procedure Laterality Date  . BACK SURGERY  10/14  . CARDIAC CATHETERIZATION     nonobstructive coronary artery disease  . CATARACT EXTRACTION    . COLONOSCOPY    . CORONARY ANGIOPLASTY  [id number]  . KIDNEY SURGERY  2010   1/4 of right kidney removed [facility]  . KNEE ARTHROPLASTY Left 2004  .  PARTIAL HYSTERECTOMY    . TOTAL HIP ARTHROPLASTY Left 12/24/2014   Procedure: TOTAL HIP ARTHROPLASTY;  Surgeon: Garald Balding, MD;  Location: [facility];  Service: Orthopedics;  Laterality: Left;  . TOTAL HIP ARTHROPLASTY Right 07/08/2015  . TOTAL HIP ARTHROPLASTY Right 07/08/2015   Procedure: TOTAL HIP ARTHROPLASTY;  Surgeon: Garald Balding, MD;  Location: [facility];  Service: Orthopedics;  Laterality: Right;   Social History   Occupational History  . Not on file.   Social History Main Topics  . Smoking status: Never Smoker  . Smokeless tobacco: Never Used  . Alcohol use No  . Drug use: No  . Sexual activity: Not Currently    Birth control/ protection: Surgical

## 2017-04-23 DIAGNOSIS — R404 Transient alteration of awareness: Secondary | ICD-10-CM | POA: Diagnosis not present

## 2017-04-23 DIAGNOSIS — R531 Weakness: Secondary | ICD-10-CM | POA: Diagnosis not present

## 2017-04-23 DIAGNOSIS — R1110 Vomiting, unspecified: Secondary | ICD-10-CM | POA: Diagnosis not present

## 2017-04-23 DIAGNOSIS — Z7982 Long term (current) use of aspirin: Secondary | ICD-10-CM | POA: Diagnosis not present

## 2017-04-23 DIAGNOSIS — I10 Essential (primary) hypertension: Secondary | ICD-10-CM | POA: Diagnosis not present

## 2017-04-23 DIAGNOSIS — E7800 Pure hypercholesterolemia, unspecified: Secondary | ICD-10-CM | POA: Diagnosis not present

## 2017-04-23 DIAGNOSIS — R112 Nausea with vomiting, unspecified: Secondary | ICD-10-CM | POA: Diagnosis not present

## 2017-04-23 DIAGNOSIS — Z8249 Family history of ischemic heart disease and other diseases of the circulatory system: Secondary | ICD-10-CM | POA: Diagnosis not present

## 2017-04-23 DIAGNOSIS — R0902 Hypoxemia: Secondary | ICD-10-CM | POA: Diagnosis not present

## 2017-04-23 DIAGNOSIS — Z79899 Other long term (current) drug therapy: Secondary | ICD-10-CM | POA: Diagnosis not present

## 2017-04-23 DIAGNOSIS — R55 Syncope and collapse: Secondary | ICD-10-CM | POA: Diagnosis not present

## 2017-04-23 DIAGNOSIS — I519 Heart disease, unspecified: Secondary | ICD-10-CM | POA: Diagnosis not present

## 2017-05-04 ENCOUNTER — Inpatient Hospital Stay (INDEPENDENT_AMBULATORY_CARE_PROVIDER_SITE_OTHER): Payer: Medicare Other | Admitting: Orthopaedic Surgery

## 2017-05-04 ENCOUNTER — Ambulatory Visit (INDEPENDENT_AMBULATORY_CARE_PROVIDER_SITE_OTHER): Payer: Medicare Other | Admitting: Orthopaedic Surgery

## 2017-05-04 DIAGNOSIS — M79672 Pain in left foot: Secondary | ICD-10-CM

## 2017-05-04 NOTE — Progress Notes (Signed)
Office Visit Note   Patient: Carrie Gillespie           Date of Birth: Jan 13, 1920           MRN: [id number] Visit Date: 05/04/2017              Requested by: Curlene Labrum, MD [place], [place] [address] PCP: Curlene Labrum, MD   Assessment & Plan: Visit Diagnoses:  1. Left foot pain   6-1/2 weeks status post open reduction internal fixation of a nonunion of the base of the left fifth metatarsal fracture and doing well. She's been wearing the ankle support for the past 2 weeks and has been quite comfortable. She does use a rolling walker because of balance issues.  Plan: Removed ankle support and ambulate as tolerated fully weightbearing. Continue using the rolling walker. Return in 2 weeks  Follow-Up Instructions: Return in about 2 weeks (around 05/18/2017).   Orders:  No orders of the defined types were placed in this encounter.  No orders of the defined types were placed in this encounter.     Procedures: No procedures performed   Clinical Data: No additional findings.   Subjective: No chief complaint on file. 6-1/2 weeks status post open reduction internal fixation of the nonunion fracture the base of the left fifth metatarsal. Doing well. No history of fever or chills shortness of breath or chest pain. Presently uncomfortable in the ankle support and would "like to remove it". She doesn't have any the pain she was experiencing preoperatively.  HPI  Review of Systems   Objective: Vital Signs: There were no vitals taken for this visit.  Physical Exam  Ortho Exam no significant pain along the base of the left fifth metatarsal. Incision is healed without problem. Neurovascular exam intact  Specialty Comments:  No specialty comments available.  Imaging: No results found.   PMFS History: Patient Active Problem List   Diagnosis Date Noted  . Primary osteoarthritis of right hip 07/08/2015  . Primary osteoarthritis of left hip 12/24/2014  . S/P  total hip arthroplasty 12/24/2014  . Pre-operative cardiovascular examination 11/28/2014  . Nausea with vomiting 07/29/2013  . UTI (urinary tract infection) 07/29/2013  . Bradycardia 07/29/2013  . Status post lumbar laminectomy 07/29/2013  . Syncope 07/25/2013  . Ejection fraction   . Otitis externa 12/16/2011  . Neuropathic pain of lower extremity 12/16/2011  . Nerve entrapment syndrome of lower extremity   . Hyponatremia 06/21/2011  . Insomnia 06/21/2011  . Peripheral vascular disease, unspecified ([place])   . Coronary atherosclerosis of native coronary artery   . Carotid artery disease ([place])   . HYPERLIPIDEMIA-MIXED 05/23/2009  . HTN (hypertension) 05/23/2009   Past Medical History:  Diagnosis Date  . Anxiety   . Arthritis   . Carotid artery disease ([place])    with an occluded right vertebral artery, otherwise nonobstructive carotid artery disease  . Coronary atherosclerosis of native coronary artery    Status post stent to right coronary 2004, catheterization 2005 nonobstructive CAD, normal LV function, Cardiolite study 2008 negative for ischemia. Residual moderate ostial RCA disease. Stress testing 2012 negative for ischemia ejection fraction 78%  . Cough   . Depression    takes Cymbalta daily  . Diverticulosis   . GERD (gastroesophageal reflux disease)    takes Omeprazole daily  . History of blood transfusion    no abnormal reaction noted  . History of bronchitis    > 70yrs ago   .  Joint pain   . Macular degeneration    dry  . Nerve entrapment syndrome of lower extremity     status post prior back surgery and weakness in left leg  . Other and unspecified hyperlipidemia    takes Pravastatin daily  . Peripheral neuropathy   . Peripheral vascular disease, unspecified ([place])    Minimal internal carotid artery disease  . Seizures ([place])    takes Tegretol daily;has only had 1 seizure and that was several yrs ago.  Marland Kitchen Unspecified essential hypertension    takes  Lisinopril,Metoprolol,and Imdur daily    Family History  Problem Relation Age of Onset  . Cancer Mother   . Diabetes Mother   . Kidney disease Sister   . Hypertension Other   . Diabetes Other     Past Surgical History:  Procedure Laterality Date  . BACK SURGERY  10/14  . CARDIAC CATHETERIZATION     nonobstructive coronary artery disease  . CATARACT EXTRACTION    . COLONOSCOPY    . CORONARY ANGIOPLASTY  [id number]  . KIDNEY SURGERY  2010   1/4 of right kidney removed [facility]  . KNEE ARTHROPLASTY Left 2004  . PARTIAL HYSTERECTOMY    . TOTAL HIP ARTHROPLASTY Left 12/24/2014   Procedure: TOTAL HIP ARTHROPLASTY;  Surgeon: Garald Balding, MD;  Location: [facility];  Service: Orthopedics;  Laterality: Left;  . TOTAL HIP ARTHROPLASTY Right 07/08/2015  . TOTAL HIP ARTHROPLASTY Right 07/08/2015   Procedure: TOTAL HIP ARTHROPLASTY;  Surgeon: Garald Balding, MD;  Location: [facility];  Service: Orthopedics;  Laterality: Right;   Social History   Occupational History  . Not on file.   Social History Main Topics  . Smoking status: Never Smoker  . Smokeless tobacco: Never Used  . Alcohol use No  . Drug use: No  . Sexual activity: Not Currently    Birth control/ protection: Surgical     Garald Balding, MD   Note - This record has been created using [employer].  Chart creation errors have been sought, but may not always  have been located. Such creation errors do not reflect on  the standard of medical care.

## 2017-05-24 DIAGNOSIS — N281 Cyst of kidney, acquired: Secondary | ICD-10-CM | POA: Diagnosis not present

## 2017-05-24 DIAGNOSIS — Z905 Acquired absence of kidney: Secondary | ICD-10-CM | POA: Diagnosis not present

## 2017-05-24 DIAGNOSIS — Q6101 Congenital single renal cyst: Secondary | ICD-10-CM | POA: Diagnosis not present

## 2017-05-24 DIAGNOSIS — R8299 Other abnormal findings in urine: Secondary | ICD-10-CM | POA: Diagnosis not present

## 2017-05-24 DIAGNOSIS — Z08 Encounter for follow-up examination after completed treatment for malignant neoplasm: Secondary | ICD-10-CM | POA: Diagnosis not present

## 2017-05-24 DIAGNOSIS — Z85528 Personal history of other malignant neoplasm of kidney: Secondary | ICD-10-CM | POA: Diagnosis not present

## 2017-05-25 ENCOUNTER — Ambulatory Visit (INDEPENDENT_AMBULATORY_CARE_PROVIDER_SITE_OTHER): Payer: Medicare Other | Admitting: Orthopaedic Surgery

## 2017-05-25 ENCOUNTER — Encounter (INDEPENDENT_AMBULATORY_CARE_PROVIDER_SITE_OTHER): Payer: Self-pay | Admitting: Orthopaedic Surgery

## 2017-05-25 VITALS — BP 125/63 | HR 70 | Resp 14 | Ht 63.0 in | Wt 192.0 lb

## 2017-05-25 DIAGNOSIS — M79672 Pain in left foot: Secondary | ICD-10-CM

## 2017-05-25 NOTE — Progress Notes (Signed)
Office Visit Note   Patient: Carrie Gillespie           Date of Birth: [birth year]           MRN: [id number] Visit Date: 05/25/2017              Requested by: Curlene Labrum, MD [place], [place] [address] PCP: Curlene Labrum, MD   Assessment & Plan: Visit Diagnoses:  Status post open reduction internal fixation of nonhealing fracture base of the left fifth metatarsal and doing very well Plan: Ambulation as tolerated. Office as needed  Follow-Up Instructions: No Follow-up on file.   Orders:  No orders of the defined types were placed in this encounter.  No orders of the defined types were placed in this encounter.     Procedures: No procedures performed   Clinical Data: No additional findings.   Subjective: No chief complaint on file. Several months status post open reduction and internal fixation of nonhealing fracture at the base of the left fifth metatarsal. Doing well. Continues to use a rolling walker for balance. No related pain.  HPI  Review of Systems  Constitutional: Negative for chills, fatigue and fever.  Eyes: Negative for itching.  Respiratory: Negative for chest tightness and shortness of breath.   Cardiovascular: Negative for chest pain, palpitations and leg swelling.  Gastrointestinal: Negative for blood in stool, constipation and diarrhea.  Musculoskeletal: Positive for joint swelling. Negative for back pain, neck pain and neck stiffness.  Neurological: Negative for dizziness, weakness, numbness and headaches.  Hematological: Does not bruise/bleed easily.  Psychiatric/Behavioral: Negative for sleep disturbance. The patient is not nervous/anxious.      Objective: Vital Signs: There were no vitals taken for this visit.  Physical Exam  Ortho Exam mild swelling of left foot. The incision is healed nicely along the base of the fifth metatarsal. No significant pain. Skin intact. Sensory exam intact. Specialty Comments:  No specialty  comments available.  Imaging: No results found.   PMFS History: Patient Active Problem List   Diagnosis Date Noted  . Primary osteoarthritis of right hip 07/08/2015  . Primary osteoarthritis of left hip 12/24/2014  . S/P total hip arthroplasty 12/24/2014  . Pre-operative cardiovascular examination 11/28/2014  . Nausea with vomiting 07/29/2013  . UTI (urinary tract infection) 07/29/2013  . Bradycardia 07/29/2013  . Status post lumbar laminectomy 07/29/2013  . Syncope 07/25/2013  . Ejection fraction   . Otitis externa 12/16/2011  . Neuropathic pain of lower extremity 12/16/2011  . Nerve entrapment syndrome of lower extremity   . Hyponatremia 06/21/2011  . Insomnia 06/21/2011  . Peripheral vascular disease, unspecified ([place])   . Coronary atherosclerosis of native coronary artery   . Carotid artery disease ([place])   . HYPERLIPIDEMIA-MIXED 05/23/2009  . HTN (hypertension) 05/23/2009   Past Medical History:  Diagnosis Date  . Anxiety   . Arthritis   . Carotid artery disease ([place])    with an occluded right vertebral artery, otherwise nonobstructive carotid artery disease  . Coronary atherosclerosis of native coronary artery    Status post stent to right coronary 2004, catheterization 2005 nonobstructive CAD, normal LV function, Cardiolite study 2008 negative for ischemia. Residual moderate ostial RCA disease. Stress testing 2012 negative for ischemia ejection fraction 78%  . Cough   . Depression    takes Cymbalta daily  . Diverticulosis   . GERD (gastroesophageal reflux disease)    takes Omeprazole daily  . History of blood transfusion  no abnormal reaction noted  . History of bronchitis    > 60yrs ago   . Joint pain   . Macular degeneration    dry  . Nerve entrapment syndrome of lower extremity     status post prior back surgery and weakness in left leg  . Other and unspecified hyperlipidemia    takes Pravastatin daily  . Peripheral neuropathy   . Peripheral vascular  disease, unspecified ([place])    Minimal internal carotid artery disease  . Seizures ([place])    takes Tegretol daily;has only had 1 seizure and that was several yrs ago.  Marland Kitchen Unspecified essential hypertension    takes Lisinopril,Metoprolol,and Imdur daily    Family History  Problem Relation Age of Onset  . Cancer Mother   . Diabetes Mother   . Kidney disease Sister   . Hypertension Other   . Diabetes Other     Past Surgical History:  Procedure Laterality Date  . BACK SURGERY  10/14  . CARDIAC CATHETERIZATION     nonobstructive coronary artery disease  . CATARACT EXTRACTION    . COLONOSCOPY    . CORONARY ANGIOPLASTY  [id number]  . KIDNEY SURGERY  2010   1/4 of right kidney removed [facility]  . KNEE ARTHROPLASTY Left 2004  . PARTIAL HYSTERECTOMY    . TOTAL HIP ARTHROPLASTY Left 12/24/2014   Procedure: TOTAL HIP ARTHROPLASTY;  Surgeon: Garald Balding, MD;  Location: [facility];  Service: Orthopedics;  Laterality: Left;  . TOTAL HIP ARTHROPLASTY Right 07/08/2015  . TOTAL HIP ARTHROPLASTY Right 07/08/2015   Procedure: TOTAL HIP ARTHROPLASTY;  Surgeon: Garald Balding, MD;  Location: [facility];  Service: Orthopedics;  Laterality: Right;   Social History   Occupational History  . Not on file.   Social History Main Topics  . Smoking status: Never Smoker  . Smokeless tobacco: Never Used  . Alcohol use No  . Drug use: No  . Sexual activity: Not Currently    Birth control/ protection: Surgical

## 2017-06-10 DIAGNOSIS — I10 Essential (primary) hypertension: Secondary | ICD-10-CM | POA: Diagnosis not present

## 2017-06-10 DIAGNOSIS — E559 Vitamin D deficiency, unspecified: Secondary | ICD-10-CM | POA: Diagnosis not present

## 2017-06-10 DIAGNOSIS — E782 Mixed hyperlipidemia: Secondary | ICD-10-CM | POA: Diagnosis not present

## 2017-06-10 DIAGNOSIS — N184 Chronic kidney disease, stage 4 (severe): Secondary | ICD-10-CM | POA: Diagnosis not present

## 2017-06-10 DIAGNOSIS — K219 Gastro-esophageal reflux disease without esophagitis: Secondary | ICD-10-CM | POA: Diagnosis not present

## 2017-06-10 DIAGNOSIS — E871 Hypo-osmolality and hyponatremia: Secondary | ICD-10-CM | POA: Diagnosis not present

## 2017-06-16 DIAGNOSIS — E782 Mixed hyperlipidemia: Secondary | ICD-10-CM | POA: Diagnosis not present

## 2017-06-16 DIAGNOSIS — E871 Hypo-osmolality and hyponatremia: Secondary | ICD-10-CM | POA: Diagnosis not present

## 2017-06-16 DIAGNOSIS — F4320 Adjustment disorder, unspecified: Secondary | ICD-10-CM | POA: Diagnosis not present

## 2017-06-16 DIAGNOSIS — N184 Chronic kidney disease, stage 4 (severe): Secondary | ICD-10-CM | POA: Diagnosis not present

## 2017-06-16 DIAGNOSIS — I10 Essential (primary) hypertension: Secondary | ICD-10-CM | POA: Diagnosis not present

## 2017-06-16 DIAGNOSIS — I209 Angina pectoris, unspecified: Secondary | ICD-10-CM | POA: Diagnosis not present

## 2017-06-16 DIAGNOSIS — F5101 Primary insomnia: Secondary | ICD-10-CM | POA: Diagnosis not present

## 2017-06-16 DIAGNOSIS — Z6833 Body mass index (BMI) 33.0-33.9, adult: Secondary | ICD-10-CM | POA: Diagnosis not present

## 2017-06-21 DIAGNOSIS — R600 Localized edema: Secondary | ICD-10-CM | POA: Diagnosis not present

## 2017-06-21 DIAGNOSIS — Z79899 Other long term (current) drug therapy: Secondary | ICD-10-CM | POA: Diagnosis not present

## 2017-06-21 DIAGNOSIS — Z7982 Long term (current) use of aspirin: Secondary | ICD-10-CM | POA: Diagnosis not present

## 2017-06-21 DIAGNOSIS — M25512 Pain in left shoulder: Secondary | ICD-10-CM | POA: Diagnosis not present

## 2017-06-21 DIAGNOSIS — Z955 Presence of coronary angioplasty implant and graft: Secondary | ICD-10-CM | POA: Diagnosis not present

## 2017-06-21 DIAGNOSIS — I519 Heart disease, unspecified: Secondary | ICD-10-CM | POA: Diagnosis not present

## 2017-06-21 DIAGNOSIS — W19XXXA Unspecified fall, initial encounter: Secondary | ICD-10-CM | POA: Diagnosis not present

## 2017-06-21 DIAGNOSIS — R9431 Abnormal electrocardiogram [ECG] [EKG]: Secondary | ICD-10-CM | POA: Diagnosis not present

## 2017-06-21 DIAGNOSIS — R040 Epistaxis: Secondary | ICD-10-CM | POA: Diagnosis not present

## 2017-06-21 DIAGNOSIS — K219 Gastro-esophageal reflux disease without esophagitis: Secondary | ICD-10-CM | POA: Diagnosis not present

## 2017-06-21 DIAGNOSIS — I10 Essential (primary) hypertension: Secondary | ICD-10-CM | POA: Diagnosis not present

## 2017-06-21 DIAGNOSIS — Z8249 Family history of ischemic heart disease and other diseases of the circulatory system: Secondary | ICD-10-CM | POA: Diagnosis not present

## 2017-06-21 DIAGNOSIS — E7800 Pure hypercholesterolemia, unspecified: Secondary | ICD-10-CM | POA: Diagnosis not present

## 2017-06-21 DIAGNOSIS — S0990XA Unspecified injury of head, initial encounter: Secondary | ICD-10-CM | POA: Diagnosis not present

## 2017-07-07 ENCOUNTER — Encounter (INDEPENDENT_AMBULATORY_CARE_PROVIDER_SITE_OTHER): Payer: Medicare Other | Admitting: Ophthalmology

## 2017-07-12 ENCOUNTER — Encounter (INDEPENDENT_AMBULATORY_CARE_PROVIDER_SITE_OTHER): Payer: Medicare Other | Admitting: Ophthalmology

## 2017-07-12 DIAGNOSIS — H35033 Hypertensive retinopathy, bilateral: Secondary | ICD-10-CM | POA: Diagnosis not present

## 2017-07-12 DIAGNOSIS — I10 Essential (primary) hypertension: Secondary | ICD-10-CM | POA: Diagnosis not present

## 2017-07-12 DIAGNOSIS — H353134 Nonexudative age-related macular degeneration, bilateral, advanced atrophic with subfoveal involvement: Secondary | ICD-10-CM | POA: Diagnosis not present

## 2017-07-12 DIAGNOSIS — D3132 Benign neoplasm of left choroid: Secondary | ICD-10-CM

## 2017-07-12 DIAGNOSIS — H43813 Vitreous degeneration, bilateral: Secondary | ICD-10-CM | POA: Diagnosis not present

## 2017-07-13 ENCOUNTER — Encounter (INDEPENDENT_AMBULATORY_CARE_PROVIDER_SITE_OTHER): Payer: Medicare Other | Admitting: Ophthalmology

## 2017-07-20 ENCOUNTER — Other Ambulatory Visit: Payer: Self-pay | Admitting: Cardiology

## 2017-08-16 DIAGNOSIS — H6692 Otitis media, unspecified, left ear: Secondary | ICD-10-CM | POA: Diagnosis not present

## 2017-08-16 DIAGNOSIS — H9202 Otalgia, left ear: Secondary | ICD-10-CM | POA: Diagnosis not present

## 2017-09-02 DIAGNOSIS — B0052 Herpesviral keratitis: Secondary | ICD-10-CM | POA: Diagnosis not present

## 2017-09-09 DIAGNOSIS — B0052 Herpesviral keratitis: Secondary | ICD-10-CM | POA: Diagnosis not present

## 2017-09-13 DIAGNOSIS — D519 Vitamin B12 deficiency anemia, unspecified: Secondary | ICD-10-CM | POA: Diagnosis not present

## 2017-09-13 DIAGNOSIS — Z6833 Body mass index (BMI) 33.0-33.9, adult: Secondary | ICD-10-CM | POA: Diagnosis not present

## 2017-09-13 DIAGNOSIS — Z96641 Presence of right artificial hip joint: Secondary | ICD-10-CM | POA: Diagnosis not present

## 2017-09-13 DIAGNOSIS — S92355P Nondisplaced fracture of fifth metatarsal bone, left foot, subsequent encounter for fracture with malunion: Secondary | ICD-10-CM | POA: Diagnosis not present

## 2017-09-13 DIAGNOSIS — N184 Chronic kidney disease, stage 4 (severe): Secondary | ICD-10-CM | POA: Diagnosis not present

## 2017-09-13 DIAGNOSIS — F3289 Other specified depressive episodes: Secondary | ICD-10-CM | POA: Diagnosis not present

## 2017-09-13 DIAGNOSIS — E871 Hypo-osmolality and hyponatremia: Secondary | ICD-10-CM | POA: Diagnosis not present

## 2017-09-13 DIAGNOSIS — F4320 Adjustment disorder, unspecified: Secondary | ICD-10-CM | POA: Diagnosis not present

## 2017-09-13 DIAGNOSIS — I10 Essential (primary) hypertension: Secondary | ICD-10-CM | POA: Diagnosis not present

## 2017-09-13 DIAGNOSIS — Z23 Encounter for immunization: Secondary | ICD-10-CM | POA: Diagnosis not present

## 2017-09-13 DIAGNOSIS — G2581 Restless legs syndrome: Secondary | ICD-10-CM | POA: Diagnosis not present

## 2017-09-13 DIAGNOSIS — I209 Angina pectoris, unspecified: Secondary | ICD-10-CM | POA: Diagnosis not present

## 2017-09-13 DIAGNOSIS — F5101 Primary insomnia: Secondary | ICD-10-CM | POA: Diagnosis not present

## 2017-09-13 DIAGNOSIS — E782 Mixed hyperlipidemia: Secondary | ICD-10-CM | POA: Diagnosis not present

## 2017-09-16 DIAGNOSIS — R600 Localized edema: Secondary | ICD-10-CM | POA: Diagnosis not present

## 2017-09-16 DIAGNOSIS — H52223 Regular astigmatism, bilateral: Secondary | ICD-10-CM | POA: Diagnosis not present

## 2017-09-16 DIAGNOSIS — H353133 Nonexudative age-related macular degeneration, bilateral, advanced atrophic without subfoveal involvement: Secondary | ICD-10-CM | POA: Diagnosis not present

## 2017-09-16 DIAGNOSIS — B0052 Herpesviral keratitis: Secondary | ICD-10-CM | POA: Diagnosis not present

## 2017-09-19 DIAGNOSIS — H9202 Otalgia, left ear: Secondary | ICD-10-CM | POA: Diagnosis not present

## 2017-09-19 DIAGNOSIS — Z0131 Encounter for examination of blood pressure with abnormal findings: Secondary | ICD-10-CM | POA: Diagnosis not present

## 2017-09-19 DIAGNOSIS — M26629 Arthralgia of temporomandibular joint, unspecified side: Secondary | ICD-10-CM | POA: Diagnosis not present

## 2017-10-13 ENCOUNTER — Ambulatory Visit: Payer: Medicare Other | Admitting: Cardiology

## 2017-11-04 DIAGNOSIS — R21 Rash and other nonspecific skin eruption: Secondary | ICD-10-CM | POA: Diagnosis not present

## 2017-11-04 DIAGNOSIS — L509 Urticaria, unspecified: Secondary | ICD-10-CM | POA: Diagnosis not present

## 2017-11-10 DIAGNOSIS — L299 Pruritus, unspecified: Secondary | ICD-10-CM | POA: Diagnosis not present

## 2017-11-10 DIAGNOSIS — L509 Urticaria, unspecified: Secondary | ICD-10-CM | POA: Diagnosis not present

## 2017-11-21 ENCOUNTER — Ambulatory Visit (INDEPENDENT_AMBULATORY_CARE_PROVIDER_SITE_OTHER): Payer: Medicare Other | Admitting: Cardiology

## 2017-11-21 ENCOUNTER — Encounter: Payer: Self-pay | Admitting: Cardiology

## 2017-11-21 ENCOUNTER — Encounter: Payer: Self-pay | Admitting: *Deleted

## 2017-11-21 VITALS — BP 151/67 | HR 54 | Ht 61.0 in | Wt 185.4 lb

## 2017-11-21 DIAGNOSIS — E782 Mixed hyperlipidemia: Secondary | ICD-10-CM | POA: Diagnosis not present

## 2017-11-21 DIAGNOSIS — I2510 Atherosclerotic heart disease of native coronary artery without angina pectoris: Secondary | ICD-10-CM

## 2017-11-21 DIAGNOSIS — I10 Essential (primary) hypertension: Secondary | ICD-10-CM | POA: Diagnosis not present

## 2017-11-21 NOTE — Patient Instructions (Signed)

## 2017-11-21 NOTE — Progress Notes (Signed)
Clinical Summary Ms. Woodle is a 84 y.o.female seen today for follow up of the following medical problems.    1. CAD - history of stent to RCA in 2004. Nuclear stress negative for ischemia in 2008 and 2012 - echo 07/2013 LVEF 65-70%, - 02/2016 lexiscan without significant ischemia    - no recent chest pain. No SOB or DOE - compliant with meds.   2. HTN - compliant with meds  - home bp's 140s-150s/70-80s  3. Hyperlipidemia - upcoming labs with pcp    Past Medical History:  Diagnosis Date  . Anxiety   . Arthritis   . Carotid artery disease ([place])    with an occluded right vertebral artery, otherwise nonobstructive carotid artery disease  . Coronary atherosclerosis of native coronary artery    Status post stent to right coronary 2004, catheterization 2005 nonobstructive CAD, normal LV function, Cardiolite study 2008 negative for ischemia. Residual moderate ostial RCA disease. Stress testing 2012 negative for ischemia ejection fraction 78%  . Cough   . Depression    takes Cymbalta daily  . Diverticulosis   . GERD (gastroesophageal reflux disease)    takes Omeprazole daily  . History of blood transfusion    no abnormal reaction noted  . History of bronchitis    > 30yrs ago   . Joint pain   . Macular degeneration    dry  . Nerve entrapment syndrome of lower extremity     status post prior back surgery and weakness in left leg  . Other and unspecified hyperlipidemia    takes Pravastatin daily  . Peripheral neuropathy   . Peripheral vascular disease, unspecified ([place])    Minimal internal carotid artery disease  . Seizures ([place])    takes Tegretol daily;has only had 1 seizure and that was several yrs ago.  Marland Kitchen Unspecified essential hypertension    takes Lisinopril,Metoprolol,and Imdur daily     Allergies  Allergen Reactions  . Latex Itching and Rash     Current Outpatient Medications  Medication Sig Dispense Refill  . amLODipine (NORVASC) 10 MG tablet  Take 10 mg by mouth daily.    Marland Kitchen amLODipine (NORVASC) 10 MG tablet TAKE 1 TABLET BY MOUTH ONCE DAILY (DOSE  INCREASE) 90 tablet 1  . amLODipine (NORVASC) 5 MG tablet TAKE ONE TABLET BY MOUTH ONCE DAILY 90 tablet 3  . aspirin EC 81 MG tablet Take 81 mg by mouth daily.    . carbamazepine (TEGRETOL XR) 200 MG 12 hr tablet Take 200 mg by mouth daily.      . diphenhydrAMINE (BENADRYL) 25 MG tablet Take 25 mg by mouth every 6 (six) hours as needed for allergies.    . furosemide (LASIX) 20 MG tablet TAKE 1 TAB DAILY AS NEEDED FOR SWELLING 90 tablet 3  . ibuprofen (ADVIL,MOTRIN) 600 MG tablet Take 600 mg by mouth 2 (two) times daily as needed.    . isosorbide mononitrate (IMDUR) 120 MG 24 hr tablet Take 120 mg by mouth daily.    Marland Kitchen lisinopril (PRINIVIL,ZESTRIL) 10 MG tablet Take 10 mg by mouth daily.     . metoprolol (LOPRESSOR) 50 MG tablet Take 0.5 tablets (25 mg total) by mouth 2 (two) times daily.    . Multiple Vitamin (THERA) TABS Take by mouth.    . nitroGLYCERIN (NITROSTAT) 0.4 MG SL tablet Place 1 tablet (0.4 mg total) under the tongue every 5 (five) minutes as needed for chest pain. 25 tablet 3  . omeprazole (PRILOSEC) 20  MG capsule Take 20 mg by mouth daily. OTC    . pravastatin (PRAVACHOL) 80 MG tablet Take 1 tablet (80 mg total) by mouth every evening. 90 tablet 3  . traMADol (ULTRAM) 50 MG tablet Take by mouth.     No current facility-administered medications for this visit.      Past Surgical History:  Procedure Laterality Date  . BACK SURGERY  10/14  . CARDIAC CATHETERIZATION     nonobstructive coronary artery disease  . CATARACT EXTRACTION    . COLONOSCOPY    . CORONARY ANGIOPLASTY  [id number]  . KIDNEY SURGERY  2010   1/4 of right kidney removed [facility]  . KNEE ARTHROPLASTY Left 2004  . PARTIAL HYSTERECTOMY    . TOTAL HIP ARTHROPLASTY Left 12/24/2014   Procedure: TOTAL HIP ARTHROPLASTY;  Surgeon: Garald Balding, MD;  Location: [facility] [place];  Service: Orthopedics;  Laterality: Left;   . TOTAL HIP ARTHROPLASTY Right 07/08/2015  . TOTAL HIP ARTHROPLASTY Right 07/08/2015   Procedure: TOTAL HIP ARTHROPLASTY;  Surgeon: Garald Balding, MD;  Location: [facility];  Service: Orthopedics;  Laterality: Right;     Allergies  Allergen Reactions  . Latex Itching and Rash      Family History  Problem Relation Age of Onset  . Cancer Mother   . Diabetes Mother   . Kidney disease Sister   . Hypertension Other   . Diabetes Other      Social History Ms. Renderos reports that  has never smoked. she has never used smokeless tobacco. Ms. Persad reports that she does not drink alcohol.   Review of Systems CONSTITUTIONAL: No weight loss, fever, chills, weakness or fatigue.  HEENT: Eyes: No visual loss, blurred vision, double vision or yellow sclerae.No hearing loss, sneezing, congestion, runny nose or sore throat.  SKIN: No rash or itching.  CARDIOVASCULAR: per hpi RESPIRATORY: No shortness of breath, cough or sputum.  GASTROINTESTINAL: No anorexia, nausea, vomiting or diarrhea. No abdominal pain or blood.  GENITOURINARY: No burning on urination, no polyuria NEUROLOGICAL: No headache, dizziness, syncope, paralysis, ataxia, numbness or tingling in the extremities. No change in bowel or bladder control.  MUSCULOSKELETAL: No muscle, back pain, joint pain or stiffness.  LYMPHATICS: No enlarged nodes. No history of splenectomy.  PSYCHIATRIC: No history of depression or anxiety.  ENDOCRINOLOGIC: No reports of sweating, cold or heat intolerance. No polyuria or polydipsia.  Marland Kitchen   Physical Examination Vitals:   11/21/17 1304 11/21/17 1311  BP: (!) 148/68 (!) 151/67  Pulse: (!) 56 (!) 54  SpO2: 96%    Vitals:   11/21/17 1304  Weight: 185 lb 6.4 oz (84.1 kg)  Height: 5\' 1"  (1.549 m)    Gen: resting comfortably, no acute distress HEENT: no scleral icterus, pupils equal round and reactive, no palptable cervical adenopathy,  CV: RRR, no m/r/g, no jvd Resp: Clear to auscultation  bilaterally GI: abdomen is soft, non-tender, non-distended, normal bowel sounds, no hepatosplenomegaly MSK: extremities are warm, no edema.  Skin: warm, no rash Neuro:  no focal deficits Psych: appropriate affect   Diagnostic Studies  07/2013 Carotid US Summary: Right: moderate intimal wall thickening CCA. Mild mixed plaque origin ICA. Left: mild sof tplaque distal CCA. Mild calcific plaque origin ICA with acoustic shadowing. Bilateral: 1-395 ICA stenosis. Vertebral artery flow is antegrade. ICA/CCA ratio: R-0.59 L-1.0   02/2016 Lexiscan Nuclear stress  Horizontal ST segment depression after Lexiscan injection. ST segment depression was noted during stress in the II, III, aVF, V5 and V6  leads. Nonspecific finding in setting of severe hypertension  The study is normal. There are no perfusion defects consistent with prior infarction or current ischemia.  This is a low risk study.  The left ventricular ejection fraction is hyperdynamic (>65%).   Assessment and Plan   1. CAD - denies any symptoms, continue current meds   2. HTN - elevated in clinic, given her advanced age and wide pulse pressure would avoid over aggressive lowering of her bp - continue current meds  3. Hyperlpidemia - continue statin    F/u 72months     Arnoldo Lenis, M.D.

## 2017-11-26 ENCOUNTER — Encounter: Payer: Self-pay | Admitting: Cardiology

## 2017-12-13 DIAGNOSIS — I10 Essential (primary) hypertension: Secondary | ICD-10-CM | POA: Diagnosis not present

## 2017-12-13 DIAGNOSIS — K219 Gastro-esophageal reflux disease without esophagitis: Secondary | ICD-10-CM | POA: Diagnosis not present

## 2017-12-13 DIAGNOSIS — E782 Mixed hyperlipidemia: Secondary | ICD-10-CM | POA: Diagnosis not present

## 2017-12-13 DIAGNOSIS — E871 Hypo-osmolality and hyponatremia: Secondary | ICD-10-CM | POA: Diagnosis not present

## 2017-12-13 DIAGNOSIS — E559 Vitamin D deficiency, unspecified: Secondary | ICD-10-CM | POA: Diagnosis not present

## 2017-12-13 DIAGNOSIS — N184 Chronic kidney disease, stage 4 (severe): Secondary | ICD-10-CM | POA: Diagnosis not present

## 2017-12-13 DIAGNOSIS — F3289 Other specified depressive episodes: Secondary | ICD-10-CM | POA: Diagnosis not present

## 2017-12-16 DIAGNOSIS — E871 Hypo-osmolality and hyponatremia: Secondary | ICD-10-CM | POA: Diagnosis not present

## 2017-12-16 DIAGNOSIS — I10 Essential (primary) hypertension: Secondary | ICD-10-CM | POA: Diagnosis not present

## 2017-12-16 DIAGNOSIS — E782 Mixed hyperlipidemia: Secondary | ICD-10-CM | POA: Diagnosis not present

## 2017-12-16 DIAGNOSIS — N184 Chronic kidney disease, stage 4 (severe): Secondary | ICD-10-CM | POA: Diagnosis not present

## 2017-12-16 DIAGNOSIS — Z6833 Body mass index (BMI) 33.0-33.9, adult: Secondary | ICD-10-CM | POA: Diagnosis not present

## 2017-12-16 DIAGNOSIS — I209 Angina pectoris, unspecified: Secondary | ICD-10-CM | POA: Diagnosis not present

## 2017-12-16 DIAGNOSIS — M1612 Unilateral primary osteoarthritis, left hip: Secondary | ICD-10-CM | POA: Diagnosis not present

## 2017-12-16 DIAGNOSIS — M1611 Unilateral primary osteoarthritis, right hip: Secondary | ICD-10-CM | POA: Diagnosis not present

## 2017-12-22 DIAGNOSIS — L509 Urticaria, unspecified: Secondary | ICD-10-CM | POA: Diagnosis not present

## 2017-12-28 DIAGNOSIS — M6281 Muscle weakness (generalized): Secondary | ICD-10-CM | POA: Diagnosis not present

## 2017-12-28 DIAGNOSIS — R262 Difficulty in walking, not elsewhere classified: Secondary | ICD-10-CM | POA: Diagnosis not present

## 2018-01-02 DIAGNOSIS — M6281 Muscle weakness (generalized): Secondary | ICD-10-CM | POA: Diagnosis not present

## 2018-01-02 DIAGNOSIS — R262 Difficulty in walking, not elsewhere classified: Secondary | ICD-10-CM | POA: Diagnosis not present

## 2018-01-09 ENCOUNTER — Other Ambulatory Visit: Payer: Self-pay | Admitting: Cardiology

## 2018-01-09 DIAGNOSIS — R262 Difficulty in walking, not elsewhere classified: Secondary | ICD-10-CM | POA: Diagnosis not present

## 2018-01-09 DIAGNOSIS — M6281 Muscle weakness (generalized): Secondary | ICD-10-CM | POA: Diagnosis not present

## 2018-01-16 DIAGNOSIS — R262 Difficulty in walking, not elsewhere classified: Secondary | ICD-10-CM | POA: Diagnosis not present

## 2018-01-16 DIAGNOSIS — M6281 Muscle weakness (generalized): Secondary | ICD-10-CM | POA: Diagnosis not present

## 2018-01-23 DIAGNOSIS — M6281 Muscle weakness (generalized): Secondary | ICD-10-CM | POA: Diagnosis not present

## 2018-01-23 DIAGNOSIS — R262 Difficulty in walking, not elsewhere classified: Secondary | ICD-10-CM | POA: Diagnosis not present

## 2018-01-28 ENCOUNTER — Other Ambulatory Visit: Payer: Self-pay | Admitting: Cardiology

## 2018-01-30 DIAGNOSIS — M6281 Muscle weakness (generalized): Secondary | ICD-10-CM | POA: Diagnosis not present

## 2018-01-30 DIAGNOSIS — R262 Difficulty in walking, not elsewhere classified: Secondary | ICD-10-CM | POA: Diagnosis not present

## 2018-02-13 DIAGNOSIS — M6281 Muscle weakness (generalized): Secondary | ICD-10-CM | POA: Diagnosis not present

## 2018-02-13 DIAGNOSIS — R262 Difficulty in walking, not elsewhere classified: Secondary | ICD-10-CM | POA: Diagnosis not present

## 2018-03-11 ENCOUNTER — Other Ambulatory Visit: Payer: Self-pay | Admitting: Cardiology

## 2018-03-22 ENCOUNTER — Other Ambulatory Visit: Payer: Self-pay | Admitting: Cardiology

## 2018-04-28 ENCOUNTER — Other Ambulatory Visit: Payer: Self-pay

## 2018-05-09 DIAGNOSIS — M79672 Pain in left foot: Secondary | ICD-10-CM | POA: Diagnosis not present

## 2018-05-09 DIAGNOSIS — M25579 Pain in unspecified ankle and joints of unspecified foot: Secondary | ICD-10-CM | POA: Diagnosis not present

## 2018-05-09 DIAGNOSIS — M79671 Pain in right foot: Secondary | ICD-10-CM | POA: Diagnosis not present

## 2018-05-10 DIAGNOSIS — H6592 Unspecified nonsuppurative otitis media, left ear: Secondary | ICD-10-CM | POA: Diagnosis not present

## 2018-05-10 DIAGNOSIS — D649 Anemia, unspecified: Secondary | ICD-10-CM | POA: Diagnosis not present

## 2018-05-10 DIAGNOSIS — R0602 Shortness of breath: Secondary | ICD-10-CM | POA: Diagnosis not present

## 2018-05-10 DIAGNOSIS — E875 Hyperkalemia: Secondary | ICD-10-CM | POA: Diagnosis not present

## 2018-05-10 DIAGNOSIS — H9202 Otalgia, left ear: Secondary | ICD-10-CM | POA: Diagnosis not present

## 2018-05-10 DIAGNOSIS — I10 Essential (primary) hypertension: Secondary | ICD-10-CM | POA: Diagnosis not present

## 2018-05-10 DIAGNOSIS — M26629 Arthralgia of temporomandibular joint, unspecified side: Secondary | ICD-10-CM | POA: Diagnosis not present

## 2018-05-23 DIAGNOSIS — M79672 Pain in left foot: Secondary | ICD-10-CM | POA: Diagnosis not present

## 2018-05-23 DIAGNOSIS — M79671 Pain in right foot: Secondary | ICD-10-CM | POA: Diagnosis not present

## 2018-05-23 DIAGNOSIS — M25579 Pain in unspecified ankle and joints of unspecified foot: Secondary | ICD-10-CM | POA: Diagnosis not present

## 2018-05-24 ENCOUNTER — Encounter: Payer: Self-pay | Admitting: Cardiology

## 2018-05-24 ENCOUNTER — Ambulatory Visit (INDEPENDENT_AMBULATORY_CARE_PROVIDER_SITE_OTHER): Payer: Medicare Other | Admitting: Cardiology

## 2018-05-24 ENCOUNTER — Other Ambulatory Visit: Payer: Self-pay

## 2018-05-24 VITALS — BP 167/74 | HR 68 | Ht 62.0 in | Wt 189.0 lb

## 2018-05-24 DIAGNOSIS — Z0131 Encounter for examination of blood pressure with abnormal findings: Secondary | ICD-10-CM | POA: Diagnosis not present

## 2018-05-24 DIAGNOSIS — H6991 Unspecified Eustachian tube disorder, right ear: Secondary | ICD-10-CM | POA: Diagnosis not present

## 2018-05-24 DIAGNOSIS — J019 Acute sinusitis, unspecified: Secondary | ICD-10-CM | POA: Diagnosis not present

## 2018-05-24 DIAGNOSIS — I10 Essential (primary) hypertension: Secondary | ICD-10-CM | POA: Diagnosis not present

## 2018-05-24 DIAGNOSIS — E782 Mixed hyperlipidemia: Secondary | ICD-10-CM | POA: Diagnosis not present

## 2018-05-24 DIAGNOSIS — I2510 Atherosclerotic heart disease of native coronary artery without angina pectoris: Secondary | ICD-10-CM | POA: Diagnosis not present

## 2018-05-24 DIAGNOSIS — R0602 Shortness of breath: Secondary | ICD-10-CM

## 2018-05-24 MED ORDER — FUROSEMIDE 20 MG PO TABS: ORAL_TABLET | ORAL | 1 refills | Status: DC

## 2018-05-24 NOTE — Patient Instructions (Addendum)
Your physician wants you to follow-up in: [place] will receive a reminder letter in the mail two months in advance. If you don't receive a letter, please call our office to schedule the follow-up appointment.  Your physician has recommended you make the following change in your medication:   TAKE LASIX 20 MG DAILY - MAY TAKE ADDITIONAL 20 MG AS NEEDED FOR SWELLING  PLEASE CALL AND UPDATE [place] ON YOUR SHORTNESS OF BREATH NEXT WEEK  Thank you for choosing [facility]!!

## 2018-05-24 NOTE — Progress Notes (Signed)
Clinical Summary [name] is a 84 y.o.female seen today for follow up of the following medical problems.    1. CAD - history of stent to RCA in 2004. Nuclear stress negative for ischemia in 2008 and 2012 - echo 07/2013 LVEF 65-70%, - 02/2016 lexiscan without significant ischemia   - no chest pain. Some recent SOB. No recent LE edema. Takes lasix once a day.  - occasional cough at times, occasional wheezing. Ongoing last few weeks.   2. HTN - home bp's 140s-150s/70-80s - compliant with meds   3. Hyperlipidemia - compliant with statin   Past Medical History:  Diagnosis Date  . Anxiety   . Arthritis   . Carotid artery disease ([place])    with an occluded right vertebral artery, otherwise nonobstructive carotid artery disease  . Coronary atherosclerosis of native coronary artery    Status post stent to right coronary 2004, catheterization 2005 nonobstructive CAD, normal LV function, Cardiolite study 2008 negative for ischemia. Residual moderate ostial RCA disease. Stress testing 2012 negative for ischemia ejection fraction 78%  . Cough   . Depression    takes Cymbalta daily  . Diverticulosis   . GERD (gastroesophageal reflux disease)    takes Omeprazole daily  . History of blood transfusion    no abnormal reaction noted  . History of bronchitis    > 33yrs ago   . Joint pain   . Macular degeneration    dry  . Nerve entrapment syndrome of lower extremity     status post prior back surgery and weakness in left leg  . Other and unspecified hyperlipidemia    takes Pravastatin daily  . Peripheral neuropathy   . Peripheral vascular disease, unspecified ([place])    Minimal internal carotid artery disease  . Seizures ([place])    takes Tegretol daily;has only had 1 seizure and that was several yrs ago.  Marland Kitchen Unspecified essential hypertension    takes Lisinopril,Metoprolol,and Imdur daily     Allergies  Allergen Reactions  . Latex Itching and Rash     Current  Outpatient Medications  Medication Sig Dispense Refill  . acetaminophen (TYLENOL) 325 MG tablet Take 650 mg by mouth daily.    Marland Kitchen amLODipine (NORVASC) 10 MG tablet Take 10 mg by mouth daily.    Marland Kitchen amLODipine (NORVASC) 10 MG tablet TAKE 1 TABLET BY MOUTH ONCE DAILY (DOSE  INCREASE) 90 tablet 1  . aspirin EC 81 MG tablet Take 81 mg by mouth daily.    . carbamazepine (TEGRETOL XR) 200 MG 12 hr tablet Take 200 mg by mouth daily.      . diphenhydrAMINE (BENADRYL) 25 MG tablet Take 25 mg by mouth every 6 (six) hours as needed for allergies.    . furosemide (LASIX) 20 MG tablet TAKE 1 TABLET BY MOUTH ONCE DAILY AS NEEDED FOR  SWELLING 90 tablet 0  . isosorbide mononitrate (IMDUR) 120 MG 24 hr tablet Take 120 mg by mouth daily.    Marland Kitchen lisinopril (PRINIVIL,ZESTRIL) 10 MG tablet Take 10 mg by mouth daily.     . metoprolol (LOPRESSOR) 50 MG tablet Take 0.5 tablets (25 mg total) by mouth 2 (two) times daily.    . nitroGLYCERIN (NITROSTAT) 0.4 MG SL tablet DISSOLVE ONE TABLET UNDER THE TONGUE EVERY 5 MINUTES AS NEEDED FOR CHEST PAIN.  DO NOT EXCEED A TOTAL OF 3 DOSES IN 15 MINUTES 75 tablet 0  . omeprazole (PRILOSEC) 20 MG capsule Take 20 mg by mouth daily. OTC    .  pravastatin (PRAVACHOL) 80 MG tablet Take 1 tablet (80 mg total) by mouth every evening. 90 tablet 3  . traMADol (ULTRAM) 50 MG tablet Take 50 mg by mouth as needed.      No current facility-administered medications for this visit.      Past Surgical History:  Procedure Laterality Date  . BACK SURGERY  10/14  . CARDIAC CATHETERIZATION     nonobstructive coronary artery disease  . CATARACT EXTRACTION    . COLONOSCOPY    . CORONARY ANGIOPLASTY  [id number]  . KIDNEY SURGERY  2010   1/4 of right kidney removed [facility]  . KNEE ARTHROPLASTY Left 2004  . PARTIAL HYSTERECTOMY    . TOTAL HIP ARTHROPLASTY Left 12/24/2014   Procedure: TOTAL HIP ARTHROPLASTY;  Surgeon: Carrie Balding, MD;  Location: [place];  Service: Orthopedics;  Laterality: Left;    . TOTAL HIP ARTHROPLASTY Right 07/08/2015  . TOTAL HIP ARTHROPLASTY Right 07/08/2015   Procedure: TOTAL HIP ARTHROPLASTY;  Surgeon: Carrie Balding, MD;  Location: [facility];  Service: Orthopedics;  Laterality: Right;     Allergies  Allergen Reactions  . Latex Itching and Rash      Family History  Problem Relation Age of Onset  . Cancer Mother   . Diabetes Mother   . Kidney disease Sister   . Hypertension Other   . Diabetes Other      Social History Carrie Gillespie reports that she has never smoked. She has never used smokeless tobacco. Carrie Gillespie reports that she does not drink alcohol.   Review of Systems CONSTITUTIONAL: No weight loss, fever, chills, weakness or fatigue.  HEENT: Eyes: No visual loss, blurred vision, double vision or yellow sclerae.No hearing loss, sneezing, congestion, runny nose or sore throat.  SKIN: No rash or itching.  CARDIOVASCULAR: per hpi RESPIRATORY: per hpi GASTROINTESTINAL: No anorexia, nausea, vomiting or diarrhea. No abdominal pain or blood.  GENITOURINARY: No burning on urination, no polyuria NEUROLOGICAL: No headache, dizziness, syncope, paralysis, ataxia, numbness or tingling in the extremities. No change in bowel or bladder control.  MUSCULOSKELETAL: No muscle, back pain, joint pain or stiffness.  LYMPHATICS: No enlarged nodes. No history of splenectomy.  PSYCHIATRIC: No history of depression or anxiety.  ENDOCRINOLOGIC: No reports of sweating, cold or heat intolerance. No polyuria or polydipsia.  Marland Kitchen   Physical Examination Vitals:   05/24/18 1435  BP: (!) 167/74  Pulse: 68  SpO2: 97%   Vitals:   05/24/18 1435  Weight: 189 lb (85.7 kg)  Height: 5\' 2"  (1.575 m)    Gen: resting comfortably, no acute distress HEENT: no scleral icterus, pupils equal round and reactive, no palptable cervical adenopathy,  CV: RRR no mr/g, no jvd Resp: Clear to auscultation bilaterally GI: abdomen is soft, non-tender, non-distended, normal bowel  sounds, no hepatosplenomegaly MSK: extremities are warm, no edema.  Skin: warm, no rash Neuro:  no focal deficits Psych: appropriate affect   Diagnostic Studies  07/2013 Carotid US Summary: Right: moderate intimal wall thickening CCA. Mild mixed plaque origin ICA. Left: mild sof tplaque distal CCA. Mild calcific plaque origin ICA with acoustic shadowing. Bilateral: 1-395 ICA stenosis. Vertebral artery flow is antegrade. ICA/CCA ratio: R-0.59 L-1.0   02/2016 Lexiscan Nuclear stress  Horizontal ST segment depression after Lexiscan injection. ST segment depression was noted during stress in the II, III, aVF, V5 and V6 leads. Nonspecific finding in setting of severe hypertension  The study is normal. There are no perfusion defects consistent with prior infarction or  current ischemia.  This is a low risk study.  The left ventricular ejection fraction is hyperdynamic (>65%).   Assessment and Plan   1. CAD -no recent chest pain, some mild SOB. Mild LE edema, she will try taking lasix 40mg  daily x 3 days then resume 20mg  daily. Update [place] on her symptoms next week - EKG today shows SR, no acute ischemic changes  2. HTN - manual recheck of bp 144/70. Reasonable for her given her advanced age, continue current meds.  3. Hyperlpidemia - cwe will continue statin, request labs from pcp   F/u 48months     Arnoldo Lenis, M.D

## 2018-05-28 DIAGNOSIS — M26601 Right temporomandibular joint disorder, unspecified: Secondary | ICD-10-CM | POA: Diagnosis not present

## 2018-05-28 DIAGNOSIS — H6501 Acute serous otitis media, right ear: Secondary | ICD-10-CM | POA: Diagnosis not present

## 2018-05-30 ENCOUNTER — Encounter: Payer: Self-pay | Admitting: *Deleted

## 2018-06-13 DIAGNOSIS — M25579 Pain in unspecified ankle and joints of unspecified foot: Secondary | ICD-10-CM | POA: Diagnosis not present

## 2018-06-13 DIAGNOSIS — M79671 Pain in right foot: Secondary | ICD-10-CM | POA: Diagnosis not present

## 2018-06-13 DIAGNOSIS — M79672 Pain in left foot: Secondary | ICD-10-CM | POA: Diagnosis not present

## 2018-06-19 DIAGNOSIS — K219 Gastro-esophageal reflux disease without esophagitis: Secondary | ICD-10-CM | POA: Diagnosis not present

## 2018-06-19 DIAGNOSIS — E871 Hypo-osmolality and hyponatremia: Secondary | ICD-10-CM | POA: Diagnosis not present

## 2018-06-19 DIAGNOSIS — N184 Chronic kidney disease, stage 4 (severe): Secondary | ICD-10-CM | POA: Diagnosis not present

## 2018-06-19 DIAGNOSIS — I10 Essential (primary) hypertension: Secondary | ICD-10-CM | POA: Diagnosis not present

## 2018-06-19 DIAGNOSIS — E782 Mixed hyperlipidemia: Secondary | ICD-10-CM | POA: Diagnosis not present

## 2018-06-19 DIAGNOSIS — G40802 Other epilepsy, not intractable, without status epilepticus: Secondary | ICD-10-CM | POA: Diagnosis not present

## 2018-06-21 DIAGNOSIS — Z96641 Presence of right artificial hip joint: Secondary | ICD-10-CM | POA: Diagnosis not present

## 2018-06-21 DIAGNOSIS — G40802 Other epilepsy, not intractable, without status epilepticus: Secondary | ICD-10-CM | POA: Diagnosis not present

## 2018-06-21 DIAGNOSIS — Z23 Encounter for immunization: Secondary | ICD-10-CM | POA: Diagnosis not present

## 2018-06-21 DIAGNOSIS — Z96642 Presence of left artificial hip joint: Secondary | ICD-10-CM | POA: Diagnosis not present

## 2018-06-21 DIAGNOSIS — I10 Essential (primary) hypertension: Secondary | ICD-10-CM | POA: Diagnosis not present

## 2018-06-21 DIAGNOSIS — Z6834 Body mass index (BMI) 34.0-34.9, adult: Secondary | ICD-10-CM | POA: Diagnosis not present

## 2018-06-21 DIAGNOSIS — N184 Chronic kidney disease, stage 4 (severe): Secondary | ICD-10-CM | POA: Diagnosis not present

## 2018-06-21 DIAGNOSIS — E782 Mixed hyperlipidemia: Secondary | ICD-10-CM | POA: Diagnosis not present

## 2018-07-12 DIAGNOSIS — K219 Gastro-esophageal reflux disease without esophagitis: Secondary | ICD-10-CM | POA: Diagnosis not present

## 2018-07-12 DIAGNOSIS — J9811 Atelectasis: Secondary | ICD-10-CM | POA: Diagnosis not present

## 2018-07-12 DIAGNOSIS — M79602 Pain in left arm: Secondary | ICD-10-CM | POA: Diagnosis not present

## 2018-07-12 DIAGNOSIS — R079 Chest pain, unspecified: Secondary | ICD-10-CM | POA: Diagnosis not present

## 2018-07-12 DIAGNOSIS — I2510 Atherosclerotic heart disease of native coronary artery without angina pectoris: Secondary | ICD-10-CM | POA: Diagnosis not present

## 2018-07-12 DIAGNOSIS — M25512 Pain in left shoulder: Secondary | ICD-10-CM | POA: Diagnosis not present

## 2018-07-12 DIAGNOSIS — Z8249 Family history of ischemic heart disease and other diseases of the circulatory system: Secondary | ICD-10-CM | POA: Diagnosis not present

## 2018-07-12 DIAGNOSIS — Z955 Presence of coronary angioplasty implant and graft: Secondary | ICD-10-CM | POA: Diagnosis not present

## 2018-07-12 DIAGNOSIS — I119 Hypertensive heart disease without heart failure: Secondary | ICD-10-CM | POA: Diagnosis not present

## 2018-07-12 DIAGNOSIS — E785 Hyperlipidemia, unspecified: Secondary | ICD-10-CM | POA: Diagnosis not present

## 2018-07-12 DIAGNOSIS — M542 Cervicalgia: Secondary | ICD-10-CM | POA: Diagnosis not present

## 2018-07-12 DIAGNOSIS — Z7982 Long term (current) use of aspirin: Secondary | ICD-10-CM | POA: Diagnosis not present

## 2018-07-12 DIAGNOSIS — Z79899 Other long term (current) drug therapy: Secondary | ICD-10-CM | POA: Diagnosis not present

## 2018-07-12 DIAGNOSIS — Z905 Acquired absence of kidney: Secondary | ICD-10-CM | POA: Diagnosis not present

## 2018-07-12 DIAGNOSIS — Z96643 Presence of artificial hip joint, bilateral: Secondary | ICD-10-CM | POA: Diagnosis not present

## 2018-07-12 DIAGNOSIS — G40909 Epilepsy, unspecified, not intractable, without status epilepticus: Secondary | ICD-10-CM | POA: Diagnosis not present

## 2018-07-13 ENCOUNTER — Encounter: Payer: Self-pay | Admitting: Cardiology

## 2018-07-13 ENCOUNTER — Telehealth: Payer: Self-pay | Admitting: Cardiology

## 2018-07-13 NOTE — Telephone Encounter (Signed)
Son informed.

## 2018-07-13 NOTE — Telephone Encounter (Signed)
Patient was seen in the ER [facility] 07-12-2018 with neck and left arm pain. Was told by the ER to see cardiology doctor.  Appointment set with BAhmed Prima. Family is concerned about her having to wait.  Carrie Gillespie - son   [phone] .

## 2018-07-13 NOTE — Telephone Encounter (Signed)
Can do 940 on Nov 6  J Ehsan Corvin MD

## 2018-07-19 ENCOUNTER — Encounter (INDEPENDENT_AMBULATORY_CARE_PROVIDER_SITE_OTHER): Payer: Medicare Other | Admitting: Ophthalmology

## 2018-07-19 DIAGNOSIS — H35033 Hypertensive retinopathy, bilateral: Secondary | ICD-10-CM

## 2018-07-19 DIAGNOSIS — H353134 Nonexudative age-related macular degeneration, bilateral, advanced atrophic with subfoveal involvement: Secondary | ICD-10-CM | POA: Diagnosis not present

## 2018-07-19 DIAGNOSIS — I10 Essential (primary) hypertension: Secondary | ICD-10-CM | POA: Diagnosis not present

## 2018-07-19 DIAGNOSIS — H43813 Vitreous degeneration, bilateral: Secondary | ICD-10-CM

## 2018-07-19 DIAGNOSIS — D3132 Benign neoplasm of left choroid: Secondary | ICD-10-CM

## 2018-08-01 DIAGNOSIS — H9203 Otalgia, bilateral: Secondary | ICD-10-CM | POA: Diagnosis not present

## 2018-08-01 DIAGNOSIS — H6123 Impacted cerumen, bilateral: Secondary | ICD-10-CM | POA: Diagnosis not present

## 2018-08-01 DIAGNOSIS — M792 Neuralgia and neuritis, unspecified: Secondary | ICD-10-CM | POA: Diagnosis not present

## 2018-08-02 ENCOUNTER — Ambulatory Visit (INDEPENDENT_AMBULATORY_CARE_PROVIDER_SITE_OTHER): Payer: Medicare Other | Admitting: Cardiology

## 2018-08-02 ENCOUNTER — Encounter: Payer: Self-pay | Admitting: Cardiology

## 2018-08-02 VITALS — BP 140/68 | HR 62 | Ht 62.0 in | Wt 189.0 lb

## 2018-08-02 DIAGNOSIS — R0789 Other chest pain: Secondary | ICD-10-CM | POA: Diagnosis not present

## 2018-08-02 DIAGNOSIS — I2510 Atherosclerotic heart disease of native coronary artery without angina pectoris: Secondary | ICD-10-CM

## 2018-08-02 NOTE — Patient Instructions (Signed)

## 2018-08-02 NOTE — Progress Notes (Signed)
Clinical Summary Ms. Sauve is a 84 y.o.female seen today for follow up of the following medical problems. This is a focused visit on a recent ER visit with neck and shoulder pain.    1. CAD - history of stent to RCA in 2004. Nuclear stress negative for ischemia in 2008 and 2012 - echo 07/2013 LVEF 65-70%, - 02/2016 lexiscan without significant ischemia   - no chest pain. Some recent SOB. No recent LE edema. Takes lasix once a day.  - occasional cough at times, occasional wheezing. Ongoing last few weeks.    - seen at [facility] ER with neck and should pain 06/2018 - Trop neg x 2, EKG SR without acute ischemic changes. CXR no acute findings.  - pain in neck into left shoulder and arm. Sharp pain, lastsing a few a seconds at a time. No other associated symptoms.      Past Medical History:  Diagnosis Date  . Anxiety   . Arthritis   . Carotid artery disease ([place])    with an occluded right vertebral artery, otherwise nonobstructive carotid artery disease  . Coronary atherosclerosis of native coronary artery    Status post stent to right coronary 2004, catheterization 2005 nonobstructive CAD, normal LV function, Cardiolite study 2008 negative for ischemia. Residual moderate ostial RCA disease. Stress testing 2012 negative for ischemia ejection fraction 78%  . Cough   . Depression    takes Cymbalta daily  . Diverticulosis   . GERD (gastroesophageal reflux disease)    takes Omeprazole daily  . History of blood transfusion    no abnormal reaction noted  . History of bronchitis    > 50yrs ago   . Joint pain   . Macular degeneration    dry  . Nerve entrapment syndrome of lower extremity     status post prior back surgery and weakness in left leg  . Other and unspecified hyperlipidemia    takes Pravastatin daily  . Peripheral neuropathy   . Peripheral vascular disease, unspecified ([place])    Minimal internal carotid artery disease  . Seizures ([place])    takes Tegretol  daily;has only had 1 seizure and that was several yrs ago.  Marland Kitchen Unspecified essential hypertension    takes Lisinopril,Metoprolol,and Imdur daily     Allergies  Allergen Reactions  . Latex Itching and Rash     Current Outpatient Medications  Medication Sig Dispense Refill  . acetaminophen (TYLENOL) 325 MG tablet Take 650 mg by mouth daily.    Marland Kitchen amLODipine (NORVASC) 10 MG tablet Take 10 mg by mouth daily.    Marland Kitchen amLODipine (NORVASC) 10 MG tablet TAKE 1 TABLET BY MOUTH ONCE DAILY (DOSE  INCREASE) 90 tablet 1  . aspirin EC 81 MG tablet Take 81 mg by mouth daily.    . carbamazepine (TEGRETOL XR) 200 MG 12 hr tablet Take 200 mg by mouth daily.      . diphenhydrAMINE (BENADRYL) 25 MG tablet Take 25 mg by mouth every 6 (six) hours as needed for allergies.    . furosemide (LASIX) 20 MG tablet TAKE 1 TABLET DAILY - MAY TAKE ADDITIONAL TABLET AS NEEDED 180 tablet 1  . isosorbide mononitrate (IMDUR) 120 MG 24 hr tablet Take 120 mg by mouth daily.    Marland Kitchen lisinopril (PRINIVIL,ZESTRIL) 10 MG tablet Take 10 mg by mouth daily.     . metoprolol (LOPRESSOR) 50 MG tablet Take 0.5 tablets (25 mg total) by mouth 2 (two) times daily.    Marland Kitchen  nitroGLYCERIN (NITROSTAT) 0.4 MG SL tablet DISSOLVE ONE TABLET UNDER THE TONGUE EVERY 5 MINUTES AS NEEDED FOR CHEST PAIN.  DO NOT EXCEED A TOTAL OF 3 DOSES IN 15 MINUTES 75 tablet 0  . omeprazole (PRILOSEC) 20 MG capsule Take 20 mg by mouth daily. OTC    . pravastatin (PRAVACHOL) 80 MG tablet Take 80 mg by mouth daily.    . traMADol (ULTRAM) 50 MG tablet Take 50 mg by mouth as needed.      No current facility-administered medications for this visit.      Past Surgical History:  Procedure Laterality Date  . BACK SURGERY  10/14  . CARDIAC CATHETERIZATION     nonobstructive coronary artery disease  . CATARACT EXTRACTION    . COLONOSCOPY    . CORONARY ANGIOPLASTY  [id number]  . KIDNEY SURGERY  2010   1/4 of right kidney removed [facility]  . KNEE ARTHROPLASTY Left 2004  .  PARTIAL HYSTERECTOMY    . TOTAL HIP ARTHROPLASTY Left 12/24/2014   Procedure: TOTAL HIP ARTHROPLASTY;  Surgeon: Garald Balding, MD;  Location: [place];  Service: Orthopedics;  Laterality: Left;  . TOTAL HIP ARTHROPLASTY Right 07/08/2015  . TOTAL HIP ARTHROPLASTY Right 07/08/2015   Procedure: TOTAL HIP ARTHROPLASTY;  Surgeon: Garald Balding, MD;  Location: [place];  Service: Orthopedics;  Laterality: Right;     Allergies  Allergen Reactions  . Latex Itching and Rash      Family History  Problem Relation Age of Onset  . Cancer Mother   . Diabetes Mother   . Kidney disease Sister   . Hypertension Other   . Diabetes Other      Social History Ms. Dolinar reports that she has never smoked. She has never used smokeless tobacco. Ms. Rey reports that she does not drink alcohol.   Review of Systems CONSTITUTIONAL: No weight loss, fever, chills, weakness or fatigue.  HEENT: Eyes: No visual loss, blurred vision, double vision or yellow sclerae.No hearing loss, sneezing, congestion, runny nose or sore throat.  SKIN: No rash or itching.  CARDIOVASCULAR: per hpi RESPIRATORY: No shortness of breath, cough or sputum.  GASTROINTESTINAL: No anorexia, nausea, vomiting or diarrhea. No abdominal pain or blood.  GENITOURINARY: No burning on urination, no polyuria NEUROLOGICAL: No headache, dizziness, syncope, paralysis, ataxia, numbness or tingling in the extremities. No change in bowel or bladder control.  MUSCULOSKELETAL: per hpi LYMPHATICS: No enlarged nodes. No history of splenectomy.  PSYCHIATRIC: No history of depression or anxiety.  ENDOCRINOLOGIC: No reports of sweating, cold or heat intolerance. No polyuria or polydipsia.  Marland Kitchen   Physical Examination Vitals:   08/02/18 0946  BP: 140/68  Pulse: 62  SpO2: 96%   Vitals:   08/02/18 0946  Weight: 189 lb (85.7 kg)  Height: 5\' 2"  (1.575 m)    Gen: resting comfortably, no acute distress HEENT: no scleral icterus, pupils equal  round and reactive, no palptable cervical adenopathy,  CV: RRR, no m/r/g, no jvd Resp: Clear to auscultation bilaterally GI: abdomen is soft, non-tender, non-distended, normal bowel sounds, no hepatosplenomegaly MSK: extremities are warm, no edema.  Skin: warm, no rash Neuro:  no focal deficits Psych: appropriate affect   Diagnostic Studies  07/2013 Carotid US Summary: Right: moderate intimal wall thickening CCA. Mild mixed plaque origin ICA. Left: mild sof tplaque distal CCA. Mild calcific plaque origin ICA with acoustic shadowing. Bilateral: 1-395 ICA stenosis. Vertebral artery flow is antegrade. ICA/CCA ratio: R-0.59 L-1.0   02/2016 Lexiscan Nuclear stress  Horizontal ST segment depression after Lexiscan injection. ST segment depression was noted during stress in the II, III, aVF, V5 and V6 leads. Nonspecific finding in setting of severe hypertension  The study is normal. There are no perfusion defects consistent with prior infarction or current ischemia.  This is a low risk study.  The left ventricular ejection fraction is hyperdynamic (>65%).   Assessment and Plan  1. CAD -recent noncardiac pain, involving neck, left should and arm without associated symptoms. Negative workup in ER for ACS - no recurrent symptoms - negative stress test 02/2016 - no further cardiac testing planned at this time  F/u 6 months      Arnoldo Lenis, M.D

## 2018-08-08 ENCOUNTER — Ambulatory Visit: Payer: Medicare Other | Admitting: Student

## 2018-08-12 ENCOUNTER — Other Ambulatory Visit: Payer: Self-pay | Admitting: Cardiology

## 2018-08-14 ENCOUNTER — Other Ambulatory Visit: Payer: Self-pay

## 2018-09-08 DIAGNOSIS — R32 Unspecified urinary incontinence: Secondary | ICD-10-CM | POA: Diagnosis not present

## 2018-09-08 DIAGNOSIS — R0902 Hypoxemia: Secondary | ICD-10-CM | POA: Diagnosis not present

## 2018-09-08 DIAGNOSIS — I10 Essential (primary) hypertension: Secondary | ICD-10-CM | POA: Diagnosis not present

## 2018-09-08 DIAGNOSIS — K5790 Diverticulosis of intestine, part unspecified, without perforation or abscess without bleeding: Secondary | ICD-10-CM | POA: Diagnosis not present

## 2018-09-08 DIAGNOSIS — J9811 Atelectasis: Secondary | ICD-10-CM | POA: Diagnosis not present

## 2018-09-08 DIAGNOSIS — K8020 Calculus of gallbladder without cholecystitis without obstruction: Secondary | ICD-10-CM | POA: Diagnosis not present

## 2018-09-08 DIAGNOSIS — R112 Nausea with vomiting, unspecified: Secondary | ICD-10-CM | POA: Diagnosis not present

## 2018-09-08 DIAGNOSIS — E7800 Pure hypercholesterolemia, unspecified: Secondary | ICD-10-CM | POA: Diagnosis not present

## 2018-09-08 DIAGNOSIS — R1111 Vomiting without nausea: Secondary | ICD-10-CM | POA: Diagnosis not present

## 2018-09-08 DIAGNOSIS — Z79899 Other long term (current) drug therapy: Secondary | ICD-10-CM | POA: Diagnosis not present

## 2018-09-08 DIAGNOSIS — Z7982 Long term (current) use of aspirin: Secondary | ICD-10-CM | POA: Diagnosis not present

## 2018-09-08 DIAGNOSIS — R110 Nausea: Secondary | ICD-10-CM | POA: Diagnosis not present

## 2018-09-08 DIAGNOSIS — E871 Hypo-osmolality and hyponatremia: Secondary | ICD-10-CM | POA: Diagnosis not present

## 2018-09-08 DIAGNOSIS — R1110 Vomiting, unspecified: Secondary | ICD-10-CM | POA: Diagnosis not present

## 2018-09-08 DIAGNOSIS — K529 Noninfective gastroenteritis and colitis, unspecified: Secondary | ICD-10-CM | POA: Diagnosis not present

## 2018-09-08 DIAGNOSIS — E860 Dehydration: Secondary | ICD-10-CM | POA: Diagnosis not present

## 2018-09-13 DIAGNOSIS — D638 Anemia in other chronic diseases classified elsewhere: Secondary | ICD-10-CM | POA: Diagnosis not present

## 2018-09-13 DIAGNOSIS — K8020 Calculus of gallbladder without cholecystitis without obstruction: Secondary | ICD-10-CM | POA: Diagnosis not present

## 2018-09-13 DIAGNOSIS — R1110 Vomiting, unspecified: Secondary | ICD-10-CM | POA: Diagnosis not present

## 2018-09-13 DIAGNOSIS — E871 Hypo-osmolality and hyponatremia: Secondary | ICD-10-CM | POA: Diagnosis not present

## 2018-09-13 DIAGNOSIS — N184 Chronic kidney disease, stage 4 (severe): Secondary | ICD-10-CM | POA: Diagnosis not present

## 2018-09-13 DIAGNOSIS — Z6833 Body mass index (BMI) 33.0-33.9, adult: Secondary | ICD-10-CM | POA: Diagnosis not present

## 2018-09-19 DIAGNOSIS — Z0131 Encounter for examination of blood pressure with abnormal findings: Secondary | ICD-10-CM | POA: Diagnosis not present

## 2018-09-19 DIAGNOSIS — J019 Acute sinusitis, unspecified: Secondary | ICD-10-CM | POA: Diagnosis not present

## 2018-09-19 DIAGNOSIS — H9201 Otalgia, right ear: Secondary | ICD-10-CM | POA: Diagnosis not present

## 2018-10-05 DIAGNOSIS — H9203 Otalgia, bilateral: Secondary | ICD-10-CM | POA: Diagnosis not present

## 2018-10-05 DIAGNOSIS — M792 Neuralgia and neuritis, unspecified: Secondary | ICD-10-CM | POA: Diagnosis not present

## 2018-12-07 ENCOUNTER — Encounter (INDEPENDENT_AMBULATORY_CARE_PROVIDER_SITE_OTHER): Payer: Medicare Other | Admitting: Ophthalmology

## 2018-12-08 ENCOUNTER — Encounter (INDEPENDENT_AMBULATORY_CARE_PROVIDER_SITE_OTHER): Payer: Medicare Other | Admitting: Ophthalmology

## 2018-12-08 ENCOUNTER — Other Ambulatory Visit: Payer: Self-pay

## 2018-12-08 DIAGNOSIS — N189 Chronic kidney disease, unspecified: Secondary | ICD-10-CM | POA: Diagnosis not present

## 2018-12-08 DIAGNOSIS — H43813 Vitreous degeneration, bilateral: Secondary | ICD-10-CM

## 2018-12-08 DIAGNOSIS — K219 Gastro-esophageal reflux disease without esophagitis: Secondary | ICD-10-CM | POA: Diagnosis not present

## 2018-12-08 DIAGNOSIS — D3132 Benign neoplasm of left choroid: Secondary | ICD-10-CM

## 2018-12-08 DIAGNOSIS — R5382 Chronic fatigue, unspecified: Secondary | ICD-10-CM | POA: Diagnosis not present

## 2018-12-08 DIAGNOSIS — I10 Essential (primary) hypertension: Secondary | ICD-10-CM

## 2018-12-08 DIAGNOSIS — N182 Chronic kidney disease, stage 2 (mild): Secondary | ICD-10-CM | POA: Diagnosis not present

## 2018-12-08 DIAGNOSIS — N184 Chronic kidney disease, stage 4 (severe): Secondary | ICD-10-CM | POA: Diagnosis not present

## 2018-12-08 DIAGNOSIS — H35033 Hypertensive retinopathy, bilateral: Secondary | ICD-10-CM

## 2018-12-08 DIAGNOSIS — E782 Mixed hyperlipidemia: Secondary | ICD-10-CM | POA: Diagnosis not present

## 2018-12-08 DIAGNOSIS — E559 Vitamin D deficiency, unspecified: Secondary | ICD-10-CM | POA: Diagnosis not present

## 2018-12-08 DIAGNOSIS — D638 Anemia in other chronic diseases classified elsewhere: Secondary | ICD-10-CM | POA: Diagnosis not present

## 2018-12-08 DIAGNOSIS — H353134 Nonexudative age-related macular degeneration, bilateral, advanced atrophic with subfoveal involvement: Secondary | ICD-10-CM | POA: Diagnosis not present

## 2018-12-08 DIAGNOSIS — G40802 Other epilepsy, not intractable, without status epilepticus: Secondary | ICD-10-CM | POA: Diagnosis not present

## 2018-12-14 DIAGNOSIS — E559 Vitamin D deficiency, unspecified: Secondary | ICD-10-CM | POA: Diagnosis not present

## 2018-12-14 DIAGNOSIS — E871 Hypo-osmolality and hyponatremia: Secondary | ICD-10-CM | POA: Diagnosis not present

## 2018-12-14 DIAGNOSIS — I10 Essential (primary) hypertension: Secondary | ICD-10-CM | POA: Diagnosis not present

## 2018-12-14 DIAGNOSIS — Z6833 Body mass index (BMI) 33.0-33.9, adult: Secondary | ICD-10-CM | POA: Diagnosis not present

## 2018-12-14 DIAGNOSIS — H3530 Unspecified macular degeneration: Secondary | ICD-10-CM | POA: Diagnosis not present

## 2018-12-14 DIAGNOSIS — G40802 Other epilepsy, not intractable, without status epilepticus: Secondary | ICD-10-CM | POA: Diagnosis not present

## 2018-12-14 DIAGNOSIS — D638 Anemia in other chronic diseases classified elsewhere: Secondary | ICD-10-CM | POA: Diagnosis not present

## 2018-12-14 DIAGNOSIS — N183 Chronic kidney disease, stage 3 (moderate): Secondary | ICD-10-CM | POA: Diagnosis not present

## 2019-02-13 ENCOUNTER — Telehealth: Payer: Self-pay | Admitting: *Deleted

## 2019-02-13 NOTE — Telephone Encounter (Signed)
Patient verbally consented for tele-health visits with [facility] and understands that her insurance company will be billed for the encounter.  Aware to have vitals available   

## 2019-02-14 ENCOUNTER — Telehealth: Payer: Medicare HMO | Admitting: Cardiology

## 2019-02-14 NOTE — Progress Notes (Unsigned)
{Choose 1 Note Type (Telehealth Visit or Telephone Visit):[phone]}   Date:  02/14/2019   ID:  Carrie Gillespie, DOB Mar 11, 1920, MRN [id number]  {Patient [phone]::"Home"} {Provider Location:[phone]::"Home"}  PCP:  Curlene Labrum, MD  Cardiologist:  Carlyle Dolly, MD *** Electrophysiologist:  None   Evaluation Performed:  {Choose Visit [phone]::"Follow-Up Visit"}  Chief Complaint:  ***  History of Present Illness:    Carrie Gillespie is a 85 y.o. female with ***   1. CAD - history of stent to RCA in 2004. Nuclear stress negative for ischemia in 2008 and 2012 - echo 07/2013 LVEF 65-70%, - 02/2016 lexiscan without significant ischemia   - no chest pain. Some recent SOB. No recent LE edema. Takes lasix once a day.  - occasional cough at times, occasional wheezing. Ongoing last few weeks.   - seen at [facility] ER with neck and should pain 06/2018 - Trop neg x 2, EKG SR without acute ischemic changes. CXR no acute findings.  - pain in neck into left shoulder and arm. Sharp pain, lastsing a few a seconds at a time. No other associated symptoms.    2. HTN - home bp's 140s-150s/70-80s - compliant with meds   3. Hyperlipidemia - compliant with statin  The patient {does/does not:200015} have symptoms concerning for COVID-19 infection (fever, chills, cough, or new shortness of breath).    Past Medical History:  Diagnosis Date  . Anxiety   . Arthritis   . Carotid artery disease ([place])    with an occluded right vertebral artery, otherwise nonobstructive carotid artery disease  . Coronary atherosclerosis of native coronary artery    Status post stent to right coronary 2004, catheterization 2005 nonobstructive CAD, normal LV function, Cardiolite study 2008 negative for ischemia. Residual moderate ostial RCA disease. Stress testing 2012 negative for ischemia ejection fraction 78%  . Cough   . Depression    takes Cymbalta daily  . Diverticulosis    . GERD (gastroesophageal reflux disease)    takes Omeprazole daily  . History of blood transfusion    no abnormal reaction noted  . History of bronchitis    > 54yrs ago   . Joint pain   . Macular degeneration    dry  . Nerve entrapment syndrome of lower extremity     status post prior back surgery and weakness in left leg  . Other and unspecified hyperlipidemia    takes Pravastatin daily  . Peripheral neuropathy   . Peripheral vascular disease, unspecified ([place])    Minimal internal carotid artery disease  . Seizures ([place])    takes Tegretol daily;has only had 1 seizure and that was several yrs ago.  Marland Kitchen Unspecified essential hypertension    takes Lisinopril,Metoprolol,and Imdur daily   Past Surgical History:  Procedure Laterality Date  . BACK SURGERY  10/14  . CARDIAC CATHETERIZATION     nonobstructive coronary artery disease  . CATARACT EXTRACTION    . COLONOSCOPY    . CORONARY ANGIOPLASTY  [id number]  . KIDNEY SURGERY  2010   1/4 of right kidney removed [facility]  . KNEE ARTHROPLASTY Left 2004  . PARTIAL HYSTERECTOMY    . TOTAL HIP ARTHROPLASTY Left 12/24/2014   Procedure: TOTAL HIP ARTHROPLASTY;  Surgeon: Garald Balding, MD;  Location: [place];  Service: Orthopedics;  Laterality: Left;  . TOTAL HIP ARTHROPLASTY Right 07/08/2015  . TOTAL HIP ARTHROPLASTY Right 07/08/2015   Procedure: TOTAL HIP ARTHROPLASTY;  Surgeon: Garald Balding, MD;  Location: [place];  Service: Orthopedics;  Laterality: Right;     No outpatient medications have been marked as taking for the 02/14/19 encounter (Appointment) with Arnoldo Lenis, MD.     Allergies:   Latex   Social History   Tobacco Use  . Smoking status: Never Smoker  . Smokeless tobacco: Never Used  Substance Use Topics  . Alcohol use: No    Alcohol/week: 0.0 standard drinks  . Drug use: No     Family Hx: The patient's family history includes Cancer in her mother; Diabetes in her mother and another family member;  Hypertension in an other family member; Kidney disease in her sister.  ROS:   Please see the history of present illness.    *** All other systems reviewed and are negative.   Prior CV studies:   The following studies were reviewed today:  07/2013 Carotid US Summary: Right: moderate intimal wall thickening CCA. Mild mixed plaque origin ICA. Left: mild sof tplaque distal CCA. Mild calcific plaque origin ICA with acoustic shadowing. Bilateral: 1-395 ICA stenosis. Vertebral artery flow is antegrade. ICA/CCA ratio: R-0.59 L-1.0   02/2016 Lexiscan Nuclear stress  Horizontal ST segment depression after Lexiscan injection. ST segment depression was noted during stress in the II, III, aVF, V5 and V6 leads. Nonspecific finding in setting of severe hypertension  The study is normal. There are no perfusion defects consistent with prior infarction or current ischemia.  This is a low risk study.  The left ventricular ejection fraction is hyperdynamic (>65%).  Labs/Other Tests and Data Reviewed:    EKG:  {EKG/Telemetry Strips Reviewed:[phone]}  Recent Labs: No results found for requested labs within last 8760 hours.   Recent Lipid Panel No results found for: CHOL, TRIG, HDL, CHOLHDL, LDLCALC, LDLDIRECT  Wt Readings from Last 3 Encounters:  08/02/18 189 lb (85.7 kg)  05/24/18 189 lb (85.7 kg)  11/21/17 185 lb 6.4 oz (84.1 kg)     Objective:    Vital Signs:  There were no vitals taken for this visit.   {[facility] Virtual Exam (Optional):[phone]::"VITAL SIGNS:  reviewed"}  ASSESSMENT & PLAN:    1. CAD -recent noncardiac pain, involving neck, left should and arm without associated symptoms. Negative workup in ER for ACS - no recurrent symptoms - negative stress test 02/2016 - no further cardiac testing planned at this time    2. HTN -manual recheck of bp 144/70. Reasonable for her given her advanced age, continue current meds.  3. Hyperlpidemia - cwe will  continue statin, request labs from pcp    F/u 6 months    COVID-19 Education: The signs and symptoms of COVID-19 were discussed with the patient and how to seek care for testing (follow up with PCP or arrange E-visit).  ***The importance of social distancing was discussed today.  Time:   Today, I have spent *** minutes with the patient with telehealth technology discussing the above problems.     Medication Adjustments/Labs and Tests Ordered: Current medicines are reviewed at length with the patient today.  Concerns regarding medicines are outlined above.   Tests Ordered: No orders of the defined types were placed in this encounter.   Medication Changes: No orders of the defined types were placed in this encounter.   Disposition:  Follow up {follow up:15908}  Merrily Pew, MD  02/14/2019 11:47 AM    [facility]

## 2019-02-25 DIAGNOSIS — N281 Cyst of kidney, acquired: Secondary | ICD-10-CM | POA: Diagnosis not present

## 2019-02-25 DIAGNOSIS — R112 Nausea with vomiting, unspecified: Secondary | ICD-10-CM | POA: Diagnosis not present

## 2019-02-25 DIAGNOSIS — R222 Localized swelling, mass and lump, trunk: Secondary | ICD-10-CM | POA: Diagnosis not present

## 2019-02-25 DIAGNOSIS — I10 Essential (primary) hypertension: Secondary | ICD-10-CM | POA: Diagnosis not present

## 2019-02-25 DIAGNOSIS — R069 Unspecified abnormalities of breathing: Secondary | ICD-10-CM | POA: Diagnosis not present

## 2019-02-25 DIAGNOSIS — R4182 Altered mental status, unspecified: Secondary | ICD-10-CM | POA: Diagnosis not present

## 2019-02-25 DIAGNOSIS — R9082 White matter disease, unspecified: Secondary | ICD-10-CM | POA: Diagnosis not present

## 2019-02-25 DIAGNOSIS — R918 Other nonspecific abnormal finding of lung field: Secondary | ICD-10-CM | POA: Diagnosis not present

## 2019-02-25 DIAGNOSIS — K5730 Diverticulosis of large intestine without perforation or abscess without bleeding: Secondary | ICD-10-CM | POA: Diagnosis not present

## 2019-02-25 DIAGNOSIS — G40909 Epilepsy, unspecified, not intractable, without status epilepticus: Secondary | ICD-10-CM | POA: Diagnosis not present

## 2019-02-25 DIAGNOSIS — R413 Other amnesia: Secondary | ICD-10-CM | POA: Diagnosis not present

## 2019-02-25 DIAGNOSIS — R0902 Hypoxemia: Secondary | ICD-10-CM | POA: Diagnosis not present

## 2019-02-25 DIAGNOSIS — G629 Polyneuropathy, unspecified: Secondary | ICD-10-CM | POA: Diagnosis not present

## 2019-02-25 DIAGNOSIS — N179 Acute kidney failure, unspecified: Secondary | ICD-10-CM | POA: Diagnosis not present

## 2019-02-25 DIAGNOSIS — R1013 Epigastric pain: Secondary | ICD-10-CM | POA: Diagnosis not present

## 2019-02-25 DIAGNOSIS — E785 Hyperlipidemia, unspecified: Secondary | ICD-10-CM | POA: Diagnosis not present

## 2019-02-25 DIAGNOSIS — R109 Unspecified abdominal pain: Secondary | ICD-10-CM | POA: Diagnosis not present

## 2019-02-25 DIAGNOSIS — R4020 Unspecified coma: Secondary | ICD-10-CM | POA: Diagnosis not present

## 2019-02-25 DIAGNOSIS — E860 Dehydration: Secondary | ICD-10-CM | POA: Diagnosis not present

## 2019-02-25 DIAGNOSIS — K449 Diaphragmatic hernia without obstruction or gangrene: Secondary | ICD-10-CM | POA: Diagnosis not present

## 2019-02-25 DIAGNOSIS — E871 Hypo-osmolality and hyponatremia: Secondary | ICD-10-CM | POA: Diagnosis not present

## 2019-02-25 DIAGNOSIS — R404 Transient alteration of awareness: Secondary | ICD-10-CM | POA: Diagnosis not present

## 2019-02-26 DIAGNOSIS — E860 Dehydration: Secondary | ICD-10-CM | POA: Diagnosis not present

## 2019-02-28 ENCOUNTER — Encounter: Payer: Self-pay | Admitting: Orthopaedic Surgery

## 2019-02-28 ENCOUNTER — Other Ambulatory Visit: Payer: Self-pay

## 2019-02-28 ENCOUNTER — Ambulatory Visit (INDEPENDENT_AMBULATORY_CARE_PROVIDER_SITE_OTHER): Payer: Medicare HMO | Admitting: Orthopaedic Surgery

## 2019-02-28 ENCOUNTER — Ambulatory Visit (INDEPENDENT_AMBULATORY_CARE_PROVIDER_SITE_OTHER): Payer: Medicare HMO

## 2019-02-28 VITALS — Ht 62.0 in | Wt 184.0 lb

## 2019-02-28 DIAGNOSIS — M545 Low back pain, unspecified: Secondary | ICD-10-CM | POA: Insufficient documentation

## 2019-02-28 DIAGNOSIS — G8929 Other chronic pain: Secondary | ICD-10-CM | POA: Insufficient documentation

## 2019-02-28 NOTE — Addendum Note (Signed)
Addended by: Lendon Collar on: 02/28/2019 04:03 PM   Modules accepted: Orders

## 2019-02-28 NOTE — Progress Notes (Signed)
Office Visit Note   Patient: Carrie Gillespie           Date of Birth: [birth year]           MRN: [id number] Visit Date: 02/28/2019              Requested by: Curlene Labrum, MD [address], [place] [address] PCP: Curlene Labrum, MD   Assessment & Plan: Visit Diagnoses:  1. Chronic midline low back pain, unspecified whether sciatica present     Plan: Acute onset of low back pain several months ago that seems to be worse when she is up and about.  Pain is localized along the lower lumbar spine.  Has had a prior fusion by Dr. Ellene Route in 2014 from L3-L5.  Films demonstrate significant arthritis at L5-S1 disc space.  Also having pain along the sacrum.  I suspect her pain either could originate from the L5-S1 disc space or possibly an insufficiency fracture of the sacrum.  She has had some discomfort in her left leg.  Will order MRI scan with Mars technique and try a back support for temporary comfort  Follow-Up Instructions: No follow-ups on file.   Orders:  Orders Placed This Encounter  Procedures  . XR Lumbar Spine 2-3 Views   No orders of the defined types were placed in this encounter.     Procedures: No procedures performed   Clinical Data: No additional findings.   Subjective: Chief Complaint  Patient presents with  . Lower Back - Pain  Patient presents today with lower back pain. She is having pain in the center of her back. It started while standing at the sink washing dishes two months ago. The pain is getting worse. No pain radiates down her legs. She is taking Tylenol for pain.  Actually having some pain in her left leg.  Had lumbar fusion by Dr. Ellene Route in 2014 with fusion from L3-L5.  Also was had bilateral total hip replacements but without any complication  HPI  Review of Systems   Objective: Vital Signs: Ht 5\' 2"  (1.575 m)   Wt 184 lb (83.5 kg)   BMI 33.65 kg/m   Physical Exam Constitutional:      Appearance: She is well-developed.  Eyes:    Pupils: Pupils are equal, round, and reactive to light.  Pulmonary:     Effort: Pulmonary effort is normal.  Skin:    General: Skin is warm and dry.  Neurological:     Mental Status: She is alert and oriented to person, place, and time.  Psychiatric:        Behavior: Behavior normal.     Ortho Exam is accompanied by her son.  Straight leg raise negative.  Does have percussible tenderness at the lumbosacral junction and along the sacrum more on the right than the left.  Painless range of motion of both hips  Specialty Comments:  No specialty comments available.  Imaging: Xr Lumbar Spine 2-3 Views  Result Date: 02/28/2019 Lumbar spine were obtained in 2 projections.  There is prior fusion between L3 and L5 with good fusion and no hardware complications.  Significant degenerative disc owing at L5-S1.  No listhesis.  Diffuse calcification of the abdominal dilatation.  Also has some narrowing at L1-2 and L2-3 disc spaces.  No listhesis.  Has degenerative lumbar scoliosis to the left.  No obvious compression fractures.    PMFS History: Patient Active Problem List   Diagnosis Date Noted  .  Chronic midline low back pain 02/28/2019  . Primary osteoarthritis of right hip 07/08/2015  . Primary osteoarthritis of left hip 12/24/2014  . S/P total hip arthroplasty 12/24/2014  . Pre-operative cardiovascular examination 11/28/2014  . Nausea with vomiting 07/29/2013  . UTI (urinary tract infection) 07/29/2013  . Bradycardia 07/29/2013  . Status post lumbar laminectomy 07/29/2013  . Syncope 07/25/2013  . Ejection fraction   . Otitis externa 12/16/2011  . Neuropathic pain of lower extremity 12/16/2011  . Nerve entrapment syndrome of lower extremity   . Hyponatremia 06/21/2011  . Insomnia 06/21/2011  . Peripheral vascular disease, unspecified ([place])   . Coronary atherosclerosis of native coronary artery   . Carotid artery disease ([place])   . HYPERLIPIDEMIA-MIXED 05/23/2009  . HTN  (hypertension) 05/23/2009   Past Medical History:  Diagnosis Date  . Anxiety   . Arthritis   . Carotid artery disease ([place])    with an occluded right vertebral artery, otherwise nonobstructive carotid artery disease  . Coronary atherosclerosis of native coronary artery    Status post stent to right coronary 2004, catheterization 2005 nonobstructive CAD, normal LV function, Cardiolite study 2008 negative for ischemia. Residual moderate ostial RCA disease. Stress testing 2012 negative for ischemia ejection fraction 78%  . Cough   . Depression    takes Cymbalta daily  . Diverticulosis   . GERD (gastroesophageal reflux disease)    takes Omeprazole daily  . History of blood transfusion    no abnormal reaction noted  . History of bronchitis    > 71yrs ago   . Joint pain   . Macular degeneration    dry  . Nerve entrapment syndrome of lower extremity     status post prior back surgery and weakness in left leg  . Other and unspecified hyperlipidemia    takes Pravastatin daily  . Peripheral neuropathy   . Peripheral vascular disease, unspecified ([place])    Minimal internal carotid artery disease  . Seizures ([place])    takes Tegretol daily;has only had 1 seizure and that was several yrs ago.  Marland Kitchen Unspecified essential hypertension    takes Lisinopril,Metoprolol,and Imdur daily    Family History  Problem Relation Age of Onset  . Cancer Mother   . Diabetes Mother   . Kidney disease Sister   . Hypertension Other   . Diabetes Other     Past Surgical History:  Procedure Laterality Date  . BACK SURGERY  10/14  . CARDIAC CATHETERIZATION     nonobstructive coronary artery disease  . CATARACT EXTRACTION    . COLONOSCOPY    . CORONARY ANGIOPLASTY  [id number]  . KIDNEY SURGERY  2010   1/4 of right kidney removed [facility]  . KNEE ARTHROPLASTY Left 2004  . PARTIAL HYSTERECTOMY    . TOTAL HIP ARTHROPLASTY Left 12/24/2014   Procedure: TOTAL HIP ARTHROPLASTY;  Surgeon: Garald Balding, MD;   Location: [place];  Service: Orthopedics;  Laterality: Left;  . TOTAL HIP ARTHROPLASTY Right 07/08/2015  . TOTAL HIP ARTHROPLASTY Right 07/08/2015   Procedure: TOTAL HIP ARTHROPLASTY;  Surgeon: Garald Balding, MD;  Location: [place];  Service: Orthopedics;  Laterality: Right;   Social History   Occupational History  . Not on file  Tobacco Use  . Smoking status: Never Smoker  . Smokeless tobacco: Never Used  Substance and Sexual Activity  . Alcohol use: No    Alcohol/week: 0.0 standard drinks  . Drug use: No  . Sexual activity: Not Currently    Birth  control/protection: Surgical

## 2019-03-03 ENCOUNTER — Other Ambulatory Visit: Payer: Self-pay | Admitting: Cardiology

## 2019-03-03 DIAGNOSIS — H6690 Otitis media, unspecified, unspecified ear: Secondary | ICD-10-CM | POA: Diagnosis not present

## 2019-03-03 DIAGNOSIS — Z0131 Encounter for examination of blood pressure with abnormal findings: Secondary | ICD-10-CM | POA: Diagnosis not present

## 2019-03-05 ENCOUNTER — Encounter: Payer: Self-pay | Admitting: Cardiology

## 2019-03-05 ENCOUNTER — Encounter: Payer: Self-pay | Admitting: *Deleted

## 2019-03-05 ENCOUNTER — Telehealth (INDEPENDENT_AMBULATORY_CARE_PROVIDER_SITE_OTHER): Payer: Medicare HMO | Admitting: Cardiology

## 2019-03-05 VITALS — Ht 62.0 in

## 2019-03-05 DIAGNOSIS — E782 Mixed hyperlipidemia: Secondary | ICD-10-CM

## 2019-03-05 DIAGNOSIS — I2510 Atherosclerotic heart disease of native coronary artery without angina pectoris: Secondary | ICD-10-CM

## 2019-03-05 DIAGNOSIS — I10 Essential (primary) hypertension: Secondary | ICD-10-CM

## 2019-03-05 NOTE — Progress Notes (Signed)
Virtual Visit via Telephone Note   This visit type was conducted due to national recommendations for restrictions regarding the COVID-19 Pandemic (e.g. social distancing) in an effort to limit this patient's exposure and mitigate transmission in our community.  Due to her co-morbid illnesses, this patient is at least at moderate risk for complications without adequate follow up.  This format is felt to be most appropriate for this patient at this time.  The patient did not have access to video technology/had technical difficulties with video requiring transitioning to audio format only (telephone).  All issues noted in this document were discussed and addressed.  No physical exam could be performed with this format.  Please refer to the patient's chart for her  consent to telehealth for [facility].   Date:  03/05/2019   ID:  Carrie Gillespie, DOB 01/09/1920, MRN [id number]  Patient Location: Home Provider Location: Office  PCP:  Curlene Labrum, MD  Cardiologist:  Carlyle Dolly, MD  Electrophysiologist:  None   Evaluation Performed:  Follow-Up Visit  Chief Complaint:  6 month follow up  History of Present Illness:    Carrie Gillespie is a 85 y.o. female seen today for follow up of the following medical problems.    1. CAD - history of stent to RCA in 2004. Nuclear stress negative for ischemia in 2008 and 2012 - echo 07/2013 LVEF 65-70%, - 02/2016 lexiscan without significant ischemia  - seen at [facility] ER with neck and shoulder pain 06/2018 - Trop neg x 2, EKG SR without acute ischemic changes. CXR no acute findings.  - pain in neck into left shoulder and arm. Sharp pain, lastsing a few a seconds at a time. No other associated symptoms.   - no recent chest pain - had some SOB, went to [facility] and kept overnight. Records not available at this time.  - has appt with pcp later this week - no recurrent symptoms.    2. HTN - home bp 130s/50s   3.  Hyperlipidemia - compliant with statin - labs followed by pcp  The patient does not have symptoms concerning for COVID-19 infection (fever, chills, cough, or new shortness of breath).    Past Medical History:  Diagnosis Date  . Anxiety   . Arthritis   . Carotid artery disease ([place])    with an occluded right vertebral artery, otherwise nonobstructive carotid artery disease  . Coronary atherosclerosis of native coronary artery    Status post stent to right coronary 2004, catheterization 2005 nonobstructive CAD, normal LV function, Cardiolite study 2008 negative for ischemia. Residual moderate ostial RCA disease. Stress testing 2012 negative for ischemia ejection fraction 78%  . Cough   . Depression    takes Cymbalta daily  . Diverticulosis   . GERD (gastroesophageal reflux disease)    takes Omeprazole daily  . History of blood transfusion    no abnormal reaction noted  . History of bronchitis    > 46yrs ago   . Joint pain   . Macular degeneration    dry  . Nerve entrapment syndrome of lower extremity     status post prior back surgery and weakness in left leg  . Other and unspecified hyperlipidemia    takes Pravastatin daily  . Peripheral neuropathy   . Peripheral vascular disease, unspecified ([place])    Minimal internal carotid artery disease  . Seizures ([place])    takes Tegretol daily;has only had 1 seizure and that was several yrs  ago.  . Unspecified essential hypertension    takes Lisinopril,Metoprolol,and Imdur daily   Past Surgical History:  Procedure Laterality Date  . BACK SURGERY  10/14  . CARDIAC CATHETERIZATION     nonobstructive coronary artery disease  . CATARACT EXTRACTION    . COLONOSCOPY    . CORONARY ANGIOPLASTY  [id number]  . KIDNEY SURGERY  2010   1/4 of right kidney removed [facility]  . KNEE ARTHROPLASTY Left 2004  . PARTIAL HYSTERECTOMY    . TOTAL HIP ARTHROPLASTY Left 12/24/2014   Procedure: TOTAL HIP ARTHROPLASTY;  Surgeon: Garald Balding, MD;   Location: [place];  Service: Orthopedics;  Laterality: Left;  . TOTAL HIP ARTHROPLASTY Right 07/08/2015  . TOTAL HIP ARTHROPLASTY Right 07/08/2015   Procedure: TOTAL HIP ARTHROPLASTY;  Surgeon: Garald Balding, MD;  Location: [place];  Service: Orthopedics;  Laterality: Right;     Current Meds  Medication Sig  . acetaminophen (TYLENOL) 325 MG tablet Take 650 mg by mouth daily.  Marland Kitchen amLODipine (NORVASC) 10 MG tablet Take 1 tablet by mouth once daily  . amoxicillin (AMOXIL) 500 MG capsule Take 1 capsule by mouth 2 (two) times a day.  Marland Kitchen aspirin EC 81 MG tablet Take 81 mg by mouth daily.  . carbamazepine (TEGRETOL XR) 200 MG 12 hr tablet Take 200 mg by mouth daily.    . diphenhydrAMINE (BENADRYL) 25 MG tablet Take 25 mg by mouth every 6 (six) hours as needed for allergies.  . furosemide (LASIX) 20 MG tablet TAKE 1 TABLET DAILY - MAY TAKE ADDITIONAL TABLET AS NEEDED  . isosorbide mononitrate (IMDUR) 120 MG 24 hr tablet Take 120 mg by mouth daily.  Marland Kitchen lisinopril (PRINIVIL,ZESTRIL) 10 MG tablet Take 10 mg by mouth daily.   . metoprolol (LOPRESSOR) 50 MG tablet Take 0.5 tablets (25 mg total) by mouth 2 (two) times daily.  . nitroGLYCERIN (NITROSTAT) 0.4 MG SL tablet DISSOLVE ONE TABLET UNDER THE TONGUE EVERY 5 MINUTES AS NEEDED FOR CHEST PAIN.  DO NOT EXCEED A TOTAL OF 3 DOSES IN 15 MINUTES  . omeprazole (PRILOSEC) 20 MG capsule Take 20 mg by mouth daily. OTC  . pravastatin (PRAVACHOL) 80 MG tablet Take 80 mg by mouth daily.  . traMADol (ULTRAM) 50 MG tablet Take 50 mg by mouth as needed.      Allergies:   Latex   Social History   Tobacco Use  . Smoking status: Never Smoker  . Smokeless tobacco: Never Used  Substance Use Topics  . Alcohol use: No    Alcohol/week: 0.0 standard drinks  . Drug use: No     Family Hx: The patient's family history includes Cancer in her mother; Diabetes in her mother and another family member; Hypertension in an other family member; Kidney disease in her sister.   ROS:   Please see the history of present illness.     All other systems reviewed and are negative.   Prior CV studies:   The following studies were reviewed today:   07/2013 Carotid US Summary: Right: moderate intimal wall thickening CCA. Mild mixed plaque origin ICA. Left: mild sof tplaque distal CCA. Mild calcific plaque origin ICA with acoustic shadowing. Bilateral: 1-395 ICA stenosis. Vertebral artery flow is antegrade. ICA/CCA ratio: R-0.59 L-1.0   02/2016 Lexiscan Nuclear stress  Horizontal ST segment depression after Lexiscan injection. ST segment depression was noted during stress in the II, III, aVF, V5 and V6 leads. Nonspecific finding in setting of severe hypertension  The study is  normal. There are no perfusion defects consistent with prior infarction or current ischemia.  This is a low risk study.  The left ventricular ejection fraction is hyperdynamic (>65%).  Labs/Other Tests and Data Reviewed:    EKG:  No ECG reviewed.  Recent Labs: No results found for requested labs within last 8760 hours.   Recent Lipid Panel No results found for: CHOL, TRIG, HDL, CHOLHDL, LDLCALC, LDLDIRECT  Wt Readings from Last 3 Encounters:  02/28/19 184 lb (83.5 kg)  08/02/18 189 lb (85.7 kg)  05/24/18 189 lb (85.7 kg)     Objective:    Vital Signs:  Ht 5\' 2"  (1.575 m)   BMI 33.65 kg/m    Today's Vitals   03/05/19 0849  Height: 5\' 2"  (1.575 m)   Body mass index is 33.65 kg/m.  Normal affect. Normal speech pattern and tone. Comfortable, no apparent distress. No audible signs or SOb or wheezing.   ASSESSMENT & PLAN:     1. CAD - no recent symptoms, continue current meds    2. HTN -at goal by home numbners, continue current meds  3. Hyperlpidemia - continue statin, request labs from pcp  Requst records from [facility] for recent admission, from speaking with family does not sound like it was a primary cardiac issue. Has pcp f/u later this week   F/u 6  months  COVID-19 Education: The signs and symptoms of COVID-19 were discussed with the patient and how to seek care for testing (follow up with PCP or arrange E-visit).  The importance of social distancing was discussed today.  Time:   Today, I have spent 13 minutes with the patient with telehealth technology discussing the above problems.     Medication Adjustments/Labs and Tests Ordered: Current medicines are reviewed at length with the patient today.  Concerns regarding medicines are outlined above.   Tests Ordered: No orders of the defined types were placed in this encounter.   Medication Changes: No orders of the defined types were placed in this encounter.   Disposition:  Follow up 6 months  Signed, Carlyle Dolly, MD  03/05/2019 9:21 AM    [place]

## 2019-03-05 NOTE — Patient Instructions (Addendum)
Medication Instructions:   Your physician recommends that you continue on your current medications as directed. Please refer to the Current Medication list given to you today.  Labwork:  NONE  Testing/Procedures:  NONE  Follow-Up:  Your physician recommends that you schedule a follow-up appointment in: 4 months. You will receive a reminder letter in the mail in about 1-2 months reminding you to call and schedule your appointment. If you don't receive this letter, please contact our office.  Any Other Special Instructions Will Be Listed Below (If Applicable).  If you need a refill on your cardiac medications before your next appointment, please call your pharmacy.

## 2019-03-06 DIAGNOSIS — M48061 Spinal stenosis, lumbar region without neurogenic claudication: Secondary | ICD-10-CM | POA: Diagnosis not present

## 2019-03-06 DIAGNOSIS — M545 Low back pain: Secondary | ICD-10-CM | POA: Diagnosis not present

## 2019-03-06 DIAGNOSIS — Z981 Arthrodesis status: Secondary | ICD-10-CM | POA: Diagnosis not present

## 2019-03-06 DIAGNOSIS — M5135 Other intervertebral disc degeneration, thoracolumbar region: Secondary | ICD-10-CM | POA: Diagnosis not present

## 2019-03-06 DIAGNOSIS — M5136 Other intervertebral disc degeneration, lumbar region: Secondary | ICD-10-CM | POA: Diagnosis not present

## 2019-03-09 DIAGNOSIS — K2970 Gastritis, unspecified, without bleeding: Secondary | ICD-10-CM | POA: Diagnosis not present

## 2019-03-09 DIAGNOSIS — Z6833 Body mass index (BMI) 33.0-33.9, adult: Secondary | ICD-10-CM | POA: Diagnosis not present

## 2019-03-09 DIAGNOSIS — I10 Essential (primary) hypertension: Secondary | ICD-10-CM | POA: Diagnosis not present

## 2019-03-09 DIAGNOSIS — G40802 Other epilepsy, not intractable, without status epilepticus: Secondary | ICD-10-CM | POA: Diagnosis not present

## 2019-03-09 DIAGNOSIS — K219 Gastro-esophageal reflux disease without esophagitis: Secondary | ICD-10-CM | POA: Diagnosis not present

## 2019-03-09 DIAGNOSIS — D638 Anemia in other chronic diseases classified elsewhere: Secondary | ICD-10-CM | POA: Diagnosis not present

## 2019-03-09 DIAGNOSIS — E782 Mixed hyperlipidemia: Secondary | ICD-10-CM | POA: Diagnosis not present

## 2019-03-09 DIAGNOSIS — N183 Chronic kidney disease, stage 3 (moderate): Secondary | ICD-10-CM | POA: Diagnosis not present

## 2019-03-14 ENCOUNTER — Encounter: Payer: Self-pay | Admitting: Orthopaedic Surgery

## 2019-03-14 ENCOUNTER — Ambulatory Visit (INDEPENDENT_AMBULATORY_CARE_PROVIDER_SITE_OTHER): Payer: Medicare HMO | Admitting: Orthopaedic Surgery

## 2019-03-14 ENCOUNTER — Other Ambulatory Visit: Payer: Self-pay

## 2019-03-14 VITALS — Ht 62.0 in | Wt 184.0 lb

## 2019-03-14 DIAGNOSIS — G8929 Other chronic pain: Secondary | ICD-10-CM | POA: Diagnosis not present

## 2019-03-14 DIAGNOSIS — M545 Low back pain, unspecified: Secondary | ICD-10-CM

## 2019-03-14 NOTE — Progress Notes (Signed)
Office Visit Note   Patient: Carrie Gillespie           Date of Birth: 1920-08-02           MRN: [id number] Visit Date: 03/14/2019              Requested by: Curlene Labrum, MD [place],  [place] [id number] PCP: Curlene Labrum, MD   Assessment & Plan: Visit Diagnoses:  1. Chronic midline low back pain, unspecified whether sciatica present     Plan: MRI scan demonstrates prior L3-L5 PLIF progressive adjacent segment degenerative disc disease at L2-3 with severe spinal canal stenosis.  Mild spinal canal stenosis at L1 to with moderate left and mild right lateral recess stenosis.  L5-S1 with shallow broad-based posterior disc protrusion and mild bilateral facet arthropathy without stenosis.  Carrie Gillespie is having more trouble the longer she stands particularly with her back and to a lesser extent her left lower extremity.  I believe this is related to her stenosis.  She will continue with the lumbar support.  Will ask Dr. Ernestina Patches to evaluate her for consideration of an epidural steroid injection.  Office visit over 30 minutes 50% of the time in counseling.  Son was in attendance  Follow-Up Instructions: Return in about 1 month (around 04/13/2019).   Orders:  Orders Placed This Encounter  Procedures  . Ambulatory referral to Physical Medicine Rehab   No orders of the defined types were placed in this encounter.     Procedures: No procedures performed   Clinical Data: No additional findings.   Subjective: Chief Complaint  Patient presents with  . Lower Back - Follow-up  Patient presents today for a two week follow up on her lower back. She had an MRI on her back and is here today for those results.  No change in her symptoms.  Still having predominant low back pain when she stands or when she walks any distance associated with discomfort in her left leg.  Is a chronic issue with balance and uses a rolling walker  HPI  Review of Systems   Objective: Vital Signs: Ht 5'  2" (1.575 m)   Wt 184 lb (83.5 kg)   BMI 33.65 kg/m   Physical Exam Constitutional:      Appearance: She is well-developed.  Eyes:     Pupils: Pupils are equal, round, and reactive to light.  Pulmonary:     Effort: Pulmonary effort is normal.  Skin:    General: Skin is warm and dry.  Neurological:     Mental Status: She is alert and oriented to person, place, and time.  Psychiatric:        Behavior: Behavior normal.     Ortho Exam awake alert and oriented.  Straight leg raise negative.  Motor exam intact.  Sensory exam appears to be intact.  Some percussible to tenderness diffusely in the lumbar spine.  No pain over either sacroiliac joint or sacrum  Specialty Comments:  No specialty comments available.  Imaging: No results found.   PMFS History: Patient Active Problem List   Diagnosis Date Noted  . Chronic midline low back pain 02/28/2019  . Primary osteoarthritis of right hip 07/08/2015  . Primary osteoarthritis of left hip 12/24/2014  . S/P total hip arthroplasty 12/24/2014  . Pre-operative cardiovascular examination 11/28/2014  . Nausea with vomiting 07/29/2013  . UTI (urinary tract infection) 07/29/2013  . Bradycardia 07/29/2013  . Status post lumbar laminectomy 07/29/2013  .  Syncope 07/25/2013  . Ejection fraction   . Otitis externa 12/16/2011  . Neuropathic pain of lower extremity 12/16/2011  . Nerve entrapment syndrome of lower extremity   . Hyponatremia 06/21/2011  . Insomnia 06/21/2011  . Peripheral vascular disease, unspecified ([place])   . Coronary atherosclerosis of native coronary artery   . Carotid artery disease ([place])   . HYPERLIPIDEMIA-MIXED 05/23/2009  . HTN (hypertension) 05/23/2009   Past Medical History:  Diagnosis Date  . Anxiety   . Arthritis   . Carotid artery disease ([place])    with an occluded right vertebral artery, otherwise nonobstructive carotid artery disease  . Coronary atherosclerosis of native coronary artery    Status post  stent to right coronary 2004, catheterization 2005 nonobstructive CAD, normal LV function, Cardiolite study 2008 negative for ischemia. Residual moderate ostial RCA disease. Stress testing 2012 negative for ischemia ejection fraction 78%  . Cough   . Depression    takes Cymbalta daily  . Diverticulosis   . GERD (gastroesophageal reflux disease)    takes Omeprazole daily  . History of blood transfusion    no abnormal reaction noted  . History of bronchitis    > 9yrs ago   . Joint pain   . Macular degeneration    dry  . Nerve entrapment syndrome of lower extremity     status post prior back surgery and weakness in left leg  . Other and unspecified hyperlipidemia    takes Pravastatin daily  . Peripheral neuropathy   . Peripheral vascular disease, unspecified ([place])    Minimal internal carotid artery disease  . Seizures ([place])    takes Tegretol daily;has only had 1 seizure and that was several yrs ago.  Marland Kitchen Unspecified essential hypertension    takes Lisinopril,Metoprolol,and Imdur daily    Family History  Problem Relation Age of Onset  . Cancer Mother   . Diabetes Mother   . Kidney disease Sister   . Hypertension Other   . Diabetes Other     Past Surgical History:  Procedure Laterality Date  . BACK SURGERY  10/14  . CARDIAC CATHETERIZATION     nonobstructive coronary artery disease  . CATARACT EXTRACTION    . COLONOSCOPY    . CORONARY ANGIOPLASTY  [id number]  . KIDNEY SURGERY  2010   1/4 of right kidney removed [facility]  . KNEE ARTHROPLASTY Left 2004  . PARTIAL HYSTERECTOMY    . TOTAL HIP ARTHROPLASTY Left 12/24/2014   Procedure: TOTAL HIP ARTHROPLASTY;  Surgeon: Garald Balding, MD;  Location: [facility];  Service: Orthopedics;  Laterality: Left;  . TOTAL HIP ARTHROPLASTY Right 07/08/2015  . TOTAL HIP ARTHROPLASTY Right 07/08/2015   Procedure: TOTAL HIP ARTHROPLASTY;  Surgeon: Garald Balding, MD;  Location: [facility];  Service: Orthopedics;  Laterality: Right;   Social History    Occupational History  . Not on file  Tobacco Use  . Smoking status: Never Smoker  . Smokeless tobacco: Never Used  Substance and Sexual Activity  . Alcohol use: No    Alcohol/week: 0.0 standard drinks  . Drug use: No  . Sexual activity: Not Currently    Birth control/protection: Surgical

## 2019-03-16 DIAGNOSIS — E871 Hypo-osmolality and hyponatremia: Secondary | ICD-10-CM | POA: Diagnosis not present

## 2019-03-16 DIAGNOSIS — E782 Mixed hyperlipidemia: Secondary | ICD-10-CM | POA: Diagnosis not present

## 2019-03-16 DIAGNOSIS — N179 Acute kidney failure, unspecified: Secondary | ICD-10-CM | POA: Diagnosis not present

## 2019-03-16 DIAGNOSIS — N183 Chronic kidney disease, stage 3 (moderate): Secondary | ICD-10-CM | POA: Diagnosis not present

## 2019-03-16 DIAGNOSIS — G40802 Other epilepsy, not intractable, without status epilepticus: Secondary | ICD-10-CM | POA: Diagnosis not present

## 2019-03-16 DIAGNOSIS — J849 Interstitial pulmonary disease, unspecified: Secondary | ICD-10-CM | POA: Diagnosis not present

## 2019-03-16 DIAGNOSIS — Z6833 Body mass index (BMI) 33.0-33.9, adult: Secondary | ICD-10-CM | POA: Diagnosis not present

## 2019-03-16 DIAGNOSIS — I10 Essential (primary) hypertension: Secondary | ICD-10-CM | POA: Diagnosis not present

## 2019-03-20 DIAGNOSIS — Z961 Presence of intraocular lens: Secondary | ICD-10-CM | POA: Diagnosis not present

## 2019-03-20 DIAGNOSIS — H52223 Regular astigmatism, bilateral: Secondary | ICD-10-CM | POA: Diagnosis not present

## 2019-03-20 DIAGNOSIS — H353133 Nonexudative age-related macular degeneration, bilateral, advanced atrophic without subfoveal involvement: Secondary | ICD-10-CM | POA: Diagnosis not present

## 2019-04-04 ENCOUNTER — Ambulatory Visit: Payer: Self-pay

## 2019-04-04 ENCOUNTER — Other Ambulatory Visit: Payer: Self-pay

## 2019-04-04 ENCOUNTER — Encounter: Payer: Self-pay | Admitting: Physical Medicine and Rehabilitation

## 2019-04-04 ENCOUNTER — Ambulatory Visit (INDEPENDENT_AMBULATORY_CARE_PROVIDER_SITE_OTHER): Payer: Medicare HMO | Admitting: Physical Medicine and Rehabilitation

## 2019-04-04 VITALS — BP 155/70 | HR 67 | Ht 62.0 in | Wt 185.0 lb

## 2019-04-04 DIAGNOSIS — M5416 Radiculopathy, lumbar region: Secondary | ICD-10-CM

## 2019-04-04 MED ORDER — DEXAMETHASONE SODIUM PHOSPHATE 10 MG/ML IJ SOLN
15.0000 mg | Freq: Once | INTRAMUSCULAR | Status: AC
  Administered 2019-04-04: 15 mg

## 2019-04-04 NOTE — Progress Notes (Signed)
   Numeric Pain Rating Scale and Functional Assessment Average Pain 9   In the last MONTH (on 0-10 scale) has pain interfered with the following?  1. General activity like being  able to carry out your everyday physical activities such as walking, climbing stairs, carrying groceries, or moving a chair?  Rating(0)   +Driver, -BT, -Dye Allergies.

## 2019-04-11 ENCOUNTER — Telehealth: Payer: Self-pay | Admitting: Physical Medicine and Rehabilitation

## 2019-04-11 NOTE — Telephone Encounter (Signed)
She does have severe stenosis at L2-3 but her symptoms are more L5 related so I would try left L5 transforaminal injection.  We can discuss further when she comes in.

## 2019-04-11 NOTE — Procedures (Signed)
Lumbosacral Transforaminal Epidural Steroid Injection - Sub-Pedicular Approach with Fluoroscopic Guidance  Patient: Carrie Gillespie      Date of Birth: [birth year] MRN: [id number] PCP: Curlene Labrum, MD      Visit Date: 04/04/2019   Universal Protocol:    Date/Time: 04/04/2019  Consent Given By: the patient  Position: PRONE  Additional Comments: Vital signs were monitored before and after the procedure. Patient was prepped and draped in the usual sterile fashion. The correct patient, procedure, and site was verified.   Injection Procedure Details:  Procedure Site One Meds Administered:  Meds ordered this encounter  Medications  . dexamethasone (DECADRON) injection 15 mg    Laterality: Bilateral  Location/Site:  L2-L3  Needle size: 22 G  Needle type: Spinal  Needle Placement: Transforaminal  Findings:    -Comments: Excellent flow of contrast along the nerve and into the epidural space.  Procedure Details: After squaring off the end-plates to get a true AP view, the C-arm was positioned so that an oblique view of the foramen as noted above was visualized. The target area is just inferior to the "nose of the scotty dog" or sub pedicular. The soft tissues overlying this structure were infiltrated with 2-3 ml. of 1% Lidocaine without Epinephrine.  The spinal needle was inserted toward the target using a "trajectory" view along the fluoroscope beam.  Under AP and lateral visualization, the needle was advanced so it did not puncture dura and was located close the 6 O'Clock position of the pedical in AP tracterory. Biplanar projections were used to confirm position. Aspiration was confirmed to be negative for CSF and/or blood. A 1-2 ml. volume of Isovue-250 was injected and flow of contrast was noted at each level. Radiographs were obtained for documentation purposes.   After attaining the desired flow of contrast documented above, a 0.5 to 1.0 ml test dose of 0.25% Marcaine  was injected into each respective transforaminal space.  The patient was observed for 90 seconds post injection.  After no sensory deficits were reported, and normal lower extremity motor function was noted,   the above injectate was administered so that equal amounts of the injectate were placed at each foramen (level) into the transforaminal epidural space.   Additional Comments:  The patient tolerated the procedure well Dressing: 2 x 2 sterile gauze and Band-Aid    Post-procedure details: Patient was observed during the procedure. Post-procedure instructions were reviewed.  Patient left the clinic in stable condition.

## 2019-04-11 NOTE — Telephone Encounter (Signed)
Patient is feeling better this afternoon, but she did not sleep at all last night due to the pain. I did go ahead and schedule her for an L5 TF on 7/30. Her daughter states that she does take Tylenol and Tramadol which work pretty well for her. She only has about 7 tramadol left. She is requesting a refill of this unless you can recommend something else for pain. Pharmacy is correct.

## 2019-04-11 NOTE — Progress Notes (Signed)
Carrie Gillespie - 85 y.o. female MRN [id number]  Date of birth: 1919-10-30  Office Visit Note: Visit Date: 04/04/2019 PCP: Curlene Labrum, MD Referred by: Curlene Labrum, MD  Subjective: Chief Complaint  Patient presents with  . Lower Back - Pain   HPI:  Carrie Gillespie is a 85 y.o. female who comes in today At the request of Dr. [name] for interventional spine procedure.  The patient is reporting low back pain with referring left leg pain and a pretty classic L5 distribution to the ankle.  She does not really have any numbness or tingling.  She reports that the onset of pain was about a month ago and she just had worsening severe symptoms over the last couple weeks.  She has tried and failed conservative care through Dr. Durward Fortes.  MRI has been obtained and is reviewed below.  Patient has had prior PLIF fusion at L4-5 and L3-4.  At L2-3 there is been progressive stenosis which is quite severe at this point.  At L5-S1 there is some facet arthropathy and lateral recess narrowing and some bulging.  While her symptoms are classic L5 distribution it could be related to the severe stenosis above the fusion.  We are going to complete bilateral L2 transforaminal injections today.  Depending on relief would look at L5 transforaminal injection on the left.  She obviously does not represent a great surgical case at 85 years old and prior two-level fusion.  ROS Otherwise per HPI.  Assessment & Plan: Visit Diagnoses:  1. Lumbar radiculopathy     Plan: No additional findings.   Meds & Orders:  Meds ordered this encounter  Medications  . dexamethasone (DECADRON) injection 15 mg    Orders Placed This Encounter  Procedures  . XR C-ARM NO REPORT  . Epidural Steroid injection    Follow-up: No follow-ups on file.   Procedures: No procedures performed  Lumbosacral Transforaminal Epidural Steroid Injection - Sub-Pedicular Approach with Fluoroscopic Guidance  Patient: Carrie Gillespie       Date of Birth: 1920/04/30 MRN: [id number] PCP: Curlene Labrum, MD      Visit Date: 04/04/2019   Universal Protocol:    Date/Time: 04/04/2019  Consent Given By: the patient  Position: PRONE  Additional Comments: Vital signs were monitored before and after the procedure. Patient was prepped and draped in the usual sterile fashion. The correct patient, procedure, and site was verified.   Injection Procedure Details:  Procedure Site One Meds Administered:  Meds ordered this encounter  Medications  . dexamethasone (DECADRON) injection 15 mg    Laterality: Bilateral  Location/Site:  L2-L3  Needle size: 22 G  Needle type: Spinal  Needle Placement: Transforaminal  Findings:    -Comments: Excellent flow of contrast along the nerve and into the epidural space.  Procedure Details: After squaring off the end-plates to get a true AP view, the C-arm was positioned so that an oblique view of the foramen as noted above was visualized. The target area is just inferior to the "nose of the scotty dog" or sub pedicular. The soft tissues overlying this structure were infiltrated with 2-3 ml. of 1% Lidocaine without Epinephrine.  The spinal needle was inserted toward the target using a "trajectory" view along the fluoroscope beam.  Under AP and lateral visualization, the needle was advanced so it did not puncture dura and was located close the 6 O'Clock position of the pedical in AP tracterory. Biplanar projections were used to confirm  position. Aspiration was confirmed to be negative for CSF and/or blood. A 1-2 ml. volume of Isovue-250 was injected and flow of contrast was noted at each level. Radiographs were obtained for documentation purposes.   After attaining the desired flow of contrast documented above, a 0.5 to 1.0 ml test dose of 0.25% Marcaine was injected into each respective transforaminal space.  The patient was observed for 90 seconds post injection.  After no sensory  deficits were reported, and normal lower extremity motor function was noted,   the above injectate was administered so that equal amounts of the injectate were placed at each foramen (level) into the transforaminal epidural space.   Additional Comments:  The patient tolerated the procedure well Dressing: 2 x 2 sterile gauze and Band-Aid    Post-procedure details: Patient was observed during the procedure. Post-procedure instructions were reviewed.  Patient left the clinic in stable condition.    Clinical History: MRI LUMBAR SPINE WITHOUT CONTRAST  TECHNIQUE: Multiplanar, multisequence MR imaging of the lumbar spine was performed. No intravenous contrast was administered.  COMPARISON: CT abdomen pelvis dated September 08, 2018. MRI lumbar spine dated June 18, 2013.  FINDINGS: Segmentation: Standard.  Alignment: Unchanged trace retrolisthesis at L1-L2 and L2-L3. Unchanged fused trace anterolisthesis at L4-L5.  Vertebrae: Prior L3-L5 PLIF with solid osseous fusion. No fracture, evidence of discitis, or focal bone lesion. Degenerative endplate marrow edema at L2-L3.  Conus medullaris and cauda equina: Conus extends to the T12-L1 level. Conus and cauda equina appear normal.  Paraspinal and other soft tissues: Large left renal cyst again noted. Sigmoid diverticulosis.  Disc levels:  T12-L1: Increased disc bulging with superimposed right-sided disc protrusion. New mild to moderate right lateral recess and neuroforaminal stenosis. No spinal canal or left neuroforaminal stenosis.  L1-L2: Progressive disc bulging with new superimposed small left subarticular disc protrusion. New mild spinal canal stenosis and progressive now moderate left and mild right lateral recess stenosis. No neuroforaminal stenosis.  L2-L3: Progressive disc bulging and posterior element hypertrophy with progressive now severe spinal canal and left greater than right lateral recess stenosis. New  mild to moderate left neuroforaminal stenosis. No right neuroforaminal stenosis.  L3-L4: Prior PLIF. No residual stenosis.  L4-L5: Prior PLIF. No residual stenosis.  L5-S1: Unchanged small shallow broad-based posterior disc protrusion and mild bilateral facet arthropathy. No stenosis.  IMPRESSION: 1. Prior L3-L5 PLIF with progressive adjacent segment degenerative disc disease at L2-L3, now with severe spinal canal stenosis. 2. Progressive degenerative disc disease at T12-L1 and L1-L2 as described above.   Electronically Signed By: Titus Dubin M.D. On: 03/06/2019 17:10     Objective:  VS:  HT:5\' 2"  (157.5 cm)   WT:185 lb (83.9 kg)  BMI:33.83    BP:(!) 155/70  HR:67bpm  TEMP: ( )  RESP:  Physical Exam  Ortho Exam Imaging: No results found.

## 2019-04-12 ENCOUNTER — Other Ambulatory Visit: Payer: Self-pay | Admitting: Physical Medicine and Rehabilitation

## 2019-04-12 MED ORDER — TRAMADOL HCL 50 MG PO TABS: 50.0000 mg | ORAL_TABLET | ORAL | 0 refills | Status: DC | PRN

## 2019-04-12 NOTE — Progress Notes (Signed)
Refill Temp tramadol. PDMP checked.

## 2019-04-12 NOTE — Telephone Encounter (Signed)
refilled 

## 2019-04-26 ENCOUNTER — Encounter: Payer: Self-pay | Admitting: Physical Medicine and Rehabilitation

## 2019-04-26 ENCOUNTER — Ambulatory Visit (INDEPENDENT_AMBULATORY_CARE_PROVIDER_SITE_OTHER): Payer: Medicare HMO | Admitting: Physical Medicine and Rehabilitation

## 2019-04-26 ENCOUNTER — Ambulatory Visit: Payer: Self-pay

## 2019-04-26 ENCOUNTER — Other Ambulatory Visit: Payer: Self-pay

## 2019-04-26 VITALS — BP 164/65 | HR 67

## 2019-04-26 DIAGNOSIS — M48062 Spinal stenosis, lumbar region with neurogenic claudication: Secondary | ICD-10-CM

## 2019-04-26 DIAGNOSIS — M961 Postlaminectomy syndrome, not elsewhere classified: Secondary | ICD-10-CM

## 2019-04-26 DIAGNOSIS — M5416 Radiculopathy, lumbar region: Secondary | ICD-10-CM

## 2019-04-26 MED ORDER — BETAMETHASONE SOD PHOS & ACET 6 (3-3) MG/ML IJ SUSP
12.0000 mg | Freq: Once | INTRAMUSCULAR | Status: AC
  Administered 2019-04-26: 11:00:00 12 mg

## 2019-04-26 NOTE — Progress Notes (Signed)
 .  Numeric Pain Rating Scale and Functional Assessment Average Pain 8   In the last MONTH (on 0-10 scale) has pain interfered with the following?  1. General activity like being  able to carry out your everyday physical activities such as walking, climbing stairs, carrying groceries, or moving a chair?  Rating(7)   +Driver, -BT, -Dye Allergies.  

## 2019-05-04 ENCOUNTER — Other Ambulatory Visit: Payer: Self-pay | Admitting: Physical Medicine and Rehabilitation

## 2019-05-15 NOTE — Telephone Encounter (Signed)
Please advise 

## 2019-05-17 MED ORDER — TRAMADOL HCL 50 MG PO TABS: 50.0000 mg | ORAL_TABLET | Freq: Two times a day (BID) | ORAL | 0 refills | Status: DC | PRN

## 2019-05-17 NOTE — Addendum Note (Signed)
Addended by: Raymondo Band on: 05/17/2019 08:55 AM   Modules accepted: Orders

## 2019-05-17 NOTE — Telephone Encounter (Signed)
Rx resent.

## 2019-06-12 NOTE — Procedures (Signed)
Lumbosacral Transforaminal Epidural Steroid Injection - Sub-Pedicular Approach with Fluoroscopic Guidance  Patient: Carrie Gillespie      Date of Birth: [birth year] MRN: [id number] PCP: Curlene Labrum, MD      Visit Date: 04/26/2019   Universal Protocol:    Date/Time: 04/26/2019  Consent Given By: the patient  Position: PRONE  Additional Comments: Vital signs were monitored before and after the procedure. Patient was prepped and draped in the usual sterile fashion. The correct patient, procedure, and site was verified.   Injection Procedure Details:  Procedure Site One Meds Administered:  Meds ordered this encounter  Medications  . betamethasone acetate-betamethasone sodium phosphate (CELESTONE) injection 12 mg    Laterality: Left  Location/Site:  L5-S1  Needle size: 22 G  Needle type: Spinal  Needle Placement: Transforaminal  Findings:    -Comments: Excellent flow of contrast along the nerve and into the epidural space.  Procedure Details: After squaring off the end-plates to get a true AP view, the C-arm was positioned so that an oblique view of the foramen as noted above was visualized. The target area is just inferior to the "nose of the scotty dog" or sub pedicular. The soft tissues overlying this structure were infiltrated with 2-3 ml. of 1% Lidocaine without Epinephrine.  The spinal needle was inserted toward the target using a "trajectory" view along the fluoroscope beam.  Under AP and lateral visualization, the needle was advanced so it did not puncture dura and was located close the 6 O'Clock position of the pedical in AP tracterory. Biplanar projections were used to confirm position. Aspiration was confirmed to be negative for CSF and/or blood. A 1-2 ml. volume of Isovue-250 was injected and flow of contrast was noted at each level. Radiographs were obtained for documentation purposes.   After attaining the desired flow of contrast documented above, a 0.5 to  1.0 ml test dose of 0.25% Marcaine was injected into each respective transforaminal space.  The patient was observed for 90 seconds post injection.  After no sensory deficits were reported, and normal lower extremity motor function was noted,   the above injectate was administered so that equal amounts of the injectate were placed at each foramen (level) into the transforaminal epidural space.   Additional Comments:  The patient tolerated the procedure well Dressing: 2 x 2 sterile gauze and Band-Aid    Post-procedure details: Patient was observed during the procedure. Post-procedure instructions were reviewed.  Patient left the clinic in stable condition.

## 2019-06-12 NOTE — Progress Notes (Signed)
Carrie Gillespie - 85 y.o. female MRN [id number]  Date of birth: 02/03/1920  Office Visit Note: Visit Date: 04/26/2019 PCP: Curlene Labrum, MD Referred by: Curlene Labrum, MD  Subjective: Chief Complaint  Patient presents with  . Lower Back - Pain  . Left Thigh - Pain   HPI: Carrie Gillespie is a 85 y.o. female who comes in today For planned left L5 transforaminal epidural steroid injection.  Patient is very pleasant 85 year old female with left radicular leg pain in a classic L5 distribution.  She has had prior lumbar fusion with adjacent level disease particular at L2-3 with severe central stenosis.  Prior L2 injection gave her a little bit of relief for a few days but she really has severe pain at this point.  She rates her pain is 8 out of 10.  We have been giving her some tramadol and this does help ease the pain when she takes it.  She reports walking and movement increase her symptoms.  We are going to complete L5 transforaminal injection diagnostically hopefully therapeutically.  Depending on results would look at more medication type management versus having her see her primary care physician for different disposition.  ROS Otherwise per HPI.  Assessment & Plan: Visit Diagnoses:  1. Lumbar radiculopathy   2. Post laminectomy syndrome   3. Spinal stenosis of lumbar region with neurogenic claudication     Plan: No additional findings.   Meds & Orders:  Meds ordered this encounter  Medications  . betamethasone acetate-betamethasone sodium phosphate (CELESTONE) injection 12 mg    Orders Placed This Encounter  Procedures  . XR C-ARM NO REPORT  . Epidural Steroid injection    Follow-up: Return if symptoms worsen or fail to improve.   Procedures: No procedures performed  Lumbosacral Transforaminal Epidural Steroid Injection - Sub-Pedicular Approach with Fluoroscopic Guidance  Patient: Carrie Gillespie      Date of Birth: 02/22/1920 MRN: [id number] PCP: Curlene Labrum,  MD      Visit Date: 04/26/2019   Universal Protocol:    Date/Time: 04/26/2019  Consent Given By: the patient  Position: PRONE  Additional Comments: Vital signs were monitored before and after the procedure. Patient was prepped and draped in the usual sterile fashion. The correct patient, procedure, and site was verified.   Injection Procedure Details:  Procedure Site One Meds Administered:  Meds ordered this encounter  Medications  . betamethasone acetate-betamethasone sodium phosphate (CELESTONE) injection 12 mg    Laterality: Left  Location/Site:  L5-S1  Needle size: 22 G  Needle type: Spinal  Needle Placement: Transforaminal  Findings:    -Comments: Excellent flow of contrast along the nerve and into the epidural space.  Procedure Details: After squaring off the end-plates to get a true AP view, the C-arm was positioned so that an oblique view of the foramen as noted above was visualized. The target area is just inferior to the "nose of the scotty dog" or sub pedicular. The soft tissues overlying this structure were infiltrated with 2-3 ml. of 1% Lidocaine without Epinephrine.  The spinal needle was inserted toward the target using a "trajectory" view along the fluoroscope beam.  Under AP and lateral visualization, the needle was advanced so it did not puncture dura and was located close the 6 O'Clock position of the pedical in AP tracterory. Biplanar projections were used to confirm position. Aspiration was confirmed to be negative for CSF and/or blood. A 1-2 ml. volume of Isovue-250 was injected  and flow of contrast was noted at each level. Radiographs were obtained for documentation purposes.   After attaining the desired flow of contrast documented above, a 0.5 to 1.0 ml test dose of 0.25% Marcaine was injected into each respective transforaminal space.  The patient was observed for 90 seconds post injection.  After no sensory deficits were reported, and normal  lower extremity motor function was noted,   the above injectate was administered so that equal amounts of the injectate were placed at each foramen (level) into the transforaminal epidural space.   Additional Comments:  The patient tolerated the procedure well Dressing: 2 x 2 sterile gauze and Band-Aid    Post-procedure details: Patient was observed during the procedure. Post-procedure instructions were reviewed.  Patient left the clinic in stable condition.    Clinical History: MRI LUMBAR SPINE WITHOUT CONTRAST  TECHNIQUE: Multiplanar, multisequence MR imaging of the lumbar spine was performed. No intravenous contrast was administered.  COMPARISON: CT abdomen pelvis dated September 08, 2018. MRI lumbar spine dated June 18, 2013.  FINDINGS: Segmentation: Standard.  Alignment: Unchanged trace retrolisthesis at L1-L2 and L2-L3. Unchanged fused trace anterolisthesis at L4-L5.  Vertebrae: Prior L3-L5 PLIF with solid osseous fusion. No fracture, evidence of discitis, or focal bone lesion. Degenerative endplate marrow edema at L2-L3.  Conus medullaris and cauda equina: Conus extends to the T12-L1 level. Conus and cauda equina appear normal.  Paraspinal and other soft tissues: Large left renal cyst again noted. Sigmoid diverticulosis.  Disc levels:  T12-L1: Increased disc bulging with superimposed right-sided disc protrusion. New mild to moderate right lateral recess and neuroforaminal stenosis. No spinal canal or left neuroforaminal stenosis.  L1-L2: Progressive disc bulging with new superimposed small left subarticular disc protrusion. New mild spinal canal stenosis and progressive now moderate left and mild right lateral recess stenosis. No neuroforaminal stenosis.  L2-L3: Progressive disc bulging and posterior element hypertrophy with progressive now severe spinal canal and left greater than right lateral recess stenosis. New mild to moderate left  neuroforaminal stenosis. No right neuroforaminal stenosis.  L3-L4: Prior PLIF. No residual stenosis.  L4-L5: Prior PLIF. No residual stenosis.  L5-S1: Unchanged small shallow broad-based posterior disc protrusion and mild bilateral facet arthropathy. No stenosis.  IMPRESSION: 1. Prior L3-L5 PLIF with progressive adjacent segment degenerative disc disease at L2-L3, now with severe spinal canal stenosis. 2. Progressive degenerative disc disease at T12-L1 and L1-L2 as described above.   Electronically Signed By: Titus Dubin M.D. On: 03/06/2019 17:10   She reports that she has never smoked. She has never used smokeless tobacco. No results for input(s): HGBA1C, LABURIC in the last 8760 hours.  Objective:  VS:  HT:    WT:   BMI:     BP:(!) 164/65  HR:67bpm  TEMP: ( )  RESP:  Physical Exam  Ortho Exam Imaging: No results found.  Past Medical/Family/Surgical/Social History: Medications & Allergies reviewed per EMR, new medications updated. Patient Active Problem List   Diagnosis Date Noted  . Chronic midline low back pain 02/28/2019  . Primary osteoarthritis of right hip 07/08/2015  . Primary osteoarthritis of left hip 12/24/2014  . S/P total hip arthroplasty 12/24/2014  . Pre-operative cardiovascular examination 11/28/2014  . Nausea with vomiting 07/29/2013  . UTI (urinary tract infection) 07/29/2013  . Bradycardia 07/29/2013  . Status post lumbar laminectomy 07/29/2013  . Syncope 07/25/2013  . Ejection fraction   . Otitis externa 12/16/2011  . Neuropathic pain of lower extremity 12/16/2011  . Nerve entrapment syndrome of  lower extremity   . Hyponatremia 06/21/2011  . Insomnia 06/21/2011  . Peripheral vascular disease, unspecified ([place])   . Coronary atherosclerosis of native coronary artery   . Carotid artery disease ([place])   . HYPERLIPIDEMIA-MIXED 05/23/2009  . HTN (hypertension) 05/23/2009   Past Medical History:  Diagnosis Date  . Anxiety   . Arthritis    . Carotid artery disease ([place])    with an occluded right vertebral artery, otherwise nonobstructive carotid artery disease  . Coronary atherosclerosis of native coronary artery    Status post stent to right coronary 2004, catheterization 2005 nonobstructive CAD, normal LV function, Cardiolite study 2008 negative for ischemia. Residual moderate ostial RCA disease. Stress testing 2012 negative for ischemia ejection fraction 78%  . Cough   . Depression    takes Cymbalta daily  . Diverticulosis   . GERD (gastroesophageal reflux disease)    takes Omeprazole daily  . History of blood transfusion    no abnormal reaction noted  . History of bronchitis    > 66yrs ago   . Joint pain   . Macular degeneration    dry  . Nerve entrapment syndrome of lower extremity     status post prior back surgery and weakness in left leg  . Other and unspecified hyperlipidemia    takes Pravastatin daily  . Peripheral neuropathy   . Peripheral vascular disease, unspecified ([place])    Minimal internal carotid artery disease  . Seizures ([place])    takes Tegretol daily;has only had 1 seizure and that was several yrs ago.  [place] Kitchen Unspecified essential hypertension    takes Lisinopril,Metoprolol,and Imdur daily   Family History  Problem Relation Age of Onset  . Cancer Mother   . Diabetes Mother   . Kidney disease Sister   . Hypertension Other   . Diabetes Other    Past Surgical History:  Procedure Laterality Date  . BACK SURGERY  10/14  . CARDIAC CATHETERIZATION     nonobstructive coronary artery disease  . CATARACT EXTRACTION    . COLONOSCOPY    . CORONARY ANGIOPLASTY  [id number]  . KIDNEY SURGERY  2010   1/4 of right kidney removed [facility]  . KNEE ARTHROPLASTY Left 2004  . PARTIAL HYSTERECTOMY    . TOTAL HIP ARTHROPLASTY Left 12/24/2014   Procedure: TOTAL HIP ARTHROPLASTY;  Surgeon: Garald Balding, MD;  Location: [facility];  Service: Orthopedics;  Laterality: Left;  . TOTAL HIP ARTHROPLASTY Right 07/08/2015   . TOTAL HIP ARTHROPLASTY Right 07/08/2015   Procedure: TOTAL HIP ARTHROPLASTY;  Surgeon: Garald Balding, MD;  Location: [facility];  Service: Orthopedics;  Laterality: Right;   Social History   Occupational History  . Not on file  Tobacco Use  . Smoking status: Never Smoker  . Smokeless tobacco: Never Used  Substance and Sexual Activity  . Alcohol use: No    Alcohol/week: 0.0 standard drinks  . Drug use: No  . Sexual activity: Not Currently    Birth control/protection: Surgical

## 2019-07-21 ENCOUNTER — Other Ambulatory Visit: Payer: Self-pay | Admitting: Cardiology

## 2019-07-25 ENCOUNTER — Encounter (INDEPENDENT_AMBULATORY_CARE_PROVIDER_SITE_OTHER): Payer: Medicare Other | Admitting: Ophthalmology

## 2019-08-01 ENCOUNTER — Telehealth (INDEPENDENT_AMBULATORY_CARE_PROVIDER_SITE_OTHER): Payer: Medicare HMO | Admitting: Cardiology

## 2019-08-01 ENCOUNTER — Encounter: Payer: Self-pay | Admitting: Cardiology

## 2019-08-01 VITALS — BP 160/66 | Ht 62.0 in | Wt 175.0 lb

## 2019-08-01 DIAGNOSIS — E782 Mixed hyperlipidemia: Secondary | ICD-10-CM

## 2019-08-01 DIAGNOSIS — I10 Essential (primary) hypertension: Secondary | ICD-10-CM

## 2019-08-01 DIAGNOSIS — I2510 Atherosclerotic heart disease of native coronary artery without angina pectoris: Secondary | ICD-10-CM

## 2019-08-01 NOTE — Progress Notes (Signed)
Virtual Visit via Telephone Note   This visit type was conducted due to national recommendations for restrictions regarding the COVID-19 Pandemic (e.g. social distancing) in an effort to limit this patient's exposure and mitigate transmission in our community.  Due to her co-morbid illnesses, this patient is at least at moderate risk for complications without adequate follow up.  This format is felt to be most appropriate for this patient at this time.  The patient did not have access to video technology/had technical difficulties with video requiring transitioning to audio format only (telephone).  All issues noted in this document were discussed and addressed.  No physical exam could be performed with this format.  Please refer to the patient's chart for her  consent to telehealth for [facility]   Date:  08/01/2019   ID:  Carrie Gillespie, DOB 1919/11/27, MRN [id number]  Patient Location: Home Provider Location: Office  PCP:  Curlene Labrum, MD  Cardiologist:  Carlyle Dolly, MD  Electrophysiologist:  None   Evaluation Performed:  Follow-Up Visit  Chief Complaint:  Follow up visit   History of Present Illness:    Carrie Gillespie is a 85 y.o. female seen today for follow up of the following medical problems.    1. CAD - history of stent to RCA in 2004. Nuclear stress negative for ischemia in 2008 and 2012 - echo 07/2013 LVEF 65-70%, - 02/2016 lexiscan without significant ischemia  - seen at [facility] ER with neck and shoulder pain 06/2018 - Trop neg x 2, EKG SR without acute ischemic changes. CXR no acute findings. - pain in neck into left shoulder and arm. Sharp pain, lastsing a few a seconds at a time. No other associated symptoms.   - denies any chest pain. No SOB/DOE - compilant with meds. She is off ASA for unclear reasons, denies any bleeding or GI issues  2. HTN - has not taken meds yet today, home bps is elevated SBP 160  3. Hyperlipidemia  11/2018  TC 186 TG 245 HDL 43 LDL 94 - compliant with statin   The patient does not have symptoms concerning for COVID-19 infection (fever, chills, cough, or new shortness of breath).    Past Medical History:  Diagnosis Date  . Anxiety   . Arthritis   . Carotid artery disease ([place])    with an occluded right vertebral artery, otherwise nonobstructive carotid artery disease  . Coronary atherosclerosis of native coronary artery    Status post stent to right coronary 2004, catheterization 2005 nonobstructive CAD, normal LV function, Cardiolite study 2008 negative for ischemia. Residual moderate ostial RCA disease. Stress testing 2012 negative for ischemia ejection fraction 78%  . Cough   . Depression    takes Cymbalta daily  . Diverticulosis   . GERD (gastroesophageal reflux disease)    takes Omeprazole daily  . History of blood transfusion    no abnormal reaction noted  . History of bronchitis    > 31yrs ago   . Joint pain   . Macular degeneration    dry  . Nerve entrapment syndrome of lower extremity     status post prior back surgery and weakness in left leg  . Other and unspecified hyperlipidemia    takes Pravastatin daily  . Peripheral neuropathy   . Peripheral vascular disease, unspecified ([place])    Minimal internal carotid artery disease  . Seizures ([place])    takes Tegretol daily;has only had 1 seizure and that was several yrs  ago.  . Unspecified essential hypertension    takes Lisinopril,Metoprolol,and Imdur daily   Past Surgical History:  Procedure Laterality Date  . BACK SURGERY  10/14  . CARDIAC CATHETERIZATION     nonobstructive coronary artery disease  . CATARACT EXTRACTION    . COLONOSCOPY    . CORONARY ANGIOPLASTY  [id number]  . KIDNEY SURGERY  2010   1/4 of right kidney removed [facility]  . KNEE ARTHROPLASTY Left 2004  . PARTIAL HYSTERECTOMY    . TOTAL HIP ARTHROPLASTY Left 12/24/2014   Procedure: TOTAL HIP ARTHROPLASTY;  Surgeon: Garald Balding, MD;  Location: [facility];  Service: Orthopedics;  Laterality: Left;  . TOTAL HIP ARTHROPLASTY Right 07/08/2015  . TOTAL HIP ARTHROPLASTY Right 07/08/2015   Procedure: TOTAL HIP ARTHROPLASTY;  Surgeon: Garald Balding, MD;  Location: [facility];  Service: Orthopedics;  Laterality: Right;     No outpatient medications have been marked as taking for the 08/01/19 encounter (Appointment) with Carrie Lenis, MD.     Allergies:   Latex   Social History   Tobacco Use  . Smoking status: Never Smoker  . Smokeless tobacco: Never Used  Substance Use Topics  . Alcohol use: No    Alcohol/week: 0.0 standard drinks  . Drug use: No     Family Hx: The patient's family history includes Cancer in her mother; Diabetes in her mother and another family member; Hypertension in an other family member; Kidney disease in her sister.  ROS:   Please see the history of present illness.     All other systems reviewed and are negative.   Prior CV studies:   The following studies were reviewed today:  07/2013 Carotid US Summary: Right: moderate intimal wall thickening CCA. Mild mixed plaque origin ICA. Left: mild sof tplaque distal CCA. Mild calcific plaque origin ICA with acoustic shadowing. Bilateral: 1-395 ICA stenosis. Vertebral artery flow is antegrade. ICA/CCA ratio: R-0.59 L-1.0   02/2016 Lexiscan Nuclear stress  Horizontal ST segment depression after Lexiscan injection. ST segment depression was noted during stress in the II, III, aVF, V5 and V6 leads. Nonspecific finding in setting of severe hypertension  The study is normal. There are no perfusion defects consistent with prior infarction or current ischemia.  This is a low risk study.  The left ventricular ejection fraction is hyperdynamic (>65%).  Labs/Other Tests and Data Reviewed:    EKG:  No ECG reviewed.  Recent Labs: No results found for requested labs within last 8760 hours.   Recent Lipid Panel No results found for: CHOL, TRIG, HDL,  CHOLHDL, LDLCALC, LDLDIRECT  Wt Readings from Last 3 Encounters:  04/04/19 185 lb (83.9 kg)  03/14/19 184 lb (83.5 kg)  02/28/19 184 lb (83.5 kg)     Objective:    Vital Signs:  Normal affect. Normal speech pattern and tone. Comfortable, no apparent distress. No audible signs of SOB or wheezing.  ASSESSMENT & PLAN:    1. CAD - no recent symptoms. She will restart ASA 81mg  daily.    2. HTN - elevated today but has not taken meds yet, prior bp's have been at goal -continue current meds, in general conservative target for her is reasonable given advanced age  41. Hyperlpidemia - reasonable control given advanced age and high risk for side effects, continue current statin.   Requst records from [facility] for recent admission, from speaking with family does not sound like it was a primary cardiac issue. Has pcp f/u later this week  F/u 6 months COVID-19 Education: The signs and symptoms of COVID-19 were discussed with the patient and how to seek care for testing (follow up with PCP or arrange E-visit).  The importance of social distancing was discussed today.  Time:   Today, I have spent 16 minutes with the patient with telehealth technology discussing the above problems.     Medication Adjustments/Labs and Tests Ordered: Current medicines are reviewed at length with the patient today.  Concerns regarding medicines are outlined above.   Tests Ordered: No orders of the defined types were placed in this encounter.   Medication Changes: No orders of the defined types were placed in this encounter.   Follow Up:  Virtual Visit  in 6 month(s)  Signed, Carlyle Dolly, MD  08/01/2019 8:42 AM    [place]

## 2019-08-01 NOTE — Patient Instructions (Signed)

## 2019-08-13 ENCOUNTER — Ambulatory Visit (INDEPENDENT_AMBULATORY_CARE_PROVIDER_SITE_OTHER): Payer: Medicare HMO | Admitting: Otolaryngology

## 2019-12-05 ENCOUNTER — Telehealth: Payer: Self-pay | Admitting: Physical Medicine and Rehabilitation

## 2019-12-05 NOTE — Telephone Encounter (Signed)
ok 

## 2019-12-06 NOTE — Telephone Encounter (Signed)
Needs auth for . Scheduled for 3/25.

## 2019-12-10 NOTE — Telephone Encounter (Signed)
Pt no longer has [employer], they have medicare and does not require prior authorization.

## 2019-12-19 ENCOUNTER — Encounter (INDEPENDENT_AMBULATORY_CARE_PROVIDER_SITE_OTHER): Payer: Medicare Other | Admitting: Ophthalmology

## 2019-12-20 ENCOUNTER — Ambulatory Visit (INDEPENDENT_AMBULATORY_CARE_PROVIDER_SITE_OTHER): Payer: Medicare HMO | Admitting: Physical Medicine and Rehabilitation

## 2019-12-20 ENCOUNTER — Other Ambulatory Visit: Payer: Self-pay

## 2019-12-20 ENCOUNTER — Encounter: Payer: Self-pay | Admitting: Physical Medicine and Rehabilitation

## 2019-12-20 ENCOUNTER — Ambulatory Visit: Payer: Self-pay

## 2019-12-20 VITALS — BP 135/68 | HR 66

## 2019-12-20 DIAGNOSIS — M5416 Radiculopathy, lumbar region: Secondary | ICD-10-CM | POA: Diagnosis not present

## 2019-12-20 DIAGNOSIS — M961 Postlaminectomy syndrome, not elsewhere classified: Secondary | ICD-10-CM

## 2019-12-20 DIAGNOSIS — M48062 Spinal stenosis, lumbar region with neurogenic claudication: Secondary | ICD-10-CM | POA: Diagnosis not present

## 2019-12-20 MED ORDER — METHYLPREDNISOLONE ACETATE 80 MG/ML IJ SUSP
40.0000 mg | Freq: Once | INTRAMUSCULAR | Status: AC
  Administered 2019-12-20: 40 mg

## 2019-12-20 NOTE — Progress Notes (Signed)
Numeric Pain Rating Scale and Functional Assessment Average Pain 8   In the last MONTH (on 0-10 scale) has pain interfered with the following?  1. General activity like being  able to carry out your everyday physical activities such as walking, climbing stairs, carrying groceries, or moving a chair?  Rating(10)    +Driver, -BT, -Dye Allergies. 

## 2019-12-20 NOTE — Progress Notes (Signed)
Carrie Gillespie - 86 y.o. female MRN [id number]  Date of birth: 02/18/1920  Office Visit Note: Visit Date: 12/20/2019 PCP: Curlene Labrum, MD Referred by: Curlene Labrum, MD  Subjective: Chief Complaint  Patient presents with  . Lower Back - Pain  . Left Leg - Pain   HPI:  Carrie Gillespie is a 86 y.o. female who comes in today For planned repeat left L5 transforaminal epidural steroid injection for left L5 radiculopathy status post history of lumbar fusion.  She is having pretty classic left L5 symptoms that have actually been better for the last couple of days but still worse with standing and ambulating.  Her daughter is present who provides most of the history.  Patient has had multilevel fusion from L3-L5.  She does have adjacent level severe stenosis at L2-3 but prior L2 injection was not very beneficial at all.  Last injection performed in July was left L5 transforaminal injection helped her greatly.  She has had no red flag complaints or focal weakness or other issues since that time.  ROS Otherwise per HPI.  Assessment & Plan: Visit Diagnoses:  1. Lumbar radiculopathy   2. Post laminectomy syndrome   3. Spinal stenosis of lumbar region with neurogenic claudication     Plan: No additional findings.   Meds & Orders:  Meds ordered this encounter  Medications  . methylPREDNISolone acetate (DEPO-MEDROL) injection 40 mg    Orders Placed This Encounter  Procedures  . XR C-ARM NO REPORT  . Epidural Steroid injection    Follow-up: Return if symptoms worsen or fail to improve.   Procedures: No procedures performed  Lumbosacral Transforaminal Epidural Steroid Injection - Sub-Pedicular Approach with Fluoroscopic Guidance  Patient: Carrie Gillespie      Date of Birth: [birth year] MRN: [id number] PCP: Curlene Labrum, MD      Visit Date: 12/20/2019   Universal Protocol:    Date/Time: 12/20/2019  Consent Given By: the patient  Position: PRONE  Additional  Comments: Vital signs were monitored before and after the procedure. Patient was prepped and draped in the usual sterile fashion. The correct patient, procedure, and site was verified.   Injection Procedure Details:  Procedure Site One Meds Administered:  Meds ordered this encounter  Medications  . methylPREDNISolone acetate (DEPO-MEDROL) injection 40 mg    Laterality: Left  Location/Site:  L5-S1  Needle size: 22 G  Needle type: Spinal  Needle Placement: Transforaminal  Findings:    -Comments: Excellent flow of contrast along the nerve and into the epidural space.  Procedure Details: After squaring off the end-plates to get a true AP view, the C-arm was positioned so that an oblique view of the foramen as noted above was visualized. The target area is just inferior to the "nose of the scotty dog" or sub pedicular. The soft tissues overlying this structure were infiltrated with 2-3 ml. of 1% Lidocaine without Epinephrine.  The spinal needle was inserted toward the target using a "trajectory" view along the fluoroscope beam.  Under AP and lateral visualization, the needle was advanced so it did not puncture dura and was located close the 6 O'Clock position of the pedical in AP tracterory. Biplanar projections were used to confirm position. Aspiration was confirmed to be negative for CSF and/or blood. A 1-2 ml. volume of Isovue-250 was injected and flow of contrast was noted at each level. Radiographs were obtained for documentation purposes.   After attaining the desired flow of contrast documented above,  a 0.5 to 1.0 ml test dose of 0.25% Marcaine was injected into each respective transforaminal space.  The patient was observed for 90 seconds post injection.  After no sensory deficits were reported, and normal lower extremity motor function was noted,   the above injectate was administered so that equal amounts of the injectate were placed at each foramen (level) into the  transforaminal epidural space.   Additional Comments:  The patient tolerated the procedure well Dressing: 2 x 2 sterile gauze and Band-Aid    Post-procedure details: Patient was observed during the procedure. Post-procedure instructions were reviewed.  Patient left the clinic in stable condition.     Clinical History: MRI LUMBAR SPINE WITHOUT CONTRAST  TECHNIQUE: Multiplanar, multisequence MR imaging of the lumbar spine was performed. No intravenous contrast was administered.  COMPARISON: CT abdomen pelvis dated September 08, 2018. MRI lumbar spine dated June 18, 2013.  FINDINGS: Segmentation: Standard.  Alignment: Unchanged trace retrolisthesis at L1-L2 and L2-L3. Unchanged fused trace anterolisthesis at L4-L5.  Vertebrae: Prior L3-L5 PLIF with solid osseous fusion. No fracture, evidence of discitis, or focal bone lesion. Degenerative endplate marrow edema at L2-L3.  Conus medullaris and cauda equina: Conus extends to the T12-L1 level. Conus and cauda equina appear normal.  Paraspinal and other soft tissues: Large left renal cyst again noted. Sigmoid diverticulosis.  Disc levels:  T12-L1: Increased disc bulging with superimposed right-sided disc protrusion. New mild to moderate right lateral recess and neuroforaminal stenosis. No spinal canal or left neuroforaminal stenosis.  L1-L2: Progressive disc bulging with new superimposed small left subarticular disc protrusion. New mild spinal canal stenosis and progressive now moderate left and mild right lateral recess stenosis. No neuroforaminal stenosis.  L2-L3: Progressive disc bulging and posterior element hypertrophy with progressive now severe spinal canal and left greater than right lateral recess stenosis. New mild to moderate left neuroforaminal stenosis. No right neuroforaminal stenosis.  L3-L4: Prior PLIF. No residual stenosis.  L4-L5: Prior PLIF. No residual stenosis.  L5-S1: Unchanged small  shallow broad-based posterior disc protrusion and mild bilateral facet arthropathy. No stenosis.  IMPRESSION: 1. Prior L3-L5 PLIF with progressive adjacent segment degenerative disc disease at L2-L3, now with severe spinal canal stenosis. 2. Progressive degenerative disc disease at T12-L1 and L1-L2 as described above.   Electronically Signed By: Titus Dubin M.D. On: 03/06/2019 17:10     Objective:  VS:  HT:    WT:   BMI:     BP:135/68  HR:66bpm  TEMP: ( )  RESP:  Physical Exam  Ortho Exam Imaging: XR C-ARM NO REPORT  Result Date: 12/20/2019 Please see Notes tab for imaging impression.

## 2019-12-20 NOTE — Procedures (Signed)
Lumbosacral Transforaminal Epidural Steroid Injection - Sub-Pedicular Approach with Fluoroscopic Guidance  Patient: Carrie Gillespie      Date of Birth: 02/03/1920 MRN: [id number] PCP: Curlene Labrum, MD      Visit Date: 12/20/2019   Universal Protocol:    Date/Time: 12/20/2019  Consent Given By: the patient  Position: PRONE  Additional Comments: Vital signs were monitored before and after the procedure. Patient was prepped and draped in the usual sterile fashion. The correct patient, procedure, and site was verified.   Injection Procedure Details:  Procedure Site One Meds Administered:  Meds ordered this encounter  Medications  . methylPREDNISolone acetate (DEPO-MEDROL) injection 40 mg    Laterality: Left  Location/Site:  L5-S1  Needle size: 22 G  Needle type: Spinal  Needle Placement: Transforaminal  Findings:    -Comments: Excellent flow of contrast along the nerve and into the epidural space.  Procedure Details: After squaring off the end-plates to get a true AP view, the C-arm was positioned so that an oblique view of the foramen as noted above was visualized. The target area is just inferior to the "nose of the scotty dog" or sub pedicular. The soft tissues overlying this structure were infiltrated with 2-3 ml. of 1% Lidocaine without Epinephrine.  The spinal needle was inserted toward the target using a "trajectory" view along the fluoroscope beam.  Under AP and lateral visualization, the needle was advanced so it did not puncture dura and was located close the 6 O'Clock position of the pedical in AP tracterory. Biplanar projections were used to confirm position. Aspiration was confirmed to be negative for CSF and/or blood. A 1-2 ml. volume of Isovue-250 was injected and flow of contrast was noted at each level. Radiographs were obtained for documentation purposes.   After attaining the desired flow of contrast documented above, a 0.5 to 1.0 ml test dose of 0.25%  Marcaine was injected into each respective transforaminal space.  The patient was observed for 90 seconds post injection.  After no sensory deficits were reported, and normal lower extremity motor function was noted,   the above injectate was administered so that equal amounts of the injectate were placed at each foramen (level) into the transforaminal epidural space.   Additional Comments:  The patient tolerated the procedure well Dressing: 2 x 2 sterile gauze and Band-Aid    Post-procedure details: Patient was observed during the procedure. Post-procedure instructions were reviewed.  Patient left the clinic in stable condition.

## 2020-02-09 ENCOUNTER — Other Ambulatory Visit: Payer: Self-pay | Admitting: Cardiology

## 2020-02-15 ENCOUNTER — Telehealth: Payer: Self-pay | Admitting: Cardiology

## 2020-02-15 NOTE — Telephone Encounter (Signed)
  Patient Consent for Virtual Visit         Carrie Gillespie has provided verbal consent on 02/15/2020 for a virtual visit (video or telephone).   CONSENT FOR VIRTUAL VISIT FOR:  Carrie Gillespie  By participating in this virtual visit I agree to the following:  I hereby voluntarily request, consent and authorize [facility] and its employed or contracted physicians, physician assistants, nurse practitioners or other licensed health care professionals (the Practitioner), to provide me with telemedicine health care services (the "Services") as deemed necessary by the treating Practitioner. I acknowledge and consent to receive the Services by the Practitioner via telemedicine. I understand that the telemedicine visit will involve communicating with the Practitioner through live audiovisual communication technology and the disclosure of certain medical information by electronic transmission. I acknowledge that I have been given the opportunity to request an in-person assessment or other available alternative prior to the telemedicine visit and am voluntarily participating in the telemedicine visit.  I understand that I have the right to withhold or withdraw my consent to the use of telemedicine in the course of my care at any time, without affecting my right to future care or treatment, and that the Practitioner or I may terminate the telemedicine visit at any time. I understand that I have the right to inspect all information obtained and/or recorded in the course of the telemedicine visit and may receive copies of available information for a reasonable fee.  I understand that some of the potential risks of receiving the Services via telemedicine include:  Marland Kitchen Delay or interruption in medical evaluation due to technological equipment failure or disruption; . Information transmitted may not be sufficient (e.g. poor resolution of images) to allow for appropriate medical decision making by the Practitioner;  and/or  . In rare instances, security protocols could fail, causing a breach of personal health information.  Furthermore, I acknowledge that it is my responsibility to provide information about my medical history, conditions and care that is complete and accurate to the best of my ability. I acknowledge that Practitioner's advice, recommendations, and/or decision may be based on factors not within their control, such as incomplete or inaccurate data provided by me or distortions of diagnostic images or specimens that may result from electronic transmissions. I understand that the practice of medicine is not an exact science and that Practitioner makes no warranties or guarantees regarding treatment outcomes. I acknowledge that a copy of this consent can be made available to me via my patient portal ([facility]), or I can request a printed copy by calling the office of [facility].    I understand that my insurance will be billed for this visit.   I have read or had this consent read to me. . I understand the contents of this consent, which adequately explains the benefits and risks of the Services being provided via telemedicine.  . I have been provided ample opportunity to ask questions regarding this consent and the Services and have had my questions answered to my satisfaction. . I give my informed consent for the services to be provided through the use of telemedicine in my medical care

## 2020-02-18 ENCOUNTER — Telehealth (INDEPENDENT_AMBULATORY_CARE_PROVIDER_SITE_OTHER): Payer: Medicare HMO | Admitting: Cardiology

## 2020-02-18 ENCOUNTER — Encounter: Payer: Self-pay | Admitting: Cardiology

## 2020-02-18 VITALS — BP 147/80 | Ht 62.0 in | Wt 165.0 lb

## 2020-02-18 DIAGNOSIS — I2510 Atherosclerotic heart disease of native coronary artery without angina pectoris: Secondary | ICD-10-CM

## 2020-02-18 DIAGNOSIS — E782 Mixed hyperlipidemia: Secondary | ICD-10-CM | POA: Diagnosis not present

## 2020-02-18 DIAGNOSIS — I10 Essential (primary) hypertension: Secondary | ICD-10-CM

## 2020-02-18 MED ORDER — FUROSEMIDE 20 MG PO TABS: 20.0000 mg | ORAL_TABLET | Freq: Every day | ORAL | 1 refills | Status: DC

## 2020-02-18 NOTE — Addendum Note (Signed)
Addended by: Julian Hy T on: 02/18/2020 09:05 AM   Modules accepted: Orders

## 2020-02-18 NOTE — Progress Notes (Signed)
Virtual Visit via Telephone Note   This visit type was conducted due to national recommendations for restrictions regarding the COVID-19 Pandemic (e.g. social distancing) in an effort to limit this patient's exposure and mitigate transmission in our community.  Due to her co-morbid illnesses, this patient is at least at moderate risk for complications without adequate follow up.  This format is felt to be most appropriate for this patient at this time.  The patient did not have access to video technology/had technical difficulties with video requiring transitioning to audio format only (telephone).  All issues noted in this document were discussed and addressed.  No physical exam could be performed with this format.  Please refer to the patient's chart for her  consent to telehealth for [facility].   The patient was identified using 2 identifiers.  Date:  02/18/2020   ID:  Carrie Gillespie, DOB 1919/11/27, MRN [id number]  Patient Location: Home Provider Location: Office  PCP:  Curlene Labrum, MD  Cardiologist:  Carlyle Dolly, MD  Electrophysiologist:  None   Evaluation Performed:  Follow-Up Visit  Chief Complaint:  Follow up visit  History of Present Illness:    Carrie Gillespie is a 86 y.o. female seen today for follow up of the following medical problems.   1. CAD - history of stent to RCA in 2004. Nuclear stress negative for ischemia in 2008 and 2012 - echo 07/2013 LVEF 65-70%, - 02/2016 lexiscan without significant ischemia  - seen at [facility] ER with neck and shoulderpain 06/2018 - Trop neg x 2, EKG SR without acute ischemic changes. CXR no acute findings. - pain in neck into left shoulder and arm. Sharp pain, lastsing a few a seconds at a time. No other associated symptoms.   - She is off ASA for unclear reasons, denies any bleeding or GI issues. Did not restart since last visit  - exertion limited by chronic leg weakness - no recent chest pain, no SOB or  DOE.   2. HTN -compliant with meds  3. Hyperlipidemia 11/2018 TC 186 TG 245 HDL 43 LDL 94 - labs followed by pcp - compliant with statin     Completed covid vaccine x 2.    The patient does not have symptoms concerning for COVID-19 infection (fever, chills, cough, or new shortness of breath).    Past Medical History:  Diagnosis Date  . Anxiety   . Arthritis   . Carotid artery disease ([place])    with an occluded right vertebral artery, otherwise nonobstructive carotid artery disease  . Coronary atherosclerosis of native coronary artery    Status post stent to right coronary 2004, catheterization 2005 nonobstructive CAD, normal LV function, Cardiolite study 2008 negative for ischemia. Residual moderate ostial RCA disease. Stress testing 2012 negative for ischemia ejection fraction 78%  . Cough   . Depression    takes Cymbalta daily  . Diverticulosis   . GERD (gastroesophageal reflux disease)    takes Omeprazole daily  . History of blood transfusion    no abnormal reaction noted  . History of bronchitis    > 62yrs ago   . Joint pain   . Macular degeneration    dry  . Nerve entrapment syndrome of lower extremity     status post prior back surgery and weakness in left leg  . Other and unspecified hyperlipidemia    takes Pravastatin daily  . Peripheral neuropathy   . Peripheral vascular disease, unspecified ([place])    Minimal  internal carotid artery disease  . Seizures ([place])    takes Tegretol daily;has only had 1 seizure and that was several yrs ago.  Marland Kitchen Unspecified essential hypertension    takes Lisinopril,Metoprolol,and Imdur daily   Past Surgical History:  Procedure Laterality Date  . BACK SURGERY  10/14  . CARDIAC CATHETERIZATION     nonobstructive coronary artery disease  . CATARACT EXTRACTION    . COLONOSCOPY    . CORONARY ANGIOPLASTY  [id number]  . KIDNEY SURGERY  2010   1/4 of right kidney removed [facility]  . KNEE ARTHROPLASTY Left 2004  . PARTIAL  HYSTERECTOMY    . TOTAL HIP ARTHROPLASTY Left 12/24/2014   Procedure: TOTAL HIP ARTHROPLASTY;  Surgeon: Garald Balding, MD;  Location: [facility];  Service: Orthopedics;  Laterality: Left;  . TOTAL HIP ARTHROPLASTY Right 07/08/2015  . TOTAL HIP ARTHROPLASTY Right 07/08/2015   Procedure: TOTAL HIP ARTHROPLASTY;  Surgeon: Garald Balding, MD;  Location: [facility];  Service: Orthopedics;  Laterality: Right;     Current Meds  Medication Sig  . acetaminophen (TYLENOL) 325 MG tablet Take 650 mg by mouth daily.  Marland Kitchen amLODipine (NORVASC) 10 MG tablet Take 1 tablet by mouth once daily  . carBAMazepine (TEGRETOL-XR PO) Take by mouth. 2 tablets daily  . diphenhydrAMINE (BENADRYL) 25 MG tablet Take 25 mg by mouth every 6 (six) hours as needed for allergies.  . isosorbide mononitrate (IMDUR) 120 MG 24 hr tablet Take 120 mg by mouth daily. 1/2 tablet  . lisinopril (PRINIVIL,ZESTRIL) 10 MG tablet Take 10 mg by mouth daily.   . metoprolol (LOPRESSOR) 50 MG tablet Take 0.5 tablets (25 mg total) by mouth 2 (two) times daily.  . nitroGLYCERIN (NITROSTAT) 0.4 MG SL tablet DISSOLVE ONE TABLET UNDER THE TONGUE EVERY 5 MINUTES AS NEEDED FOR CHEST PAIN.  DO NOT EXCEED A TOTAL OF 3 DOSES IN 15 MINUTES  . omeprazole (PRILOSEC) 20 MG capsule Take 20 mg by mouth daily. OTC  . pravastatin (PRAVACHOL) 40 MG tablet Take 40 mg by mouth daily.  Marland Kitchen VITAMIN D PO Take by mouth.     Allergies:   Latex   Social History   Tobacco Use  . Smoking status: Never Smoker  . Smokeless tobacco: Never Used  Substance Use Topics  . Alcohol use: No    Alcohol/week: 0.0 standard drinks  . Drug use: No     Family Hx: The patient's family history includes Cancer in her mother; Diabetes in her mother and another family member; Hypertension in an other family member; Kidney disease in her sister.  ROS:   Please see the history of present illness.     All other systems reviewed and are negative.   Prior CV studies:   The following  studies were reviewed today:  07/2013 Carotid US Summary: Right: moderate intimal wall thickening CCA. Mild mixed plaque origin ICA. Left: mild sof tplaque distal CCA. Mild calcific plaque origin ICA with acoustic shadowing. Bilateral: 1-395 ICA stenosis. Vertebral artery flow is antegrade. ICA/CCA ratio: R-0.59 L-1.0   02/2016 Lexiscan Nuclear stress  Horizontal ST segment depression after Lexiscan injection. ST segment depression was noted during stress in the II, III, aVF, V5 and V6 leads. Nonspecific finding in setting of severe hypertension  The study is normal. There are no perfusion defects consistent with prior infarction or current ischemia.  This is a low risk study.  The left ventricular ejection fraction is hyperdynamic (>65%).  Labs/Other Tests and Data Reviewed:  EKG:  No ECG reviewed.  Recent Labs: No results found for requested labs within last 8760 hours.   Recent Lipid Panel No results found for: CHOL, TRIG, HDL, CHOLHDL, LDLCALC, LDLDIRECT  Wt Readings from Last 3 Encounters:  02/18/20 165 lb (74.8 kg)  08/01/19 175 lb (79.4 kg)  04/04/19 185 lb (83.9 kg)     Objective:    Vital Signs:  BP (!) 147/80   Ht 5\' 2"  (1.575 m)   Wt 165 lb (74.8 kg)   BMI 30.18 kg/m    Normal affect. Normal speech pattern and tone. Comfortable, no apparent distress. No audibel signs of sob or wheezing.   ASSESSMENT & PLAN:     1. CAD - no symptoms, continue current meds. We discussed again restarting her ASA 81mg  daily.    2. HTN - , in general conservative target for her is reasonable given advanced age - reasonable bp's today  3. Hyperlpidemia -continue statin, request pcp labs. Reasonable LDL given advanced age based on last labs       COVID-19 Education: The signs and symptoms of COVID-19 were discussed with the patient and how to seek care for testing (follow up with PCP or arrange E-visit).  The importance of social distancing was discussed  today.  Time:   Today, I have spent 14 minutes with the patient with telehealth technology discussing the above problems.     Medication Adjustments/Labs and Tests Ordered: Current medicines are reviewed at length with the patient today.  Concerns regarding medicines are outlined above.   Tests Ordered: No orders of the defined types were placed in this encounter.   Medication Changes: No orders of the defined types were placed in this encounter.   Follow Up:  Either In Person or Virtual in 6 month(s)  Signed, Carlyle Dolly, MD  02/18/2020 8:57 AM

## 2020-02-18 NOTE — Patient Instructions (Signed)
Your physician wants you to follow-up in: [place] will receive a reminder letter in the mail two months in advance. If you don't receive a letter, please call our office to schedule the follow-up appointment.  Your physician has recommended you make the following change in your medication:   RE START LASIX 20 MG DAILY   START ASPRIN 81 MG DAILY   Thank you for choosing [facility]!!

## 2020-03-24 ENCOUNTER — Other Ambulatory Visit: Payer: Self-pay | Admitting: Cardiology

## 2020-08-25 ENCOUNTER — Encounter: Payer: Self-pay | Admitting: *Deleted

## 2020-08-25 ENCOUNTER — Ambulatory Visit: Payer: Medicare HMO | Admitting: Cardiology

## 2020-08-25 ENCOUNTER — Other Ambulatory Visit: Payer: Self-pay

## 2020-08-25 ENCOUNTER — Encounter: Payer: Self-pay | Admitting: Cardiology

## 2020-08-25 VITALS — BP 128/58 | HR 71 | Ht 60.0 in | Wt 159.0 lb

## 2020-08-25 DIAGNOSIS — I2510 Atherosclerotic heart disease of native coronary artery without angina pectoris: Secondary | ICD-10-CM

## 2020-08-25 DIAGNOSIS — E782 Mixed hyperlipidemia: Secondary | ICD-10-CM

## 2020-08-25 DIAGNOSIS — I10 Essential (primary) hypertension: Secondary | ICD-10-CM | POA: Diagnosis not present

## 2020-08-25 NOTE — Patient Instructions (Signed)
Your physician wants you to follow-up in: [place] will receive a reminder letter in the mail two months in advance. If you don't receive a letter, please call our office to schedule the follow-up appointment.  Your physician recommends that you continue on your current medications as directed. Please refer to the Current Medication list given to you today.  TAKE ASPIRIN 81 MG DAILY   Thank you for choosing [facility]!!

## 2020-08-25 NOTE — Progress Notes (Signed)
Clinical Summary Carrie Gillespie is a 86 y.o.female seen today for follow up of the following medical problems.   1. CAD - history of stent to RCA in 2004. Nuclear stress negative for ischemia in 2008 and 2012 - echo 07/2013 LVEF 65-70%, - 02/2016 lexiscan without significant ischemia    - no recent chest pain. No SOB or DOE - compliatn with meds. She has been off ASA for unclear reasons, discussed at several other prior appts, she has not restarted at home as of yet  2. HTN -compliant with meds  3. Hyperlipidemia 11/2018 TC 186 TG 245 HDL 43 LDL 94 - labs followed by pcp - compliant with statin     Completed covid vaccine x 2.     Past Medical History:  Diagnosis Date  . Anxiety   . Arthritis   . Carotid artery disease ([place])    with an occluded right vertebral artery, otherwise nonobstructive carotid artery disease  . Coronary atherosclerosis of native coronary artery    Status post stent to right coronary 2004, catheterization 2005 nonobstructive CAD, normal LV function, Cardiolite study 2008 negative for ischemia. Residual moderate ostial RCA disease. Stress testing 2012 negative for ischemia ejection fraction 78%  . Cough   . Depression    takes Cymbalta daily  . Diverticulosis   . GERD (gastroesophageal reflux disease)    takes Omeprazole daily  . History of blood transfusion    no abnormal reaction noted  . History of bronchitis    > 57yrs ago   . Joint pain   . Macular degeneration    dry  . Nerve entrapment syndrome of lower extremity     status post prior back surgery and weakness in left leg  . Other and unspecified hyperlipidemia    takes Pravastatin daily  . Peripheral neuropathy   . Peripheral vascular disease, unspecified ([place])    Minimal internal carotid artery disease  . Seizures ([place])    takes Tegretol daily;has only had 1 seizure and that was several yrs ago.  Marland Kitchen Unspecified essential hypertension    takes  Lisinopril,Metoprolol,and Imdur daily     Allergies  Allergen Reactions  . Latex Itching and Rash     Current Outpatient Medications  Medication Sig Dispense Refill  . acetaminophen (TYLENOL) 325 MG tablet Take 650 mg by mouth daily.    Marland Kitchen amLODipine (NORVASC) 10 MG tablet Take 1 tablet by mouth once daily 90 tablet 2  . aspirin EC 81 MG tablet Take 81 mg by mouth daily.    . carBAMazepine (TEGRETOL-XR PO) Take by mouth. 2 tablets daily    . diphenhydrAMINE (BENADRYL) 25 MG tablet Take 25 mg by mouth every 6 (six) hours as needed for allergies.    . furosemide (LASIX) 20 MG tablet Take 1 tablet (20 mg total) by mouth daily. 90 tablet 1  . isosorbide mononitrate (IMDUR) 120 MG 24 hr tablet Take 120 mg by mouth daily. 1/2 tablet    . lisinopril (PRINIVIL,ZESTRIL) 10 MG tablet Take 10 mg by mouth daily.     . metoprolol (LOPRESSOR) 50 MG tablet Take 0.5 tablets (25 mg total) by mouth 2 (two) times daily.    . nitroGLYCERIN (NITROSTAT) 0.4 MG SL tablet DISSOLVE ONE TABLET UNDER THE TONGUE EVERY 5 MINUTES AS NEEDED FOR CHEST PAIN.  DO NOT EXCEED A TOTAL OF 3 DOSES IN 15 MINUTES 75 tablet 0  . omeprazole (PRILOSEC) 20 MG capsule Take 20 mg by mouth daily. OTC    .  pravastatin (PRAVACHOL) 40 MG tablet Take 40 mg by mouth daily.    Marland Kitchen VITAMIN D PO Take by mouth.     No current facility-administered medications for this visit.     Past Surgical History:  Procedure Laterality Date  . BACK SURGERY  10/14  . CARDIAC CATHETERIZATION     nonobstructive coronary artery disease  . CATARACT EXTRACTION    . COLONOSCOPY    . CORONARY ANGIOPLASTY  [id number]  . KIDNEY SURGERY  2010   1/4 of right kidney removed [facility]  . KNEE ARTHROPLASTY Left 2004  . PARTIAL HYSTERECTOMY    . TOTAL HIP ARTHROPLASTY Left 12/24/2014   Procedure: TOTAL HIP ARTHROPLASTY;  Surgeon: Garald Balding, MD;  Location: [facility];  Service: Orthopedics;  Laterality: Left;  . TOTAL HIP ARTHROPLASTY Right 07/08/2015  . TOTAL  HIP ARTHROPLASTY Right 07/08/2015   Procedure: TOTAL HIP ARTHROPLASTY;  Surgeon: Garald Balding, MD;  Location: [facility];  Service: Orthopedics;  Laterality: Right;     Allergies  Allergen Reactions  . Latex Itching and Rash      Family History  Problem Relation Age of Onset  . Cancer Mother   . Diabetes Mother   . Kidney disease Sister   . Hypertension Other   . Diabetes Other      Social History Carrie Gillespie reports that she has never smoked. She has never used smokeless tobacco. Carrie Gillespie reports no history of alcohol use.   Review of Systems CONSTITUTIONAL: No weight loss, fever, chills, weakness or fatigue.  HEENT: Eyes: No visual loss, blurred vision, double vision or yellow sclerae.No hearing loss, sneezing, congestion, runny nose or sore throat.  SKIN: No rash or itching.  CARDIOVASCULAR: per hpi RESPIRATORY: No shortness of breath, cough or sputum.  GASTROINTESTINAL: No anorexia, nausea, vomiting or diarrhea. No abdominal pain or blood.  GENITOURINARY: No burning on urination, no polyuria NEUROLOGICAL: No headache, dizziness, syncope, paralysis, ataxia, numbness or tingling in the extremities. No change in bowel or bladder control.  MUSCULOSKELETAL: No muscle, back pain, joint pain or stiffness.  LYMPHATICS: No enlarged nodes. No history of splenectomy.  PSYCHIATRIC: No history of depression or anxiety.  ENDOCRINOLOGIC: No reports of sweating, cold or heat intolerance. No polyuria or polydipsia.  Marland Kitchen   Physical Examination Today's Vitals   08/25/20 1410  BP: (!) 128/58  Pulse: 71  SpO2: 98%  Weight: 159 lb (72.1 kg)  Height: 5' (1.524 m)   Body mass index is 31.05 kg/m.  Gen: resting comfortably, no acute distress HEENT: no scleral icterus, pupils equal round and reactive, no palptable cervical adenopathy,  CV: RRR, no m/rg, no jvd Resp: Clear to auscultation bilaterally GI: abdomen is soft, non-tender, non-distended, normal bowel sounds, no  hepatosplenomegaly MSK: extremities are warm, no edema.  Skin: warm, no rash Neuro:  no focal deficits Psych: appropriate affect   Diagnostic Studies 07/2013 Carotid US Summary: Right: moderate intimal wall thickening CCA. Mild mixed plaque origin ICA. Left: mild sof tplaque distal CCA. Mild calcific plaque origin ICA with acoustic shadowing. Bilateral: 1-395 ICA stenosis. Vertebral artery flow is antegrade. ICA/CCA ratio: R-0.59 L-1.0   02/2016 Lexiscan Nuclear stress  Horizontal ST segment depression after Lexiscan injection. ST segment depression was noted during stress in the II, III, aVF, V5 and V6 leads. Nonspecific finding in setting of severe hypertension  The study is normal. There are no perfusion defects consistent with prior infarction or current ischemia.  This is a low risk study.  The  left ventricular ejection fraction is hyperdynamic (>65%).     Assessment and Plan  1. CAD -no recent symptoms, we discussed again today restarting her ASA 81mg  daily at home -EKG shows SR, no acute ischemic changes  2. HTN - , in general conservative target for her is reasonable given advanced age - bp at goal,continue current meds  3. Hyperlpidemia -request pcp labs, continue statin     Arnoldo Lenis, M.D.

## 2020-10-11 ENCOUNTER — Other Ambulatory Visit: Payer: Self-pay | Admitting: Cardiology

## 2021-02-19 ENCOUNTER — Ambulatory Visit: Payer: Medicare HMO | Admitting: Cardiology

## 2021-02-19 ENCOUNTER — Encounter: Payer: Self-pay | Admitting: Cardiology

## 2021-02-19 VITALS — BP 118/56 | HR 70 | Ht 62.0 in | Wt 158.0 lb

## 2021-02-19 DIAGNOSIS — I2510 Atherosclerotic heart disease of native coronary artery without angina pectoris: Secondary | ICD-10-CM

## 2021-02-19 DIAGNOSIS — E782 Mixed hyperlipidemia: Secondary | ICD-10-CM

## 2021-02-19 DIAGNOSIS — I10 Essential (primary) hypertension: Secondary | ICD-10-CM | POA: Diagnosis not present

## 2021-02-19 NOTE — Patient Instructions (Signed)
Medication Instructions:  Continue all current medications.   Labwork: none  Testing/Procedures: none  Follow-Up: 6 months   Any Other Special Instructions Will Be Listed Below (If Applicable).   If you need a refill on your cardiac medications before your next appointment, please call your pharmacy.  

## 2021-02-19 NOTE — Progress Notes (Signed)
Clinical Summary Carrie Gillespie is a 87 y.o.female seen today for follow up of the following medical problems.   1. CAD - history of stent to RCA in 2004. Nuclear stress negative for ischemia in 2008 and 2012 - echo 07/2013 LVEF 65-70%, - 02/2016 lexiscan without significant ischemia   - no chest pains. No SOB or DOE - compliant with meds. Has not wanted to take daily aspirin at home, have discussed several last few visit.   2. HTN - compliant with meds  3. Hyperlipidemia 11/2018 TC 186 TG 245 HDL 43 LDL 94  - upcoming labs with pcp - compliant with statin  Past Medical History:  Diagnosis Date  . Anxiety   . Arthritis   . Carotid artery disease ([place])    with an occluded right vertebral artery, otherwise nonobstructive carotid artery disease  . Coronary atherosclerosis of native coronary artery    Status post stent to right coronary 2004, catheterization 2005 nonobstructive CAD, normal LV function, Cardiolite study 2008 negative for ischemia. Residual moderate ostial RCA disease. Stress testing 2012 negative for ischemia ejection fraction 78%  . Cough   . Depression    takes Cymbalta daily  . Diverticulosis   . GERD (gastroesophageal reflux disease)    takes Omeprazole daily  . History of blood transfusion    no abnormal reaction noted  . History of bronchitis    > 36yrs ago   . Joint pain   . Macular degeneration    dry  . Nerve entrapment syndrome of lower extremity     status post prior back surgery and weakness in left leg  . Other and unspecified hyperlipidemia    takes Pravastatin daily  . Peripheral neuropathy   . Peripheral vascular disease, unspecified ([place])    Minimal internal carotid artery disease  . Seizures ([place])    takes Tegretol daily;has only had 1 seizure and that was several yrs ago.  Marland Kitchen Unspecified essential hypertension    takes Lisinopril,Metoprolol,and Imdur daily     Allergies  Allergen Reactions  . Latex Itching and Rash      Current Outpatient Medications  Medication Sig Dispense Refill  . acetaminophen (TYLENOL) 325 MG tablet Take 650 mg by mouth daily.    Marland Kitchen amLODipine (NORVASC) 10 MG tablet Take 1 tablet by mouth once daily 90 tablet 2  . aspirin EC 81 MG tablet Take 81 mg by mouth daily. Swallow whole.    . carBAMazepine (TEGRETOL-XR PO) Take by mouth. 2 tablets daily    . diphenhydrAMINE (BENADRYL) 25 MG tablet Take 25 mg by mouth every 6 (six) hours as needed for allergies.    . furosemide (LASIX) 20 MG tablet Take 1 tablet by mouth once daily 90 tablet 1  . isosorbide mononitrate (IMDUR) 120 MG 24 hr tablet Take 120 mg by mouth daily. 1/2 tablet    . lisinopril (PRINIVIL,ZESTRIL) 10 MG tablet Take 10 mg by mouth daily.     . metoprolol (LOPRESSOR) 50 MG tablet Take 0.5 tablets (25 mg total) by mouth 2 (two) times daily.    . nitroGLYCERIN (NITROSTAT) 0.4 MG SL tablet DISSOLVE ONE TABLET UNDER THE TONGUE EVERY 5 MINUTES AS NEEDED FOR CHEST PAIN.  DO NOT EXCEED A TOTAL OF 3 DOSES IN 15 MINUTES 75 tablet 0  . pravastatin (PRAVACHOL) 40 MG tablet Take 40 mg by mouth daily.    Marland Kitchen VITAMIN D PO Take by mouth.     No current facility-administered medications for this  visit.     Past Surgical History:  Procedure Laterality Date  . BACK SURGERY  10/14  . CARDIAC CATHETERIZATION     nonobstructive coronary artery disease  . CATARACT EXTRACTION    . COLONOSCOPY    . CORONARY ANGIOPLASTY  [id number]  . KIDNEY SURGERY  2010   1/4 of right kidney removed [facility]  . KNEE ARTHROPLASTY Left 2004  . PARTIAL HYSTERECTOMY    . TOTAL HIP ARTHROPLASTY Left 12/24/2014   Procedure: TOTAL HIP ARTHROPLASTY;  Surgeon: Garald Balding, MD;  Location: [facility];  Service: Orthopedics;  Laterality: Left;  . TOTAL HIP ARTHROPLASTY Right 07/08/2015  . TOTAL HIP ARTHROPLASTY Right 07/08/2015   Procedure: TOTAL HIP ARTHROPLASTY;  Surgeon: Garald Balding, MD;  Location: [facility];  Service: Orthopedics;  Laterality: Right;      Allergies  Allergen Reactions  . Latex Itching and Rash      Family History  Problem Relation Age of Onset  . Cancer Mother   . Diabetes Mother   . Kidney disease Sister   . Hypertension Other   . Diabetes Other      Social History Carrie Gillespie reports that she has never smoked. She has never used smokeless tobacco. Carrie Gillespie reports no history of alcohol use.   Review of Systems CONSTITUTIONAL: No weight loss, fever, chills, weakness or fatigue.  HEENT: Eyes: No visual loss, blurred vision, double vision or yellow sclerae.No hearing loss, sneezing, congestion, runny nose or sore throat.  SKIN: No rash or itching.  CARDIOVASCULAR: per hpi  RESPIRATORY: No shortness of breath, cough or sputum.  GASTROINTESTINAL: No anorexia, nausea, vomiting or diarrhea. No abdominal pain or blood.  GENITOURINARY: No burning on urination, no polyuria NEUROLOGICAL: No headache, dizziness, syncope, paralysis, ataxia, numbness or tingling in the extremities. No change in bowel or bladder control.  MUSCULOSKELETAL: No muscle, back pain, joint pain or stiffness.  LYMPHATICS: No enlarged nodes. No history of splenectomy.  PSYCHIATRIC: No history of depression or anxiety.  ENDOCRINOLOGIC: No reports of sweating, cold or heat intolerance. No polyuria or polydipsia.  Marland Kitchen   Physical Examination Today's Vitals   02/19/21 1439  BP: (!) 118/56  Pulse: 70  SpO2: 97%  Weight: 158 lb (71.7 kg)  Height: 5\' 2"  (1.575 m)   Body mass index is 28.9 kg/m.  Gen: resting comfortably, no acute distress HEENT: no scleral icterus, pupils equal round and reactive, no palptable cervical adenopathy,  CV: RRR, no m/r/g, no jvd Resp: Clear to auscultation bilaterally GI: abdomen is soft, non-tender, non-distended, normal bowel sounds, no hepatosplenomegaly MSK: extremities are warm, no edema.  Skin: warm, no rash Neuro:  no focal deficits Psych: appropriate affect   Diagnostic Studies 07/2013 Carotid  US Summary: Right: moderate intimal wall thickening CCA. Mild mixed plaque origin ICA. Left: mild sof tplaque distal CCA. Mild calcific plaque origin ICA with acoustic shadowing. Bilateral: 1-395 ICA stenosis. Vertebral artery flow is antegrade. ICA/CCA ratio: R-0.59 L-1.0   02/2016 Lexiscan Nuclear stress  Horizontal ST segment depression after Lexiscan injection. ST segment depression was noted during stress in the II, III, aVF, V5 and V6 leads. Nonspecific finding in setting of severe hypertension  The study is normal. There are no perfusion defects consistent with prior infarction or current ischemia.  This is a low risk study.  The left ventricular ejection fraction is hyperdynamic (>65%).     Assessment and Plan   1. CAD -no symptoms - she hs not wanted to take a daily  aspirin at home, continue other meds  2. HTN - , in general conservative target for her is reasonable given advanced age - she is at goal, continue current meds  3. Hyperlpidemia -continue statin, request labs from pcp   Fu 6 months  Arnoldo Lenis, M.D.

## 2021-04-20 ENCOUNTER — Other Ambulatory Visit: Payer: Self-pay | Admitting: Cardiology

## 2021-06-12 ENCOUNTER — Encounter: Payer: Self-pay | Admitting: Neurology

## 2021-08-26 ENCOUNTER — Encounter: Payer: Self-pay | Admitting: *Deleted

## 2021-08-26 ENCOUNTER — Encounter: Payer: Self-pay | Admitting: Cardiology

## 2021-08-26 ENCOUNTER — Ambulatory Visit: Payer: Medicare HMO | Admitting: Cardiology

## 2021-08-26 VITALS — BP 138/60 | HR 67 | Resp 20 | Ht 61.0 in | Wt 159.0 lb

## 2021-08-26 DIAGNOSIS — I10 Essential (primary) hypertension: Secondary | ICD-10-CM | POA: Diagnosis not present

## 2021-08-26 DIAGNOSIS — E782 Mixed hyperlipidemia: Secondary | ICD-10-CM

## 2021-08-26 DIAGNOSIS — I2510 Atherosclerotic heart disease of native coronary artery without angina pectoris: Secondary | ICD-10-CM | POA: Diagnosis not present

## 2021-08-26 NOTE — Progress Notes (Signed)
Clinical Summary Carrie Gillespie is a 87 y.o.female seen today for follow up of the following medical problems.      1. CAD - history of stent to RCA in 2004. Nuclear stress negative for ischemia in 2008 and 2012 - echo 07/2013 LVEF 65-70%,  - 02/2016 lexiscan without significant ischemia     - no chest pains. No SOB/DOE - compliant with meds   2. HTN - she is compliant with meds   3. Hyperlipidemia  11/2018 TC 186 TG 245 HDL 43 LDL 94   - labs followed by pcp  - compliant with pravstatin   Past Medical History:  Diagnosis Date   Anxiety    Arthritis    Carotid artery disease ([place])    with an occluded right vertebral artery, otherwise nonobstructive carotid artery disease   Coronary atherosclerosis of native coronary artery    Status post stent to right coronary 2004, catheterization 2005 nonobstructive CAD, normal LV function, Cardiolite study 2008 negative for ischemia. Residual moderate ostial RCA disease. Stress testing 2012 negative for ischemia ejection fraction 78%   Cough    Depression    takes Cymbalta daily   Diverticulosis    GERD (gastroesophageal reflux disease)    takes Omeprazole daily   History of blood transfusion    no abnormal reaction noted   History of bronchitis    > 12yrs ago    Joint pain    Macular degeneration    dry   Nerve entrapment syndrome of lower extremity     status post prior back surgery and weakness in left leg   Other and unspecified hyperlipidemia    takes Pravastatin daily   Peripheral neuropathy    Peripheral vascular disease, unspecified ([place])    Minimal internal carotid artery disease   Seizures ([facility])    takes Tegretol daily;has only had 1 seizure and that was several yrs ago.   Unspecified essential hypertension    takes Lisinopril,Metoprolol,and Imdur daily     Allergies  Allergen Reactions   Latex Itching and Rash     Current Outpatient Medications  Medication Sig Dispense Refill   acetaminophen (TYLENOL)  325 MG tablet Take 650 mg by mouth daily.     amLODipine (NORVASC) 10 MG tablet Take 1 tablet by mouth once daily 90 tablet 2   furosemide (LASIX) 20 MG tablet Take 1 tablet by mouth once daily 90 tablet 3   isosorbide mononitrate (IMDUR) 120 MG 24 hr tablet Take 120 mg by mouth daily. 1/2 tablet     lisinopril (PRINIVIL,ZESTRIL) 10 MG tablet Take 10 mg by mouth daily.      Melatonin 10 MG TABS Take by mouth.     metoprolol (LOPRESSOR) 50 MG tablet Take 0.5 tablets (25 mg total) by mouth 2 (two) times daily.     Multiple Vitamins-Minerals (PRESERVISION AREDS PO) Take by mouth.     nitroGLYCERIN (NITROSTAT) 0.4 MG SL tablet DISSOLVE ONE TABLET UNDER THE TONGUE EVERY 5 MINUTES AS NEEDED FOR CHEST PAIN.  DO NOT EXCEED A TOTAL OF 3 DOSES IN 15 MINUTES 75 tablet 0   pantoprazole (PROTONIX) 40 MG tablet Take 1 tablet by mouth daily.     pravastatin (PRAVACHOL) 40 MG tablet Take 40 mg by mouth daily.     vitamin B-12 (CYANOCOBALAMIN) 100 MCG tablet Take 100 mcg by mouth daily.     VITAMIN D PO Take by mouth.     No current facility-administered medications for this visit.  Past Surgical History:  Procedure Laterality Date   BACK SURGERY  10/14   CARDIAC CATHETERIZATION     nonobstructive coronary artery disease   CATARACT EXTRACTION     COLONOSCOPY     CORONARY ANGIOPLASTY  1998-1999   KIDNEY SURGERY  2010   1/4 of right kidney removed [facility]   KNEE ARTHROPLASTY Left 2004   PARTIAL HYSTERECTOMY     TOTAL HIP ARTHROPLASTY Left 12/24/2014   Procedure: TOTAL HIP ARTHROPLASTY;  Surgeon: Garald Balding, MD;  Location: [facility];  Service: Orthopedics;  Laterality: Left;   TOTAL HIP ARTHROPLASTY Right 07/08/2015   TOTAL HIP ARTHROPLASTY Right 07/08/2015   Procedure: TOTAL HIP ARTHROPLASTY;  Surgeon: Garald Balding, MD;  Location: [facility];  Service: Orthopedics;  Laterality: Right;     Allergies  Allergen Reactions   Latex Itching and Rash      Family History  Problem Relation Age  of Onset   Cancer Mother    Diabetes Mother    Kidney disease Sister    Hypertension Other    Diabetes Other      Social History Ms. Forget reports that she has never smoked. She has never used smokeless tobacco. Ms. Campise reports no history of alcohol use.   Review of Systems CONSTITUTIONAL: No weight loss, fever, chills, weakness or fatigue.  HEENT: Eyes: No visual loss, blurred vision, double vision or yellow sclerae.No hearing loss, sneezing, congestion, runny nose or sore throat.  SKIN: No rash or itching.  CARDIOVASCULAR: per hpi RESPIRATORY: No shortness of breath, cough or sputum.  GASTROINTESTINAL: No anorexia, nausea, vomiting or diarrhea. No abdominal pain or blood.  GENITOURINARY: No burning on urination, no polyuria NEUROLOGICAL: No headache, dizziness, syncope, paralysis, ataxia, numbness or tingling in the extremities. No change in bowel or bladder control.  MUSCULOSKELETAL: No muscle, back pain, joint pain or stiffness.  LYMPHATICS: No enlarged nodes. No history of splenectomy.  PSYCHIATRIC: No history of depression or anxiety.  ENDOCRINOLOGIC: No reports of sweating, cold or heat intolerance. No polyuria or polydipsia.  Marland Kitchen   Physical Examination Today's Vitals   08/26/21 1438  BP: 138/60  Pulse: 67  Resp: 20  SpO2: 98%  Weight: 159 lb (72.1 kg)  Height: 5\' 1"  (1.549 m)  PainSc: 0-No pain   Body mass index is 30.04 kg/m.  Gen: resting comfortably, no acute distress HEENT: no scleral icterus, pupils equal round and reactive, no palptable cervical adenopathy,  CV: RRR, no mrg, no jvd Resp: Clear to auscultation bilaterally GI: abdomen is soft, non-tender, non-distended, normal bowel sounds, no hepatosplenomegaly MSK: extremities are warm, no edema.  Skin: warm, no rash Neuro:  no focal deficits Psych: appropriate affect   Diagnostic Studies 07/2013 Carotid US Summary: Right: moderate intimal wall thickening CCA. Mild mixed plaque origin ICA.  Left: mild sof tplaque distal CCA. Mild calcific plaque origin ICA with acoustic shadowing. Bilateral: 1-395 ICA stenosis. Vertebral artery flow is antegrade. ICA/CCA ratio: R-0.59 L-1.0     02/2016 Lexiscan Nuclear stress Horizontal ST segment depression after Lexiscan injection. ST segment depression was noted during stress in the II, III, aVF, V5 and V6 leads. Nonspecific finding in setting of severe hypertension The study is normal. There are no perfusion defects consistent with prior infarction or current ischemia. This is a low risk study. The left ventricular ejection fraction is hyperdynamic (>65%).      Assessment and Plan     1. CAD -doing well without symptoms, continue curernt meds - she has  consistently turned down being on a daily aspirin.    2. HTN - , in general conservative target for her is reasonable given advanced age - bp's reasonable today, continue current meds   3. Hyperlpidemia - request labs from pcp, continue pravastaitn.      Arnoldo Lenis, M.D.

## 2021-08-26 NOTE — Patient Instructions (Signed)

## 2021-08-28 ENCOUNTER — Ambulatory Visit: Payer: Medicare HMO | Admitting: Neurology

## 2022-05-14 ENCOUNTER — Telehealth: Payer: Self-pay | Admitting: Cardiology

## 2022-05-14 MED ORDER — FUROSEMIDE 20 MG PO TABS: 20.0000 mg | ORAL_TABLET | Freq: Every day | ORAL | 0 refills | Status: DC

## 2022-05-14 NOTE — Telephone Encounter (Signed)
*  STAT* If patient is at the pharmacy, call can be transferred to refill team.   1. Which medications need to be refilled? (please list name of each medication and dose if known) furosemide (LASIX) 20 MG tablet  2. Which pharmacy/location (including street and city if local pharmacy) is medication to be sent to? [place], [place].  3. Do they need a 30 day or 90 day supply? [place]

## 2022-05-21 ENCOUNTER — Telehealth: Payer: Self-pay | Admitting: Physical Medicine and Rehabilitation

## 2022-05-21 NOTE — Telephone Encounter (Signed)
Pt called to set an appt for back injection. Please call pt at [phone] or  [phone].

## 2022-06-03 ENCOUNTER — Ambulatory Visit: Payer: Medicare HMO | Admitting: Physical Medicine and Rehabilitation

## 2022-06-03 ENCOUNTER — Encounter: Payer: Self-pay | Admitting: Physical Medicine and Rehabilitation

## 2022-06-03 VITALS — BP 169/69 | HR 67

## 2022-06-03 DIAGNOSIS — M961 Postlaminectomy syndrome, not elsewhere classified: Secondary | ICD-10-CM

## 2022-06-03 DIAGNOSIS — R269 Unspecified abnormalities of gait and mobility: Secondary | ICD-10-CM | POA: Diagnosis not present

## 2022-06-03 DIAGNOSIS — M5416 Radiculopathy, lumbar region: Secondary | ICD-10-CM | POA: Diagnosis not present

## 2022-06-03 DIAGNOSIS — M48062 Spinal stenosis, lumbar region with neurogenic claudication: Secondary | ICD-10-CM | POA: Diagnosis not present

## 2022-06-03 NOTE — Progress Notes (Unsigned)
Pt state lower back pain that travels down her left leg and thigh. Pt state standing makes the pain worse. Pt state she takes over the counter pain meds to help ease her pain.  Numeric Pain Rating Scale and Functional Assessment Average Pain 10 Pain Right Now 10 My pain is constant, sharp, burning, dull, and aching Pain is worse with: walking, bending, sitting, standing, and some activites Pain improves with: heat/ice and medication   In the last MONTH (on 0-10 scale) has pain interfered with the following?  1. General activity like being  able to carry out your everyday physical activities such as walking, climbing stairs, carrying groceries, or moving a chair?  Rating(5)  2. Relation with others like being able to carry out your usual social activities and roles such as  activities at home, at work and in your community. Rating(6)  3. Enjoyment of life such that you have  been bothered by emotional problems such as feeling anxious, depressed or irritable?  Rating(7)

## 2022-06-03 NOTE — Progress Notes (Unsigned)
Carrie Gillespie - 88 y.o. female MRN [id number]  Date of birth: 1920/01/15  Office Visit Note: Visit Date: 06/03/2022 PCP: Curlene Labrum, MD Referred by: Curlene Labrum, MD  Subjective: Chief Complaint  Patient presents with   Lower Back - Pain   Left Leg - Pain   HPI: Carrie Gillespie is a 88 y.o. female who comes in today for evaluation of chronic, worsening and severe bilateral lower back pain radiating down left lateral leg. Pain ongoing for several years and is exacerbated by prolonged standing and walking.  She describes her pain as a sharp, sore and aching sensation, currently rates a 7 out of 10.  Some relief of pain at home with rest and use of medications.  Patient is currently attending in-home physical therapy with a focus on improving strength and mobility.  Lumbar MRI imaging from 2020 exhibits prior posterior lumbar interbody fusion at the levels of L3-L4 and L4-L5.  There is posterior element hypertrophy with progressive now severe spinal canal stenosis at the level of L2-L3 and unchanged small shallow broad-based posterior disc protrusion and mild bilateral facet arthropathy at L5-S1.  Patient has a history of lumbar epidural steroid injections performed in our office, most recent was left L5 transforaminal in 2021 which she reports provided significant and sustained relief of pain for over a year.  We also performed transforaminal epidural steroid injection at the level above her fusion at L2 in 2020 she reports only minimal pain relief for several days with this procedure.  Patient states she lives alone does have home health periodically through the week, states her severe pain is significantly affecting her mobility, states she is unable to stand or walk for any period of time.  Patient currently using rolling walker to assist with ambulation and prevent falls.  Patient denies focal weakness, numbness and tingling.  Patient denies recent trauma or falls.   +51 Review of  Systems  Musculoskeletal:  Positive for back pain.  Neurological:  Negative for tingling, sensory change, focal weakness and weakness.  All other systems reviewed and are negative.  Otherwise per HPI.  Assessment & Plan: Visit Diagnoses:    ICD-10-CM   1. Lumbar radiculopathy  M54.16 Ambulatory referral to Physical Medicine Rehab    2. Spinal stenosis of lumbar region with neurogenic claudication  M48.062 Ambulatory referral to Physical Medicine Rehab    3. Post laminectomy syndrome  M96.1 Ambulatory referral to Physical Medicine Rehab    4. Gait abnormality  R26.9 Ambulatory referral to Physical Medicine Rehab       Plan: Findings:  Chronic, worsening and severe bilateral lower back pain with radiation of pain down left lateral leg. Patient continues to have severe pain despite good conservative therapies such as rest and use of medications. Patients clinical presentation and exam are consistent with neurogenic claudication as a result of spinal canal stenosis. She does have severe spinal canal stenosis at the level above fusion on MRI imaging. Next step is to repeat left L5 transforaminal epidural steroid injection under fluoroscopic guidance. Patient is not a surgical candidate due to advanced age and other co-morbidies. No red flag symptoms noted upon exam today.     Meds & Orders: No orders of the defined types were placed in this encounter.   Orders Placed This Encounter  Procedures   Ambulatory referral to Physical Medicine Rehab    Follow-up: Return for Left L5 transforaminal epidural steroid injection.   Procedures: No procedures performed  Clinical History: MRI LUMBAR SPINE WITHOUT CONTRAST   TECHNIQUE:  Multiplanar, multisequence MR imaging of the lumbar spine was  performed. No intravenous contrast was administered.   COMPARISON: CT abdomen pelvis dated September 08, 2018. MRI lumbar  spine dated June 18, 2013.   FINDINGS:  Segmentation: Standard.    Alignment: Unchanged trace retrolisthesis at L1-L2 and L2-L3.  Unchanged fused trace anterolisthesis at L4-L5.   Vertebrae: Prior L3-L5 PLIF with solid osseous fusion. No fracture,  evidence of discitis, or focal bone lesion. Degenerative endplate  marrow edema at L2-L3.   Conus medullaris and cauda equina: Conus extends to the T12-L1  level. Conus and cauda equina appear normal.   Paraspinal and other soft tissues: Large left renal cyst again  noted. Sigmoid diverticulosis.   Disc levels:   T12-L1: Increased disc bulging with superimposed right-sided disc  protrusion. New mild to moderate right lateral recess and  neuroforaminal stenosis. No spinal canal or left neuroforaminal  stenosis.   L1-L2: Progressive disc bulging with new superimposed small left  subarticular disc protrusion. New mild spinal canal stenosis and  progressive now moderate left and mild right lateral recess  stenosis. No neuroforaminal stenosis.   L2-L3: Progressive disc bulging and posterior element hypertrophy  with progressive now severe spinal canal and left greater than right  lateral recess stenosis. New mild to moderate left neuroforaminal  stenosis. No right neuroforaminal stenosis.   L3-L4: Prior PLIF. No residual stenosis.   L4-L5: Prior PLIF. No residual stenosis.   L5-S1: Unchanged small shallow broad-based posterior disc protrusion  and mild bilateral facet arthropathy. No stenosis.   IMPRESSION:  1. Prior L3-L5 PLIF with progressive adjacent segment degenerative  disc disease at L2-L3, now with severe spinal canal stenosis.  2. Progressive degenerative disc disease at T12-L1 and L1-L2 as  described above.    Electronically Signed  By: Titus Dubin M.D.  On: 03/06/2019 17:10   She reports that she has never smoked. She has never used smokeless tobacco. No results for input(s): "HGBA1C", "LABURIC" in the last 8760 hours.  Objective:  VS:  HT:    WT:   BMI:     BP:(!) 169/69   HR:67bpm  TEMP: ( )  RESP:  Physical Exam Vitals and nursing note reviewed.  HENT:     Head: Normocephalic and atraumatic.     Right Ear: External ear normal.     Left Ear: External ear normal.     Nose: Nose normal.     Mouth/Throat:     Mouth: Mucous membranes are moist.  Eyes:     Extraocular Movements: Extraocular movements intact.  Cardiovascular:     Rate and Rhythm: Normal rate.     Pulses: Normal pulses.  Pulmonary:     Effort: Pulmonary effort is normal.  Abdominal:     General: Abdomen is flat. There is no distension.  Musculoskeletal:        General: Tenderness present.     Cervical back: Normal range of motion.     Comments: Pt is slow to rise from seated position to standing. Good lumbar range of motion. Strong distal strength without clonus, no pain upon palpation of greater trochanters. Sensation intact bilaterally. Ambulates with walker, gait unsteady.   Skin:    General: Skin is warm and dry.     Capillary Refill: Capillary refill takes less than 2 seconds.  Neurological:     General: No focal deficit present.     Mental Status: She is alert  and oriented to person, place, and time.  Psychiatric:        Mood and Affect: Mood normal.        Behavior: Behavior normal.     Ortho Exam  Imaging: No results found.  Past Medical/Family/Surgical/Social History: Medications & Allergies reviewed per EMR, new medications updated. Patient Active Problem List   Diagnosis Date Noted   Chronic midline low back pain 02/28/2019   Primary osteoarthritis of right hip 07/08/2015   Primary osteoarthritis of left hip 12/24/2014   S/P total hip arthroplasty 12/24/2014   Pre-operative cardiovascular examination 11/28/2014   Nausea with vomiting 07/29/2013   UTI (urinary tract infection) 07/29/2013   Bradycardia 07/29/2013   Status post lumbar laminectomy 07/29/2013   Syncope 07/25/2013   Ejection fraction    Otitis externa 12/16/2011   Neuropathic pain of lower  extremity 12/16/2011   Nerve entrapment syndrome of lower extremity    Hyponatremia 06/21/2011   Insomnia 06/21/2011   Peripheral vascular disease, unspecified ([place])    Coronary atherosclerosis of native coronary artery    Carotid artery disease ([place])    HYPERLIPIDEMIA-MIXED 05/23/2009   HTN (hypertension) 05/23/2009   Past Medical History:  Diagnosis Date   Anxiety    Arthritis    Carotid artery disease ([place])    with an occluded right vertebral artery, otherwise nonobstructive carotid artery disease   Coronary atherosclerosis of native coronary artery    Status post stent to right coronary 2004, catheterization 2005 nonobstructive CAD, normal LV function, Cardiolite study 2008 negative for ischemia. Residual moderate ostial RCA disease. Stress testing 2012 negative for ischemia ejection fraction 78%   Cough    Depression    takes Cymbalta daily   Diverticulosis    GERD (gastroesophageal reflux disease)    takes Omeprazole daily   History of blood transfusion    no abnormal reaction noted   History of bronchitis    > 76yr ago    Joint pain    Macular degeneration    dry   Nerve entrapment syndrome of lower extremity     status post prior back surgery and weakness in left leg   Other and unspecified hyperlipidemia    takes Pravastatin daily   Peripheral neuropathy    [place]    Minimal internal carotid artery disease   Seizures ([facility])    takes Tegretol daily;has only had 1 seizure and that was several yrs ago.   Unspecified essential hypertension    takes Lisinopril,Metoprolol,and Imdur daily   Family History  Problem Relation Age of Onset   Cancer Mother    Diabetes Mother    Kidney disease Sister    Hypertension Other    Diabetes Other    Past Surgical History:  Procedure Laterality Date   BACK SURGERY  10/14   CARDIAC CATHETERIZATION     nonobstructive coronary artery disease   CATARACT EXTRACTION     COLONOSCOPY      CORONARY ANGIOPLASTY  1998-1999   KIDNEY SURGERY  2010   1/4 of right kidney removed [facility]   KNEE ARTHROPLASTY Left 2004   PARTIAL HYSTERECTOMY     TOTAL HIP ARTHROPLASTY Left 12/24/2014   Procedure: TOTAL HIP ARTHROPLASTY;  Surgeon: PGarald Balding MD;  [facility] [facility]  Service: Orthopedics;  Laterality: Left;   TOTAL HIP ARTHROPLASTY Right 07/08/2015   TOTAL HIP ARTHROPLASTY Right 07/08/2015   Procedure: TOTAL HIP ARTHROPLASTY;  Surgeon: PGarald Balding MD;  [place] [facility]  Service:  Orthopedics;  Laterality: Right;   Social History   Occupational History   Not on file  Tobacco Use   Smoking status: Never   Smokeless tobacco: Never  Vaping Use   Vaping Use: Never used  Substance and Sexual Activity   Alcohol use: No    Alcohol/week: 0.0 standard drinks of alcohol   Drug use: No   Sexual activity: Not Currently    Birth control/protection: Surgical

## 2022-06-18 ENCOUNTER — Telehealth: Payer: Self-pay | Admitting: Physical Medicine and Rehabilitation

## 2022-06-18 NOTE — Telephone Encounter (Signed)
rescheduled

## 2022-06-18 NOTE — Telephone Encounter (Signed)
Patients daughter called asking to change the appointment they JUST made with Dr. Ernestina Patches. If she could get a call back the number is [phone]

## 2022-06-28 ENCOUNTER — Encounter: Payer: Self-pay | Admitting: Cardiology

## 2022-06-28 ENCOUNTER — Ambulatory Visit: Payer: Medicare HMO | Attending: Cardiology | Admitting: Cardiology

## 2022-06-28 VITALS — BP 118/58 | HR 70 | Ht 62.0 in | Wt 150.0 lb

## 2022-06-28 DIAGNOSIS — I2510 Atherosclerotic heart disease of native coronary artery without angina pectoris: Secondary | ICD-10-CM | POA: Diagnosis not present

## 2022-06-28 DIAGNOSIS — E782 Mixed hyperlipidemia: Secondary | ICD-10-CM | POA: Diagnosis not present

## 2022-06-28 DIAGNOSIS — I10 Essential (primary) hypertension: Secondary | ICD-10-CM | POA: Diagnosis not present

## 2022-06-28 MED ORDER — FUROSEMIDE 20 MG PO TABS: 20.0000 mg | ORAL_TABLET | Freq: Every day | ORAL | 3 refills | Status: DC

## 2022-06-28 NOTE — Progress Notes (Signed)
Clinical Summary Ms. Ainley is a 88 y.o.female seen today for follow up of the following medical problems.      1. CAD - history of stent to RCA in 2004. Nuclear stress negative for ischemia in 2008 and 2012 - echo 07/2013 LVEF 65-70%,  - 02/2016 lexiscan without significant ischemia     - no recent chest pains. No SOB/DOE - compliant with meds. Has not wanted to be on ASA.    2. HTN - she is compliant with meds   3. Hyperlipidemia  11/2018 TC 186 TG 245 HDL 43 LDL 94 05/2021 TC 182 TG 319 HDL  35 LDL 94   - labs followed by pcp  - compliant with pravstatin Past Medical History:  Diagnosis Date   Anxiety    Arthritis    Carotid artery disease ([place])    with an occluded right vertebral artery, otherwise nonobstructive carotid artery disease   Coronary atherosclerosis of native coronary artery    Status post stent to right coronary 2004, catheterization 2005 nonobstructive CAD, normal LV function, Cardiolite study 2008 negative for ischemia. Residual moderate ostial RCA disease. Stress testing 2012 negative for ischemia ejection fraction 78%   Cough    Depression    takes Cymbalta daily   Diverticulosis    GERD (gastroesophageal reflux disease)    takes Omeprazole daily   History of blood transfusion    no abnormal reaction noted   History of bronchitis    > 77yr ago    Joint pain    Macular degeneration    dry   Nerve entrapment syndrome of lower extremity     status post prior back surgery and weakness in left leg   Other and unspecified hyperlipidemia    takes Pravastatin daily   Peripheral neuropathy    Peripheral vascular disease, unspecified ([place]    Minimal internal carotid artery disease   Seizures ([facility])    takes Tegretol daily;has only had 1 seizure and that was several yrs ago.   Unspecified essential hypertension    takes Lisinopril,Metoprolol,and Imdur daily     Allergies  Allergen Reactions   Latex Itching and Rash     Current  Outpatient Medications  Medication Sig Dispense Refill   acetaminophen (TYLENOL) 325 MG tablet Take 650 mg by mouth daily.     furosemide (LASIX) 20 MG tablet Take 1 tablet (20 mg total) by mouth daily. 30 tablet 0   isosorbide mononitrate (IMDUR) 120 MG 24 hr tablet Take 120 mg by mouth daily. 1/2 tablet     Melatonin 10 MG TABS Take by mouth.     metoprolol (LOPRESSOR) 50 MG tablet Take 0.5 tablets (25 mg total) by mouth 2 (two) times daily.     Multiple Vitamins-Minerals (PRESERVISION AREDS PO) Take by mouth.     nitroGLYCERIN (NITROSTAT) 0.4 MG SL tablet DISSOLVE ONE TABLET UNDER THE TONGUE EVERY 5 MINUTES AS NEEDED FOR CHEST PAIN.  DO NOT EXCEED A TOTAL OF 3 DOSES IN 15 MINUTES 75 tablet 0   pantoprazole (PROTONIX) 40 MG tablet Take 1 tablet by mouth daily.     pravastatin (PRAVACHOL) 40 MG tablet Take 40 mg by mouth daily.     vitamin B-12 (CYANOCOBALAMIN) 100 MCG tablet Take 100 mcg by mouth daily.     VITAMIN D PO Take by mouth.     No current facility-administered medications for this visit.     Past Surgical History:  Procedure Laterality Date   BACK  SURGERY  10/14   CARDIAC CATHETERIZATION     nonobstructive coronary artery disease   CATARACT EXTRACTION     COLONOSCOPY     CORONARY ANGIOPLASTY  1998-1999   KIDNEY SURGERY  2010   1/4 of right kidney removed [facility]   KNEE ARTHROPLASTY Left 2004   PARTIAL HYSTERECTOMY     TOTAL HIP ARTHROPLASTY Left 12/24/2014   Procedure: TOTAL HIP ARTHROPLASTY;  Surgeon: Garald Balding, MD;  Location: [facility];  Service: Orthopedics;  Laterality: Left;   TOTAL HIP ARTHROPLASTY Right 07/08/2015   TOTAL HIP ARTHROPLASTY Right 07/08/2015   Procedure: TOTAL HIP ARTHROPLASTY;  Surgeon: Garald Balding, MD;  Location: [facility];  Service: Orthopedics;  Laterality: Right;     Allergies  Allergen Reactions   Latex Itching and Rash      Family History  Problem Relation Age of Onset   Cancer Mother    Diabetes Mother    Kidney disease  Sister    Hypertension Other    Diabetes Other      Social History Ms. Spano reports that she has never smoked. She has never used smokeless tobacco. Ms. Pancake reports no history of alcohol use.   Review of Systems CONSTITUTIONAL: No weight loss, fever, chills, weakness or fatigue.  HEENT: Eyes: No visual loss, blurred vision, double vision or yellow sclerae.No hearing loss, sneezing, congestion, runny nose or sore throat.  SKIN: No rash or itching.  CARDIOVASCULAR: per hpi RESPIRATORY: No shortness of breath, cough or sputum.  GASTROINTESTINAL: No anorexia, nausea, vomiting or diarrhea. No abdominal pain or blood.  GENITOURINARY: No burning on urination, no polyuria NEUROLOGICAL: No headache, dizziness, syncope, paralysis, ataxia, numbness or tingling in the extremities. No change in bowel or bladder control.  MUSCULOSKELETAL: No muscle, back pain, joint pain or stiffness.  LYMPHATICS: No enlarged nodes. No history of splenectomy.  PSYCHIATRIC: No history of depression or anxiety.  ENDOCRINOLOGIC: No reports of sweating, cold or heat intolerance. No polyuria or polydipsia.  Marland Kitchen   Physical Examination Today's Vitals   06/28/22 1522  BP: (!) 118/58  Pulse: 70  SpO2: 97%  Weight: 150 lb (68 kg)  Height: '5\' 2"'$  (1.575 m)   Body mass index is 27.44 kg/m.  Gen: resting comfortably, no acute distress HEENT: no scleral icterus, pupils equal round and reactive, no palptable cervical adenopathy,  CV: RRR, no m/rg, no jvd Resp: Clear to auscultation bilaterally GI: abdomen is soft, non-tender, non-distended, normal bowel sounds, no hepatosplenomegaly MSK: extremities are warm, no edema.  Skin: warm, no rash Neuro:  no focal deficits Psych: appropriate affect   Diagnostic Studies 07/2013 Carotid US Summary: Right: moderate intimal wall thickening CCA. Mild mixed plaque origin ICA. Left: mild sof tplaque distal CCA. Mild calcific plaque origin ICA with acoustic  shadowing. Bilateral: 1-395 ICA stenosis. Vertebral artery flow is antegrade. ICA/CCA ratio: R-0.59 L-1.0     02/2016 Lexiscan Nuclear stress Horizontal ST segment depression after Lexiscan injection. ST segment depression was noted during stress in the II, III, aVF, V5 and V6 leads. Nonspecific finding in setting of severe hypertension The study is normal. There are no perfusion defects consistent with prior infarction or current ischemia. This is a low risk study. The left ventricular ejection fraction is hyperdynamic (>65%).    Assessment and Plan   1. CAD -no symptoms, continue current meds - she has consistently turned down being on a daily aspirin.    2. HTN - , in general conservative target for her is  reasonable given advanced age -at goal today, continue current meds   3. Hyperlpidemia -continue current meds, request pcp labs  F/u 6 months   Arnoldo Lenis, M.D

## 2022-06-28 NOTE — Patient Instructions (Signed)
Medication Instructions:  Your physician recommends that you continue on your current medications as directed. Please refer to the Current Medication list given to you today.   Labwork: None  Testing/Procedures: None  Follow-Up: Follow up in 6 months with Dr. Branch  Any Other Special Instructions Will Be Listed Below (If Applicable).     If you need a refill on your cardiac medications before your next appointment, please call your pharmacy.  

## 2022-06-28 NOTE — Addendum Note (Signed)
Addended by: Christella Scheuermann C on: 06/28/2022 04:10 PM   Modules accepted: Orders

## 2022-06-29 ENCOUNTER — Encounter: Payer: Self-pay | Admitting: Family Medicine

## 2022-06-30 ENCOUNTER — Encounter: Payer: Medicare HMO | Admitting: Physical Medicine and Rehabilitation

## 2022-07-08 ENCOUNTER — Ambulatory Visit: Payer: Self-pay

## 2022-07-08 ENCOUNTER — Ambulatory Visit: Payer: Medicare HMO | Admitting: Physical Medicine and Rehabilitation

## 2022-07-08 VITALS — BP 180/79 | HR 91

## 2022-07-08 DIAGNOSIS — M5416 Radiculopathy, lumbar region: Secondary | ICD-10-CM | POA: Diagnosis not present

## 2022-07-08 MED ORDER — METHYLPREDNISOLONE ACETATE 80 MG/ML IJ SUSP
80.0000 mg | Freq: Once | INTRAMUSCULAR | Status: AC
  Administered 2022-07-08: 80 mg

## 2022-07-08 NOTE — Progress Notes (Signed)
Numeric Pain Rating Scale and Functional Assessment Average Pain 8   In the last MONTH (on 0-10 scale) has pain interfered with the following?  1. General activity like being  able to carry out your everyday physical activities such as walking, climbing stairs, carrying groceries, or moving a chair?  Rating(9)   +Driver, -BT, -Dye Allergies.   Pain in back with movement. Pain in left leg in thigh. Tylenol for pain

## 2022-07-08 NOTE — Patient Instructions (Signed)

## 2022-07-19 NOTE — Progress Notes (Signed)
Carrie Gillespie - 88 y.o. female MRN [id number]  Date of birth: 1920/08/07  Office Visit Note: Visit Date: 07/08/2022 PCP: Curlene Labrum, MD Referred by: Curlene Labrum, MD  Subjective: Chief Complaint  Patient presents with   Lower Back - Pain   HPI:  Carrie Gillespie is a 88 y.o. female who comes in today at the request of Barnet Pall, FNP for planned Left L5-S1 Lumbar Transforaminal epidural steroid injection with fluoroscopic guidance.  The patient has failed conservative care including home exercise, medications, time and activity modification.  This injection will be diagnostic and hopefully therapeutic.  Please see requesting physician notes for further details and justification.   ROS Otherwise per HPI.  Assessment & Plan: Visit Diagnoses:    ICD-10-CM   1. Lumbar radiculopathy  M54.16 XR C-ARM NO REPORT    Epidural Steroid injection    methylPREDNISolone acetate (DEPO-MEDROL) injection 80 mg      Plan: No additional findings.   Meds & Orders:  Meds ordered this encounter  Medications   methylPREDNISolone acetate (DEPO-MEDROL) injection 80 mg    Orders Placed This Encounter  Procedures   XR C-ARM NO REPORT   Epidural Steroid injection    Follow-up: Return for visit to requesting provider as needed.   Procedures: No procedures performed  Lumbosacral Transforaminal Epidural Steroid Injection - Sub-Pedicular Approach with Fluoroscopic Guidance  Patient: Carrie Gillespie      Date of Birth: 1920/02/05 MRN: [id number] PCP: Curlene Labrum, MD      Visit Date: 07/08/2022   Universal Protocol:    Date/Time: 07/08/2022  Consent Given By: the patient  Position: PRONE  Additional Comments: Vital signs were monitored before and after the procedure. Patient was prepped and draped in the usual sterile fashion. The correct patient, procedure, and site was verified.   Injection Procedure Details:   Procedure diagnoses: Lumbar radiculopathy [M54.16]     Meds Administered:  Meds ordered this encounter  Medications   methylPREDNISolone acetate (DEPO-MEDROL) injection 80 mg    Laterality: Left  Location/Site: L5  Needle:5.0 in., 22 ga.  Short bevel or Quincke spinal needle  Needle Placement: Transforaminal  Findings:    -Comments: Excellent flow of contrast along the nerve, nerve root and into the epidural space.  Procedure Details: After squaring off the end-plates to get a true AP view, the C-arm was positioned so that an oblique view of the foramen as noted above was visualized. The target area is just inferior to the "nose of the scotty dog" or sub pedicular. The soft tissues overlying this structure were infiltrated with 2-3 ml. of 1% Lidocaine without Epinephrine.  The spinal needle was inserted toward the target using a "trajectory" view along the fluoroscope beam.  Under AP and lateral visualization, the needle was advanced so it did not puncture dura and was located close the 6 O'Clock position of the pedical in AP tracterory. Biplanar projections were used to confirm position. Aspiration was confirmed to be negative for CSF and/or blood. A 1-2 ml. volume of Isovue-250 was injected and flow of contrast was noted at each level. Radiographs were obtained for documentation purposes.   After attaining the desired flow of contrast documented above, a 0.5 to 1.0 ml test dose of 0.25% Marcaine was injected into each respective transforaminal space.  The patient was observed for 90 seconds post injection.  After no sensory deficits were reported, and normal lower extremity motor function was noted,   the above injectate  was administered so that equal amounts of the injectate were placed at each foramen (level) into the transforaminal epidural space.   Additional Comments:  The patient tolerated the procedure well Dressing: 2 x 2 sterile gauze and Band-Aid    Post-procedure details: Patient was observed during the  procedure. Post-procedure instructions were reviewed.  Patient left the clinic in stable condition.    Clinical History: MRI LUMBAR SPINE WITHOUT CONTRAST   TECHNIQUE:  Multiplanar, multisequence MR imaging of the lumbar spine was  performed. No intravenous contrast was administered.   COMPARISON: CT abdomen pelvis dated September 08, 2018. MRI lumbar  spine dated June 18, 2013.   FINDINGS:  Segmentation: Standard.   Alignment: Unchanged trace retrolisthesis at L1-L2 and L2-L3.  Unchanged fused trace anterolisthesis at L4-L5.   Vertebrae: Prior L3-L5 PLIF with solid osseous fusion. No fracture,  evidence of discitis, or focal bone lesion. Degenerative endplate  marrow edema at L2-L3.   Conus medullaris and cauda equina: Conus extends to the T12-L1  level. Conus and cauda equina appear normal.   Paraspinal and other soft tissues: Large left renal cyst again  noted. Sigmoid diverticulosis.   Disc levels:   T12-L1: Increased disc bulging with superimposed right-sided disc  protrusion. New mild to moderate right lateral recess and  neuroforaminal stenosis. No spinal canal or left neuroforaminal  stenosis.   L1-L2: Progressive disc bulging with new superimposed small left  subarticular disc protrusion. New mild spinal canal stenosis and  progressive now moderate left and mild right lateral recess  stenosis. No neuroforaminal stenosis.   L2-L3: Progressive disc bulging and posterior element hypertrophy  with progressive now severe spinal canal and left greater than right  lateral recess stenosis. New mild to moderate left neuroforaminal  stenosis. No right neuroforaminal stenosis.   L3-L4: Prior PLIF. No residual stenosis.   L4-L5: Prior PLIF. No residual stenosis.   L5-S1: Unchanged small shallow broad-based posterior disc protrusion  and mild bilateral facet arthropathy. No stenosis.   IMPRESSION:  1. Prior L3-L5 PLIF with progressive adjacent segment  degenerative  disc disease at L2-L3, now with severe spinal canal stenosis.  2. Progressive degenerative disc disease at T12-L1 and L1-L2 as  described above.    Electronically Signed  By: Titus Dubin M.D.  On: 03/06/2019 17:10     Objective:  VS:  HT:    WT:   BMI:     BP:(!) 180/79  HR:91bpm  TEMP: ( )  RESP:  Physical Exam Vitals and nursing note reviewed.  Constitutional:      General: She is not in acute distress.    Appearance: Normal appearance. She is not ill-appearing.  HENT:     Head: Normocephalic and atraumatic.     Right Ear: External ear normal.     Left Ear: External ear normal.  Eyes:     Extraocular Movements: Extraocular movements intact.  Cardiovascular:     Rate and Rhythm: Normal rate.     Pulses: Normal pulses.  Pulmonary:     Effort: Pulmonary effort is normal. No respiratory distress.  Abdominal:     General: There is no distension.     Palpations: Abdomen is soft.  Musculoskeletal:        General: Tenderness present.     Cervical back: Neck supple.     Right lower leg: No edema.     Left lower leg: No edema.     Comments: Patient has good distal strength with no pain over the greater  trochanters.  No clonus or focal weakness.  Skin:    Findings: No erythema, lesion or rash.  Neurological:     General: No focal deficit present.     Mental Status: She is alert and oriented to person, place, and time.     Sensory: No sensory deficit.     Motor: No weakness or abnormal muscle tone.     Coordination: Coordination normal.  Psychiatric:        Mood and Affect: Mood normal.        Behavior: Behavior normal.      Imaging: No results found.

## 2022-07-19 NOTE — Procedures (Signed)
Lumbosacral Transforaminal Epidural Steroid Injection - Sub-Pedicular Approach with Fluoroscopic Guidance  Patient: Carrie Gillespie      Date of Birth: 30-Jul-1920 MRN: [id number] PCP: Curlene Labrum, MD      Visit Date: 07/08/2022   Universal Protocol:    Date/Time: 07/08/2022  Consent Given By: the patient  Position: PRONE  Additional Comments: Vital signs were monitored before and after the procedure. Patient was prepped and draped in the usual sterile fashion. The correct patient, procedure, and site was verified.   Injection Procedure Details:   Procedure diagnoses: Lumbar radiculopathy [M54.16]    Meds Administered:  Meds ordered this encounter  Medications   methylPREDNISolone acetate (DEPO-MEDROL) injection 80 mg    Laterality: Left  Location/Site: L5  Needle:5.0 in., 22 ga.  Short bevel or Quincke spinal needle  Needle Placement: Transforaminal  Findings:    -Comments: Excellent flow of contrast along the nerve, nerve root and into the epidural space.  Procedure Details: After squaring off the end-plates to get a true AP view, the C-arm was positioned so that an oblique view of the foramen as noted above was visualized. The target area is just inferior to the "nose of the scotty dog" or sub pedicular. The soft tissues overlying this structure were infiltrated with 2-3 ml. of 1% Lidocaine without Epinephrine.  The spinal needle was inserted toward the target using a "trajectory" view along the fluoroscope beam.  Under AP and lateral visualization, the needle was advanced so it did not puncture dura and was located close the 6 O'Clock position of the pedical in AP tracterory. Biplanar projections were used to confirm position. Aspiration was confirmed to be negative for CSF and/or blood. A 1-2 ml. volume of Isovue-250 was injected and flow of contrast was noted at each level. Radiographs were obtained for documentation purposes.   After attaining the desired flow  of contrast documented above, a 0.5 to 1.0 ml test dose of 0.25% Marcaine was injected into each respective transforaminal space.  The patient was observed for 90 seconds post injection.  After no sensory deficits were reported, and normal lower extremity motor function was noted,   the above injectate was administered so that equal amounts of the injectate were placed at each foramen (level) into the transforaminal epidural space.   Additional Comments:  The patient tolerated the procedure well Dressing: 2 x 2 sterile gauze and Band-Aid    Post-procedure details: Patient was observed during the procedure. Post-procedure instructions were reviewed.  Patient left the clinic in stable condition.

## 2023-07-02 ENCOUNTER — Other Ambulatory Visit: Payer: Self-pay | Admitting: Cardiology

## 2023-07-30 ENCOUNTER — Other Ambulatory Visit: Payer: Self-pay | Admitting: Cardiology

## 2023-09-16 ENCOUNTER — Encounter: Payer: Self-pay | Admitting: Nurse Practitioner

## 2023-09-16 ENCOUNTER — Ambulatory Visit: Payer: Medicare HMO | Attending: Nurse Practitioner | Admitting: Nurse Practitioner

## 2023-09-16 VITALS — BP 142/79 | HR 67 | Ht 62.0 in | Wt 162.6 lb

## 2023-09-16 DIAGNOSIS — I10 Essential (primary) hypertension: Secondary | ICD-10-CM

## 2023-09-16 DIAGNOSIS — E785 Hyperlipidemia, unspecified: Secondary | ICD-10-CM

## 2023-09-16 DIAGNOSIS — I2510 Atherosclerotic heart disease of native coronary artery without angina pectoris: Secondary | ICD-10-CM

## 2023-09-16 MED ORDER — FUROSEMIDE 20 MG PO TABS: 20.0000 mg | ORAL_TABLET | Freq: Every day | ORAL | 1 refills | Status: DC

## 2023-09-16 NOTE — Progress Notes (Signed)
Cardiology Office Note:  .   Date:  09/16/2023  ID:  Carrie Gillespie, DOB May 07, 1920, MRN [id number] PCP: Juliette Alcide, MD  [facility] Providers Cardiologist:  Dina Rich, MD    History of Present Illness: .   Carrie Gillespie is a 89 y.o. female with a PMH of CAD, hyperlipidemia, hypertension, anxiety/depression, arthritis, macular degeneration, GERD, who resents today for scheduled follow-up.  Last seen by Dr. Dina Rich on June 28, 2022.  She was doing well at the time.  Today she presents for overdue follow-up.  She states she is doing well. Denies any changes to her health or acute cardiac complaints or issues. She says two of her three children help with her care. Does well living at home by herself. Denies any chest pain, shortness of breath, palpitations, syncope, presyncope, dizziness, orthopnea, PND, swelling or significant weight changes, acute bleeding, or claudication.  ROS: Negative. See HPI.  SH: One of 10 children, has 1 living younger brother. Retired Interior and spatial designer.   Studies Reviewed: Marland Kitchen    EKG: EKG Interpretation Date/Time:  Friday September 16 2023 11:18:41 EST Ventricular Rate:  67 PR Interval:  204 QRS Duration:  102 QT Interval:  404 QTC Calculation: 426 R Axis:   20  Text Interpretation: Normal sinus rhythm Normal ECG When compared with ECG of 08-Jul-2015 06:15, No significant change was found Confirmed by Carrie Gillespie [phone]) on 09/16/2023 11:23:31 AM   Lexiscan 02/2016:  Horizontal ST segment depression after Lexiscan injection. ST segment depression was noted during stress in the II, III, aVF, V5 and V6 leads. Nonspecific finding in setting of severe hypertension The study is normal. There are no perfusion defects consistent with prior infarction or current ischemia. This is a low risk study. The left ventricular ejection fraction is hyperdynamic (>65%).  Echo 07/2013:  Study Conclusions   - Left ventricle: The cavity size was  normal. Wall thickness    was increased in a pattern of moderate LVH. Systolic    function was vigorous. The estimated ejection fraction was    in the range of 65% to 70%. Wall motion was normal; there    were no regional wall motion abnormalities.  - Aortic valve: Sclerosis without stenosis. No significant    regurgitation.  - Mitral valve: Mildly calcified annulus.  - Left atrium: The atrium was mildly dilated.  - Inferior vena cava: The vessel was normal in size; the    respirophasic diameter changes were in the normal range (=    50%); findings are consistent with normal central venous    pressure. Risk Assessment/Calculations:    HYPERTENSION CONTROL Vitals:   09/16/23 1107 09/16/23 1115 09/16/23 1125  BP: (!) 180/88 (!) 178/72 (!) 142/79    The patient's blood pressure is elevated above target today.  In order to address the patient's elevated BP: Blood pressure will be monitored at home to determine if medication changes need to be made.; Follow up with general cardiology has been recommended.          Physical Exam:   VS:  BP (!) 142/79 (BP Location: Right Arm, Patient Position: Sitting, Cuff Size: Normal)   Pulse 67   Ht 5\' 2"  (1.575 m)   Wt 162 lb 9.6 oz (73.8 kg)   SpO2 96%   BMI 29.74 kg/m    Wt Readings from Last 3 Encounters:  09/16/23 162 lb 9.6 oz (73.8 kg)  06/28/22 150 lb (68 kg)  08/26/21 159 lb (72.1 kg)  GEN: Well nourished, well developed in no acute distress NECK: No JVD; No carotid bruits CARDIAC: S1/S2, RRR, no murmurs, rubs, gallops RESPIRATORY:  Clear to auscultation without rales, wheezing or rhonchi  ABDOMEN: Soft, non-tender, non-distended EXTREMITIES:  No edema; No deformity   ASSESSMENT AND PLAN: .    CAD Stable with no anginal symptoms. No indication for ischemic evaluation. Continue current medication regimen. Heart healthy diet encouraged. Care and ED precautions discussed.   HTN BP elevated on arrival, repeat BP 142/79.  Discussed to monitor BP at home at least 2 hours after medications and sitting for 5-10 minutes. With her advanced age, hesitant to be aggressive with BP control and daughter reports BP well controlled prior to today's office visit. No medication changes at this time. Heart healthy diet encouraged.   HLD No recent labs on file. Will request labs from PCP. Continue current medication regimen. Heart healthy diet encouraged.       Dispo: Follow-up with Dr. Dina Rich or APP in 6 months or sooner if anything changes.   Signed, Carrie Dory, NP

## 2023-09-16 NOTE — Patient Instructions (Addendum)

## 2023-09-19 ENCOUNTER — Encounter: Payer: Self-pay | Admitting: Cardiology

## 2024-03-16 ENCOUNTER — Encounter: Payer: Self-pay | Admitting: *Deleted

## 2024-03-16 ENCOUNTER — Ambulatory Visit: Payer: Medicare HMO | Attending: Cardiology | Admitting: Cardiology

## 2024-03-16 ENCOUNTER — Encounter: Payer: Self-pay | Admitting: Cardiology

## 2024-03-16 VITALS — BP 145/60 | HR 61 | Ht 64.0 in | Wt 155.2 lb

## 2024-03-16 DIAGNOSIS — R42 Dizziness and giddiness: Secondary | ICD-10-CM | POA: Diagnosis not present

## 2024-03-16 DIAGNOSIS — E782 Mixed hyperlipidemia: Secondary | ICD-10-CM

## 2024-03-16 DIAGNOSIS — I10 Essential (primary) hypertension: Secondary | ICD-10-CM | POA: Diagnosis not present

## 2024-03-16 DIAGNOSIS — I2510 Atherosclerotic heart disease of native coronary artery without angina pectoris: Secondary | ICD-10-CM

## 2024-03-16 MED ORDER — ISOSORBIDE MONONITRATE ER 30 MG PO TB24: 30.0000 mg | ORAL_TABLET | Freq: Every day | ORAL | 6 refills | Status: AC

## 2024-03-16 MED ORDER — NITROGLYCERIN 0.4 MG SL SUBL: 0.4000 mg | SUBLINGUAL_TABLET | SUBLINGUAL | 3 refills | Status: AC | PRN

## 2024-03-16 NOTE — Patient Instructions (Signed)
 Medication Instructions:   Decrease Imdur  to 30mg  daily  Continue all other medications.     Labwork:  none  Testing/Procedures:  none  Follow-Up:  6 months   Any Other Special Instructions Will Be Listed Below (If Applicable).   If you need a refill on your cardiac medications before your next appointment, please call your pharmacy.

## 2024-03-16 NOTE — Progress Notes (Signed)
 Clinical Summary Ms. Grissom is a 89 y.o.female seen today for follow up of the following medical problems.      1. CAD - history of stent to RCA in 2004. Nuclear stress negative for ischemia in 2008 and 2012 - echo 07/2013 LVEF 65-70%,  - 02/2016 lexiscan  without significant ischemia     -no chest pains. No SOB/DOE -she is compliant with meds. Has wanted to be on ASA  2. HTN - compliant with meds   3. Hyperlipidemia  11/2018 TC 186 TG 245 HDL 43 LDL 94 05/2021 TC 182 TG 319 HDL  35 LDL 94   - labs followed by pcp  - compliant with pravstatin  4. Syncope - admit 12/2023 to [facility] with syncope - from ER notes abnormal orthostatics. Family reported poor oral intake. Also on lasix  at home - some ongoing dizziness at times, falls but no syncope - working to increase hydration Past Medical History:  Diagnosis Date   Anxiety    Arthritis    Carotid artery disease (HCC)    with an occluded right vertebral artery, otherwise nonobstructive carotid artery disease   Coronary atherosclerosis of native coronary artery    Status post stent to right coronary 2004, catheterization 2005 nonobstructive CAD, normal LV function, Cardiolite study 2008 negative for ischemia. Residual moderate ostial RCA disease. Stress testing 2012 negative for ischemia ejection fraction 78%   Cough    Depression    takes Cymbalta  daily   Diverticulosis    GERD (gastroesophageal reflux disease)    takes Omeprazole daily   History of blood transfusion    no abnormal reaction noted   History of bronchitis    > 55yrs ago    Joint pain    Macular degeneration    dry   Nerve entrapment syndrome of lower extremity     status post prior back surgery and weakness in left leg   Other and unspecified hyperlipidemia    takes Pravastatin  daily   Peripheral neuropathy    Peripheral vascular disease, unspecified (HCC)    Minimal internal carotid artery disease   Seizures (HCC)    takes Tegretol   daily;has only had 1 seizure and that was several yrs ago.   Unspecified essential hypertension    takes Lisinopril ,Metoprolol ,and Imdur  daily     Allergies  Allergen Reactions   Latex Itching and Rash     Current Outpatient Medications  Medication Sig Dispense Refill   acetaminophen  (TYLENOL ) 500 MG tablet Take 500 mg by mouth every morning.     acetaminophen  (TYLENOL ) 650 MG CR tablet Take 650 mg by mouth at bedtime.     famotidine (PEPCID) 20 MG tablet Take 20 mg by mouth every morning.     metoprolol  (LOPRESSOR ) 50 MG tablet Take 0.5 tablets (25 mg total) by mouth 2 (two) times daily.     pantoprazole  (PROTONIX ) 40 MG tablet Take 1 tablet by mouth at bedtime.     pravastatin  (PRAVACHOL ) 40 MG tablet Take 40 mg by mouth daily.     VITAMIN D PO Take by mouth daily.     isosorbide  mononitrate (IMDUR ) 30 MG 24 hr tablet Take 1 tablet (30 mg total) by mouth daily. 30 tablet 6   nitroGLYCERIN  (NITROSTAT ) 0.4 MG SL tablet Place 1 tablet (0.4 mg total) under the tongue every 5 (five) minutes as needed for chest pain (if no relief after 3rd dose proceed to ED or call 911.). 25 tablet 3   No  current facility-administered medications for this visit.     Past Surgical History:  Procedure Laterality Date   BACK SURGERY  10/14   CARDIAC CATHETERIZATION     nonobstructive coronary artery disease   CATARACT EXTRACTION     COLONOSCOPY     CORONARY ANGIOPLASTY  1998-1999   KIDNEY SURGERY  2010   1/4 of right kidney removed [facility]   KNEE ARTHROPLASTY Left 2004   PARTIAL HYSTERECTOMY     TOTAL HIP ARTHROPLASTY Left 12/24/2014   Procedure: TOTAL HIP ARTHROPLASTY;  Surgeon: Shirlee Dotter, MD;  Location: [facility] OR;  Service: Orthopedics;  Laterality: Left;   TOTAL HIP ARTHROPLASTY Right 07/08/2015   TOTAL HIP ARTHROPLASTY Right 07/08/2015   Procedure: TOTAL HIP ARTHROPLASTY;  Surgeon: Shirlee Dotter, MD;  Location: [facility] OR;  Service: Orthopedics;  Laterality: Right;     Allergies   Allergen Reactions   Latex Itching and Rash      Family History  Problem Relation Age of Onset   Cancer Mother    Diabetes Mother    Kidney disease Sister    Hypertension Other    Diabetes Other      Social History Ms. Panico reports that she has never smoked. She has never used smokeless tobacco. Ms. Brau reports no history of alcohol  use.    Physical Examination Today's Vitals   03/16/24 1121 03/16/24 1144  BP: (!) 160/72 (!) 145/60  Pulse: 61   SpO2: 99%   Weight: 155 lb 3.2 oz (70.4 kg)   Height: 5' 4 (1.626 m)    Body mass index is 26.64 kg/m.  Gen: resting comfortably, no acute distress HEENT: no scleral icterus, pupils equal round and reactive, no palptable cervical adenopathy,  CV: RRR, no mrg, no jvd Resp: Clear to auscultation bilaterally GI: abdomen is soft, non-tender, non-distended, normal bowel sounds, no hepatosplenomegaly MSK: extremities are warm, no edema.  Skin: warm, no rash Neuro:  no focal deficits Psych: appropriate affect   Diagnostic Studies  07/2013 Carotid US  Summary: Right: moderate intimal wall thickening CCA. Mild mixed plaque origin ICA. Left: mild sof tplaque distal CCA. Mild calcific plaque origin ICA with acoustic shadowing. Bilateral: 1-395 ICA stenosis. Vertebral artery flow is antegrade. ICA/CCA ratio: R-0.59 L-1.0     02/2016 Lexiscan  Nuclear stress Horizontal ST segment depression after Lexiscan  injection. ST segment depression was noted during stress in the II, III, aVF, V5 and V6 leads. Nonspecific finding in setting of severe hypertension The study is normal. There are no perfusion defects consistent with prior infarction or current ischemia. This is a low risk study. The left ventricular ejection fraction is hyperdynamic (>65%).     Assessment and Plan   1. CAD - she has consistently turned down being on a daily aspirin .  - no symptoms, continue current meds   2. HTN - , in general conservative target  for her is reasonable given advanced age and dizziness - continue current therapy   3. Hyperlpidemia -request pcp labs  4. Dizziness/syncope - working on increased hydration - we will lower her imdur  to 30mg  daily.     Laurann Pollock, M.D.

## 2024-03-19 ENCOUNTER — Encounter: Payer: Self-pay | Admitting: Family Medicine

## 2024-03-23 ENCOUNTER — Ambulatory Visit: Payer: Self-pay | Admitting: Nurse Practitioner

## 2024-09-13 ENCOUNTER — Ambulatory Visit: Admitting: Cardiology

## 2024-09-21 ENCOUNTER — Ambulatory Visit: Admitting: Cardiology
